# Patient Record
Sex: Male | Born: 1970 | Race: White | Hispanic: No | Marital: Married | Smoking: Former smoker
Health system: Southern US, Community
[De-identification: ages and names within clinical notes are randomized; demographics above are authoritative.]

## PROBLEM LIST (undated history)

## (undated) DIAGNOSIS — R569 Unspecified convulsions: Secondary | ICD-10-CM

## (undated) DIAGNOSIS — E669 Obesity, unspecified: Secondary | ICD-10-CM

## (undated) DIAGNOSIS — I10 Essential (primary) hypertension: Secondary | ICD-10-CM

---

## 2011-04-18 ENCOUNTER — Ambulatory Visit: Payer: Self-pay | Admitting: Family Medicine

## 2012-10-25 ENCOUNTER — Ambulatory Visit: Payer: Self-pay | Admitting: Family Medicine

## 2013-05-27 ENCOUNTER — Ambulatory Visit: Payer: Self-pay | Admitting: Family Medicine

## 2013-05-28 ENCOUNTER — Ambulatory Visit: Payer: Self-pay | Admitting: Family Medicine

## 2013-06-12 ENCOUNTER — Ambulatory Visit: Payer: Self-pay | Admitting: Family Medicine

## 2013-06-12 HISTORY — PX: ROTATOR CUFF REPAIR: SHX139

## 2013-07-14 ENCOUNTER — Ambulatory Visit: Payer: Self-pay | Admitting: Orthopedic Surgery

## 2013-07-14 LAB — APTT: Activated PTT: 29.8 secs (ref 23.6–35.9)

## 2013-07-14 LAB — BASIC METABOLIC PANEL
Anion Gap: 5 — ABNORMAL LOW (ref 7–16)
BUN: 9 mg/dL (ref 7–18)
CALCIUM: 8.7 mg/dL (ref 8.5–10.1)
Chloride: 107 mmol/L (ref 98–107)
Co2: 25 mmol/L (ref 21–32)
Creatinine: 0.98 mg/dL (ref 0.60–1.30)
EGFR (African American): >60
Glucose: 125 mg/dL — ABNORMAL HIGH (ref 65–99)
Osmolality: 274 (ref 275–301)
Potassium: 3.8 mmol/L (ref 3.5–5.1)
Sodium: 137 mmol/L (ref 136–145)

## 2013-07-14 LAB — CBC
HCT: 43.1 % (ref 40.0–52.0)
HGB: 14.4 g/dL (ref 13.0–18.0)
MCH: 31.2 pg (ref 26.0–34.0)
MCHC: 33.4 g/dL (ref 32.0–36.0)
MCV: 93 fL (ref 80–100)
Platelet: 337 10*3/uL (ref 150–440)
RBC: 4.62 10*6/uL (ref 4.40–5.90)
RDW: 14.3 % (ref 11.5–14.5)
WBC: 11.5 10*3/uL — ABNORMAL HIGH (ref 3.8–10.6)

## 2013-07-14 LAB — SEDIMENTATION RATE: ERYTHROCYTE SED RATE: 4 mm/h (ref 0–15)

## 2013-07-14 LAB — PROTIME-INR
INR: 0.8
PROTHROMBIN TIME: 11.5 s (ref 11.5–14.7)

## 2013-07-16 ENCOUNTER — Ambulatory Visit: Payer: Self-pay | Admitting: Orthopedic Surgery

## 2014-10-03 NOTE — Op Note (Signed)
PATIENT NAME:  Todd Villa, Todd Villa MR#:  [id number] DATE OF BIRTH:  [birth year]  DATE OF PROCEDURE:  07/16/2013  PREOPERATIVE DIAGNOSES: Left shoulder impingement, acromioclavicular joint arthrosis, and supraspinatus rotator cuff tear.   POSTOPERATIVE DIAGNOSES: Left shoulder impingement, acromioclavicular joint arthrosis, and supraspinatus rotator cuff tear.   PROCEDURES: Arthroscopic left shoulder subacromial decompression, distal clavicle excision, and mini open rotator cuff repair.   ANESTHESIA: General with interscalene block.   SURGEON: [place], M.D.   ESTIMATED BLOOD LOSS: Minimal.   COMPLICATIONS: None.   IMPLANTS: ArthroCare Opus Magnum 2 anchors x2, and Opus Magnum M anchors x2.   INDICATIONS FOR PROCEDURE: The patient is a 44 year old male who sustained an injury to his left shoulder. He had failed to improve with nonoperative management including physical therapy, corticosteroid injection, and prescription NSAIDs. His MRI was suggestive of a partial- versus full-thickness tear involving the supraspinatus. Given his failure to improve, the decision was made to proceed with arthroscopic evaluation and mini open rotator cuff repair.   The patient was made aware of the risks and benefits of surgery. He understands the risks include infection, bleeding, nerve or blood vessel injury, shoulder stiffness, persistent pain, re-tear of the rotator cuff, implant failure, and the need for further surgery. Medical risks include but are not limited to DVT and pulmonary embolism, myocardial infarction, stroke, pneumonia, respiratory failure and death. The patient understood these risks and wished to proceed.   PROCEDURE NOTE: The patient was met in the preoperative area. His wife was at the bedside. I marked the left shoulder with the word "yes" according to the hospital's right-site protocol after verbally confirming with the patient that this was the correct site of surgery. This was also  confirmed using my hospital notes and the radiographic studies. The patient's history and physical was updated.   He was brought to the operating room where he underwent a left interscalene block and then underwent general anesthesia. He was positioned in a beach chair position and a Spider arm holder was used for this case. Examination under anesthesia demonstrated full range of motion with no instability.   The patient was prepped and draped in a sterile fashion.   All bony prominences were adequately padded.   A timeout was performed to verify the patient's name, date of birth, medical record number, correct site of surgery, and correct procedure to be performed. It was also used to verify the patient had received antibiotics and all appropriate instruments, implants, and radiographic studies were available in the room. Once all in attendance were in agreement, the case began.   The patient's bony landmarks were drawn out with a surgical marker along with the proposed incisions. These were pre-injected with 1% lidocaine plain. An 11-blade was used to establish a posterior portal. The arthroscope was placed into the glenohumeral joint through this portal. An anterior portal was then established using the 18-gauge spinal needle for localization. Again, the 11-blade was used to make the portal incision and a 5.75 mm arthroscopic cannula was placed through the anterior portal.   A full diagnostic examination of the glenohumeral joint was undertaken. Findings on arthroscopy included fraying of the superior labrum with a stable biceps anchor. There was no tear of the biceps. There was a partial tear with fraying of some fibers of the subscapularis. There was a high-grade partial-thickness tear of the supraspinatus. The infraspinatus and teres minor were intact. The glenoid and humeral head chondral surfaces were intact and without evidence of chondral injury. The  labrum was intact. There were no HAGL  lesions or loose bodies in the inferior recess.   The superior labrum was debrided as was the subscapularis with a 4-0 resector shaver blade.   Then the attention was turned to debriding the frayed edges of the supraspinatus. An 18-gauge spinal needle was then placed through the tear and a 0 PDS suture was placed through the spinal needle to mark the tear to be identified on the bursal side. The arthroscope was then removed after the above-noted debridement.   The arthroscope was placed into the subacromial space. Significant bursitis was encountered. A lateral portal was established, again using an 18-gauge spinal needle for localization. A 4-0 resector shaver blade and a 90-degree ArthroCare wand were then used to debride the bursitis. The 0 PDS was then identified from the bursal side. The rotator cuff tear was debrided from the bursal side. A 5.5 mm resector shaver blade was then placed through the lateral portal. A subacromial decompression was performed. The 5.5 mm resector shaver blade was also used to perform a distal clavicle excision from the anterior portal. Once the distal clavicle excision and subacromial decompression were completed, the subacromial space was copiously irrigated and bony debris removed. An Community education officer stitch was then placed into the lateral aspect of the supraspinatus tear. All arthroscopic instruments were then removed.   A 15-blade was used to make a saber-type incision along the lateral border of the acromion. The subcutaneous tissues were dissected using Metzenbaum scissor and pick-up. The deltoid fascia was then identified and split in line with its fibers to reveal the underlying greater tuberosity and rotator cuff tear. A Smart stitch was brought out through the deltoid split. A second Smart stitch was then placed in the lateral border of the rotator cuff. Two ArthroCare Opus Magnum M  anchors were then placed at the articular margin with the humeral head for medial  row fixation. A single suture from each of these anchors was passed medially through the rotator cuff using a First-Pass. Once the 4 suture limbs were through the rotator cuff, they were clamped with a hemostat to be tied down later.   Two Magnum 2 anchors were loaded with the Smart stitch sutures and tightened to allow for excellent approximation of the lateral border of the rotator cuff along the greater tuberosity. Once the Magnum 2 anchors were in place and the sutures adequately tensioned, the Magnum M anchors were then tied down for double-row fixation. The wound was copiously irrigated. External pictures of the rotator cuff repair were then taken. The arthroscope was then placed back through the posterior portal and internal pictures of the rotator cuff tear were also taken.   The glenohumeral joint and saber incision were then copiously irrigated. The deltoid fascia was closed with a 0 interrupted suture. The subcutaneous tissue was closed with 2-0 Vicryl and the skin approximated with a running 4-0 Monocryl. The 3 portal incisions were closed with 4-0 nylon. Steri-Strips were applied. A subacromial injection of 0.25% Marcaine plain was then injected. The dry sterile dressing was applied along with leads for a TENS unit and Polar Care sleeve. The patient was placed in an abduction sling. He was then awakened and brought to the PACU in stable condition.   I was scrubbed and present for the entire case and all sharp and instrument counts were correct at the conclusion of the case. I spoke with the patient's wife in the postop consultation room to let her know that  the case had gone without complication and that her husband was stable in recovery room.    ____________________________ Timoteo Gaul, MD [id number] D: 07/23/2013 21:52:22 ET T: 07/23/2013 22:10:41 ET JOB#: [id number]  cc: Timoteo Gaul, MD, <Dictator> Timoteo Gaul MD ELECTRONICALLY SIGNED 07/24/2013 10:43

## 2015-11-26 ENCOUNTER — Ambulatory Visit: Payer: Self-pay | Admitting: Internal Medicine

## 2015-11-30 ENCOUNTER — Encounter: Payer: Self-pay | Admitting: Internal Medicine

## 2015-11-30 ENCOUNTER — Ambulatory Visit (INDEPENDENT_AMBULATORY_CARE_PROVIDER_SITE_OTHER): Payer: 59 | Admitting: Internal Medicine

## 2015-11-30 VITALS — BP 126/78 | HR 75 | Temp 98.4°F | Ht 74.5 in | Wt 332.0 lb

## 2015-11-30 DIAGNOSIS — F17200 Nicotine dependence, unspecified, uncomplicated: Secondary | ICD-10-CM

## 2015-11-30 DIAGNOSIS — Z720 Tobacco use: Secondary | ICD-10-CM

## 2015-11-30 DIAGNOSIS — M25512 Pain in left shoulder: Secondary | ICD-10-CM | POA: Diagnosis not present

## 2015-11-30 DIAGNOSIS — H938X3 Other specified disorders of ear, bilateral: Secondary | ICD-10-CM

## 2015-11-30 MED ORDER — VARENICLINE TARTRATE 0.5 MG X 11 & 1 MG X 42 PO MISC: ORAL | Status: DC

## 2015-11-30 MED ORDER — VARENICLINE TARTRATE 1 MG PO TABS: 1.0000 mg | ORAL_TABLET | Freq: Two times a day (BID) | ORAL | Status: DC

## 2015-11-30 NOTE — Patient Instructions (Signed)
Smoking Cessation, Tips for Success If you are ready to quit smoking, congratulations! You have chosen to help yourself be healthier. Cigarettes bring nicotine, tar, carbon monoxide, and other irritants into your body. Your lungs, heart, and blood vessels will be able to work better without these poisons. There are many different ways to quit smoking. Nicotine gum, nicotine patches, a nicotine inhaler, or nicotine nasal spray can help with physical craving. Hypnosis, support groups, and medicines help break the habit of smoking. WHAT THINGS CAN I DO TO MAKE QUITTING EASIER?  Here are some tips to help you quit for good:  Pick a date when you will quit smoking completely. Tell all of your friends and family about your plan to quit on that date.  Do not try to slowly cut down on the number of cigarettes you are smoking. Pick a quit date and quit smoking completely starting on that day.  Throw away all cigarettes.   Clean and remove all ashtrays from your home, work, and car.  On a card, write down your reasons for quitting. Carry the card with you and read it when you get the urge to smoke.  Cleanse your body of nicotine. Drink enough water and fluids to keep your urine clear or pale yellow. Do this after quitting to flush the nicotine from your body.  Learn to predict your moods. Do not let a bad situation be your excuse to have a cigarette. Some situations in your life might tempt you into wanting a cigarette.  Never have "just one" cigarette. It leads to wanting another and another. Remind yourself of your decision to quit.  Change habits associated with smoking. If you smoked while driving or when feeling stressed, try other activities to replace smoking. Stand up when drinking your coffee. Brush your teeth after eating. Sit in a different chair when you read the paper. Avoid alcohol while trying to quit, and try to drink fewer caffeinated beverages. Alcohol and caffeine may urge you to  smoke.  Avoid foods and drinks that can trigger a desire to smoke, such as sugary or spicy foods and alcohol.  Ask people who smoke not to smoke around you.  Have something planned to do right after eating or having a cup of coffee. For example, plan to take a walk or exercise.  Try a relaxation exercise to calm you down and decrease your stress. Remember, you may be tense and nervous for the first 2 weeks after you quit, but this will pass.  Find new activities to keep your hands busy. Play with a pen, coin, or rubber band. Doodle or draw things on paper.  Brush your teeth right after eating. This will help cut down on the craving for the taste of tobacco after meals. You can also try mouthwash.   Use oral substitutes in place of cigarettes. Try using lemon drops, carrots, cinnamon sticks, or chewing gum. Keep them handy so they are available when you have the urge to smoke.  When you have the urge to smoke, try deep breathing.  Designate your home as a nonsmoking area.  If you are a heavy smoker, ask your health care provider about a prescription for nicotine chewing gum. It can ease your withdrawal from nicotine.  Reward yourself. Set aside the cigarette money you save and buy yourself something nice.  Look for support from others. Join a support group or smoking cessation program. Ask someone at home or at work to help you with your plan   to quit smoking.  Always ask yourself, "Do I need this cigarette or is this just a reflex?" Tell yourself, "Today, I choose not to smoke," or "I do not want to smoke." You are reminding yourself of your decision to quit.  Do not replace cigarette smoking with electronic cigarettes (commonly called e-cigarettes). The safety of e-cigarettes is unknown, and some may contain harmful chemicals.  If you relapse, do not give up! Plan ahead and think about what you will do the next time you get the urge to smoke. HOW WILL I FEEL WHEN I QUIT SMOKING? You  may have symptoms of withdrawal because your body is used to nicotine (the addictive substance in cigarettes). You may crave cigarettes, be irritable, feel very hungry, cough often, get headaches, or have difficulty concentrating. The withdrawal symptoms are only temporary. They are strongest when you first quit but will go away within 10-14 days. When withdrawal symptoms occur, stay in control. Think about your reasons for quitting. Remind yourself that these are signs that your body is healing and getting used to being without cigarettes. Remember that withdrawal symptoms are easier to treat than the major diseases that smoking can cause.  Even after the withdrawal is over, expect periodic urges to smoke. However, these cravings are generally short lived and will go away whether you smoke or not. Do not smoke! WHAT RESOURCES ARE AVAILABLE TO HELP ME QUIT SMOKING? Your health care provider can direct you to community resources or hospitals for support, which may include:  Group support.  Education.  Hypnosis.  Therapy.   This information is not intended to replace advice given to you by your health care provider. Make sure you discuss any questions you have with your health care provider.   Document Released: 02/25/2004 Document Revised: 06/19/2014 Document Reviewed: 11/14/2012 Elsevier Interactive Patient Education 2016 Elsevier Inc.  

## 2015-11-30 NOTE — Progress Notes (Signed)
Pt presents to the clinic today to establish care. He is transferring care from Dr. Manuella Ghazi, who he has not seen in > 2 years.  He does c/o left shoulder pain s/p rotator cuff repair in 2015. He still has pain if he over exerts during the day or lays on it wrong. He denies numbness and tingling. He does not take anything for the pain.   He also wants something to help him quit smoking. He has tried and failed Wellbutrin in the past.  He also c/o bilateral ear fullness. 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 He has not tried anything for this.  Flu: sometimes Tetanus: 2015 Vision Screening: never Dentist: as needed   History reviewed. No pertinent past medical history.  Current Outpatient Prescriptions  Medication Sig Dispense Refill  . varenicline (CHANTIX CONTINUING MONTH PAK) 1 MG tablet Take 1 tablet (1 mg total) by mouth 2 (two) times daily. 60 tablet 0  . varenicline (CHANTIX STARTING MONTH PAK) 0.5 MG X 11 & 1 MG X 42 tablet Take one 0.5 mg tablet by mouth once daily for 3 days, then increase to one 0.5 mg tablet twice daily for 4 days, then increase to one 1 mg tablet twice daily. 53 tablet 0   No current facility-administered medications for this visit.    No Known Allergies  Family History  Problem Relation Age of Onset  . Hyperlipidemia Mother   . Heart disease Mother   . Hypertension Mother   . Diabetes Mother   . Alcohol abuse Father   . Heart disease Father   . Hypertension Father   . Diabetes Sister   . Cancer Neg Hx   . Stroke Neg Hx     Social History   Social History  . Marital Status: Married    Spouse Name: N/A  . Number of Children: N/A  . Years of Education: N/A   Occupational History  . Not on file.   Social History Main Topics  . Smoking status: Current Every Day Smoker -- 1.00 packs/day for 30 years    Types: Cigarettes  . Smokeless tobacco: Never Used  . Alcohol Use: No  . Drug Use: No   . Sexual Activity: Yes   Other Topics Concern  . Not on file   Social History Narrative  . No narrative on file    ROS:  Constitutional: Denies fever, malaise, fatigue, headache or abrupt weight changes.  HEENT: Pt reports ear fullness. Denies eye pain, eye redness, ear pain, ringing in the ears, wax buildup, runny nose, nasal congestion, bloody nose, or sore throat. Respiratory: Denies difficulty breathing, shortness of breath, cough or sputum production.   Cardiovascular: Denies chest pain, chest tightness, palpitations or swelling in the hands or feet.  Musculoskeletal: Pt reports left shoulder pain. Denies decrease in range of motion, difficulty with gait, muscle pain or joint swelling.  Skin: Denies redness, rashes, lesions or ulcercations.  Neurological: Denies dizziness, difficulty with memory, difficulty with speech or problems with balance and coordination.  Psych: Denies anxiety, depression, SI/HI.  No other specific complaints in a complete review of systems (except as listed in HPI above).  PE:  BP 126/78 mmHg  Pulse 75  Temp(Src) 98.4 F (36.9 C) (Oral)  Ht 6' 2.5" (1.892 m)  Wt 332 lb (150.594 kg)  BMI 42.07 kg/m2  SpO2 96% Wt Readings from Last 3 Encounters:  11/30/15 332 lb (150.594 kg)  General: Appears her stated age, obese in NAD. HEENT: Head: normal shape and size; Ears: Tm's gray and intact, normal light reflex, +serous effusion bilaterally;  Cardiovascular: Normal rate and rhythm. S1,S2 noted.  No murmur, rubs or gallops noted.  Pulmonary/Chest: Normal effort and positive vesicular breath sounds. No respiratory distress. No wheezes, rales or ronchi noted.  Musculoskeletal: Normal internal and external rotation of the left shoulder. Strength 5/5 BUE/BLE.   Neurological: Alert and oriented. Cranial nerves II-XII grossly intact. Coordination normal.  Psychiatric: Mood and affect normal. Behavior is normal. Judgment and thought content normal.      BMET    Component Value Date/Time   NA 137 07/14/2013 1031   K 3.8 07/14/2013 1031   CL 107 07/14/2013 1031   CO2 25 07/14/2013 1031   GLUCOSE 125* 07/14/2013 1031   BUN 9 07/14/2013 1031   CREATININE 0.98 07/14/2013 1031   CALCIUM 8.7 07/14/2013 1031   GFRNONAA >60 07/14/2013 1031   GFRAA >60 07/14/2013 1031    Lipid Panel  No results found for: CHOL, TRIG, HDL, CHOLHDL, VLDL, LDLCALC  CBC    Component Value Date/Time   WBC 11.5* 07/14/2013 1031   RBC 4.62 07/14/2013 1031   HGB 14.4 07/14/2013 1031   HCT 43.1 07/14/2013 1031   PLT 337 07/14/2013 1031   MCV 93 07/14/2013 1031   MCH 31.2 07/14/2013 1031   MCHC 33.4 07/14/2013 1031   RDW 14.3 07/14/2013 1031    Hgb A1C No results found for: HGBA1C   Assessment and Plan:  Smoker:  RX for Chantix starting and continuing month pack Handout given on smoking cessation tips for success  Ear fullness:  Flonase OTC  Left shoulder pain s/p rotator cuff repair:  Take Tylenol as needed  Make an appt for your annual exam Webb Silversmith, NP

## 2016-02-22 ENCOUNTER — Other Ambulatory Visit: Payer: Self-pay | Admitting: Internal Medicine

## 2016-02-22 NOTE — Telephone Encounter (Signed)
Last filled 11/30/15 along with starter pack--please advise

## 2016-04-10 ENCOUNTER — Encounter: Payer: 59 | Admitting: Internal Medicine

## 2016-04-11 ENCOUNTER — Encounter: Payer: Self-pay | Admitting: Internal Medicine

## 2016-04-11 ENCOUNTER — Ambulatory Visit (INDEPENDENT_AMBULATORY_CARE_PROVIDER_SITE_OTHER): Payer: 59 | Admitting: Internal Medicine

## 2016-04-11 VITALS — BP 126/82 | HR 99 | Temp 98.1°F | Ht 75.0 in | Wt 345.0 lb

## 2016-04-11 DIAGNOSIS — Z0000 Encounter for general adult medical examination without abnormal findings: Secondary | ICD-10-CM

## 2016-04-11 DIAGNOSIS — Z23 Encounter for immunization: Secondary | ICD-10-CM

## 2016-04-11 NOTE — Patient Instructions (Signed)

## 2016-04-11 NOTE — Addendum Note (Signed)
Addended by: Lurlean Nanny on: 04/11/2016 05:03 PM   Modules accepted: Orders

## 2016-04-11 NOTE — Progress Notes (Signed)
Subjective:    Patient ID: Todd Villa, male    DOB: 08-Sep-1970, 45 y.o.   MRN: [id number]  HPI  Pt presents to the clinic today for his annual exam.  Flu: he gets these sometimes Tetanus: 2015 Vision Screening: as needed Dentist: annually  Diet: He does eat meat. He consumes fruits and veggies daily. He does eat fried foods. He drinks mostly Mt. Dew, some water. Exercise: He walks his dog  Review of Systems      History reviewed. No pertinent past medical history.  Current Outpatient Prescriptions  Medication Sig Dispense Refill  . CHANTIX 1 MG tablet TAKE 1 TABLET BY MOUTH TWICE DAILY (Patient not taking: Reported on 04/11/2016) 56 tablet 0   No current facility-administered medications for this visit.     Allergies  Allergen Reactions  . Penicillins Other (See Comments)    Family History  Problem Relation Age of Onset  . Hyperlipidemia Mother   . Heart disease Mother   . Hypertension Mother   . Diabetes Mother   . Alcohol abuse Father   . Heart disease Father   . Hypertension Father   . Diabetes Sister   . Cancer Neg Hx   . Stroke Neg Hx     Social History   Social History  . Marital status: Married    Spouse name: N/A  . Number of children: N/A  . Years of education: N/A   Occupational History  . Not on file.   Social History Main Topics  . Smoking status: Current Every Day Smoker    Packs/day: 0.66    Years: 30.00    Types: Cigarettes  . Smokeless tobacco: Never Used  . Alcohol use No  . Drug use: No  . Sexual activity: Yes   Other Topics Concern  . Not on file   Social History Narrative  . No narrative on file     Constitutional: Denies fever, malaise, fatigue, headache or abrupt weight changes.  HEENT: Denies eye pain, eye redness, ear pain, ringing in the ears, wax buildup, runny nose, nasal congestion, bloody nose, or sore throat. Respiratory: Denies difficulty breathing, shortness of breath, cough or sputum production.    Cardiovascular: Denies chest pain, chest tightness, palpitations or swelling in the hands or feet.  Gastrointestinal: Pt reports occasional reflux. Denies abdominal pain, bloating, constipation, diarrhea or blood in the stool.  GU: Denies urgency, frequency, pain with urination, burning sensation, blood in urine, odor or discharge. Musculoskeletal: Denies decrease in range of motion, difficulty with gait, muscle pain or joint pain and swelling.  Skin: Denies redness, rashes, lesions or ulcercations.  Neurological: Denies dizziness, difficulty with memory, difficulty with speech or problems with balance and coordination.  Psych: Denies anxiety, depression, SI/HI.  No other specific complaints in a complete review of systems (except as listed in HPI above).  Objective:   Physical Exam   BP 126/82   Pulse 99   Temp 98.1 F (36.7 C) (Oral)   Ht 6\' 3"  (1.905 m)   Wt (!) 345 lb (156.5 kg)   SpO2 96%   BMI 43.12 kg/m  Wt Readings from Last 3 Encounters:  04/11/16 (!) 345 lb (156.5 kg)  11/30/15 (!) 332 lb (150.6 kg)    General: Appears his stated age, obese in NAD. Skin: Warm, dry and intact. No rashes, lesions or ulcerations noted. HEENT: Head: normal shape and size; Eyes: sclera white, no icterus, conjunctiva pink, PERRLA and EOMs intact; Ears: Tm's gray and intact,  normal light reflex; Throat/Mouth: Teeth present, mucosa pink and moist, no exudate, lesions or ulcerations noted.  Neck:  Neck supple, trachea midline. No masses, lumps or thyromegaly present.  Cardiovascular: Normal rate and rhythm. S1,S2 noted.  No murmur, rubs or gallops noted.  Pulmonary/Chest: Normal effort and positive vesicular breath sounds. No respiratory distress. No wheezes, rales or ronchi noted.  Abdomen: Soft and nontender. Normal bowel sounds. No distention or masses noted. Liver, spleen and kidneys non palpable. Musculoskeletal: Normal range of motion. Strength 5/5 BUE/BLE. No difficulty with gait.    Neurological: Alert and oriented. Cranial nerves II-XII grossly intact. Coordination normal.  Psychiatric: Mood and affect normal. Behavior is normal. Judgment and thought content normal.    BMET    Component Value Date/Time   NA 137 07/14/2013 1031   K 3.8 07/14/2013 1031   CL 107 07/14/2013 1031   CO2 25 07/14/2013 1031   GLUCOSE 125 (H) 07/14/2013 1031   BUN 9 07/14/2013 1031   CREATININE 0.98 07/14/2013 1031   CALCIUM 8.7 07/14/2013 1031   GFRNONAA >60 07/14/2013 1031   GFRAA >60 07/14/2013 1031    Lipid Panel  No results found for: CHOL, TRIG, HDL, CHOLHDL, VLDL, LDLCALC  CBC    Component Value Date/Time   WBC 11.5 (H) 07/14/2013 1031   RBC 4.62 07/14/2013 1031   HGB 14.4 07/14/2013 1031   HCT 43.1 07/14/2013 1031   PLT 337 07/14/2013 1031   MCV 93 07/14/2013 1031   MCH 31.2 07/14/2013 1031   MCHC 33.4 07/14/2013 1031   RDW 14.3 07/14/2013 1031    Hgb A1C No results found for: HGBA1C         Assessment & Plan:   Preventative Health Maintenance:  Flu shot today Tetanus UTD Encouraged him to consume a balanced diet and exercise regimen Advised him to see an eye doctor and dentist annually Will check CBC, CMET, Lipid, A1C  RTC in 1 year, sooner if needed Webb Silversmith, NP

## 2016-04-12 ENCOUNTER — Encounter: Payer: Self-pay | Admitting: Internal Medicine

## 2016-04-12 LAB — LIPID PANEL
CHOL/HDL RATIO: 5
CHOLESTEROL: 186 mg/dL (ref 0–200)
HDL: 40 mg/dL (ref >39.00)
NonHDL: 146.22
Triglycerides: 213 mg/dL — ABNORMAL HIGH (ref 0.0–149.0)
VLDL: 42.6 mg/dL — ABNORMAL HIGH (ref 0.0–40.0)

## 2016-04-12 LAB — LDL CHOLESTEROL, DIRECT: LDL DIRECT: 129 mg/dL

## 2016-04-12 LAB — COMPREHENSIVE METABOLIC PANEL
ALBUMIN: 4 g/dL (ref 3.5–5.2)
ALK PHOS: 53 U/L (ref 39–117)
ALT: 19 U/L (ref 0–53)
AST: 20 U/L (ref 0–37)
BUN: 10 mg/dL (ref 6–23)
CO2: 28 mEq/L (ref 19–32)
CREATININE: 0.91 mg/dL (ref 0.40–1.50)
Calcium: 9.5 mg/dL (ref 8.4–10.5)
Chloride: 104 mEq/L (ref 96–112)
GFR: 96.52 mL/min (ref >60.00)
GLUCOSE: 92 mg/dL (ref 70–99)
Potassium: 3.8 mEq/L (ref 3.5–5.1)
SODIUM: 141 meq/L (ref 135–145)
TOTAL PROTEIN: 7.2 g/dL (ref 6.0–8.3)
Total Bilirubin: 0.2 mg/dL (ref 0.2–1.2)

## 2016-04-12 LAB — CBC
HCT: 44 % (ref 39.0–52.0)
Hemoglobin: 14.5 g/dL (ref 13.0–17.0)
MCHC: 33.1 g/dL (ref 30.0–36.0)
MCV: 91.2 fl (ref 78.0–100.0)
Platelets: 384 10*3/uL (ref 150.0–400.0)
RBC: 4.82 Mil/uL (ref 4.22–5.81)
RDW: 15.1 % (ref 11.5–15.5)
WBC: 15.6 10*3/uL — AB (ref 4.0–10.5)

## 2016-04-12 LAB — HEMOGLOBIN A1C: HEMOGLOBIN A1C: 6 % (ref 4.6–6.5)

## 2016-05-31 ENCOUNTER — Encounter: Payer: Self-pay | Admitting: Internal Medicine

## 2016-05-31 ENCOUNTER — Telehealth: Payer: 59 | Admitting: Family

## 2016-05-31 DIAGNOSIS — J019 Acute sinusitis, unspecified: Secondary | ICD-10-CM

## 2016-05-31 MED ORDER — DOXYCYCLINE HYCLATE 100 MG PO TABS: 100.0000 mg | ORAL_TABLET | Freq: Two times a day (BID) | ORAL | 0 refills | Status: DC

## 2016-05-31 NOTE — Progress Notes (Signed)

## 2017-07-31 ENCOUNTER — Telehealth: Payer: 59 | Admitting: Family

## 2017-07-31 DIAGNOSIS — J40 Bronchitis, not specified as acute or chronic: Secondary | ICD-10-CM | POA: Diagnosis not present

## 2017-07-31 DIAGNOSIS — R05 Cough: Secondary | ICD-10-CM | POA: Diagnosis not present

## 2017-07-31 DIAGNOSIS — R059 Cough, unspecified: Secondary | ICD-10-CM

## 2017-07-31 MED ORDER — PREDNISONE 10 MG (21) PO TBPK: ORAL_TABLET | ORAL | 0 refills | Status: DC

## 2017-07-31 NOTE — Progress Notes (Signed)

## 2020-06-12 DIAGNOSIS — I639 Cerebral infarction, unspecified: Secondary | ICD-10-CM

## 2020-06-12 HISTORY — DX: Cerebral infarction, unspecified: I63.9

## 2020-07-05 ENCOUNTER — Other Ambulatory Visit: Payer: 59

## 2020-07-05 DIAGNOSIS — Z20822 Contact with and (suspected) exposure to covid-19: Secondary | ICD-10-CM

## 2020-07-06 LAB — NOVEL CORONAVIRUS, NAA: SARS-CoV-2, NAA: DETECTED — AB

## 2020-07-06 LAB — SARS-COV-2, NAA 2 DAY TAT

## 2020-08-13 ENCOUNTER — Other Ambulatory Visit (HOSPITAL_COMMUNITY): Payer: Self-pay | Admitting: Family Medicine

## 2020-08-13 ENCOUNTER — Other Ambulatory Visit: Payer: Self-pay | Admitting: Family Medicine

## 2020-08-13 DIAGNOSIS — R531 Weakness: Secondary | ICD-10-CM

## 2020-08-13 DIAGNOSIS — I10 Essential (primary) hypertension: Secondary | ICD-10-CM

## 2020-08-13 DIAGNOSIS — I6381 Other cerebral infarction due to occlusion or stenosis of small artery: Secondary | ICD-10-CM

## 2020-08-17 ENCOUNTER — Emergency Department
Admission: EM | Admit: 2020-08-17 | Discharge: 2020-08-17 | Disposition: A | Discharge status: Other Acute Inpatient Hospital | Payer: Commercial Managed Care - PPO | Attending: Emergency Medicine | Admitting: Emergency Medicine

## 2020-08-17 ENCOUNTER — Emergency Department: Payer: Commercial Managed Care - PPO

## 2020-08-17 ENCOUNTER — Other Ambulatory Visit: Payer: Self-pay

## 2020-08-17 ENCOUNTER — Encounter: Payer: Self-pay | Admitting: Emergency Medicine

## 2020-08-17 DIAGNOSIS — R296 Repeated falls: Secondary | ICD-10-CM | POA: Diagnosis not present

## 2020-08-17 DIAGNOSIS — F17210 Nicotine dependence, cigarettes, uncomplicated: Secondary | ICD-10-CM | POA: Insufficient documentation

## 2020-08-17 DIAGNOSIS — I639 Cerebral infarction, unspecified: Secondary | ICD-10-CM | POA: Diagnosis present

## 2020-08-17 DIAGNOSIS — D496 Neoplasm of unspecified behavior of brain: Secondary | ICD-10-CM | POA: Diagnosis not present

## 2020-08-17 DIAGNOSIS — Z20822 Contact with and (suspected) exposure to covid-19: Secondary | ICD-10-CM | POA: Insufficient documentation

## 2020-08-17 LAB — CBC WITH DIFFERENTIAL/PLATELET
Abs Immature Granulocytes: 0.16 10*3/uL — ABNORMAL HIGH (ref 0.00–0.07)
Basophils Absolute: 0.1 10*3/uL (ref 0.0–0.1)
Basophils Relative: 1 %
Eosinophils Absolute: 0.1 10*3/uL (ref 0.0–0.5)
Eosinophils Relative: 1 %
HCT: 45 % (ref 39.0–52.0)
Hemoglobin: 15.2 g/dL (ref 13.0–17.0)
Immature Granulocytes: 1 %
Lymphocytes Relative: 16 %
Lymphs Abs: 3.1 10*3/uL (ref 0.7–4.0)
MCH: 30.2 pg (ref 26.0–34.0)
MCHC: 33.8 g/dL (ref 30.0–36.0)
MCV: 89.5 fL (ref 80.0–100.0)
Monocytes Absolute: 1.3 10*3/uL — ABNORMAL HIGH (ref 0.1–1.0)
Monocytes Relative: 7 %
Neutro Abs: 14.3 10*3/uL — ABNORMAL HIGH (ref 1.7–7.7)
Neutrophils Relative %: 74 %
Platelets: 408 10*3/uL — ABNORMAL HIGH (ref 150–400)
RBC: 5.03 MIL/uL (ref 4.22–5.81)
RDW: 13.9 % (ref 11.5–15.5)
WBC: 19.1 10*3/uL — ABNORMAL HIGH (ref 4.0–10.5)
nRBC: 0 % (ref 0.0–0.2)

## 2020-08-17 LAB — COMPREHENSIVE METABOLIC PANEL
ALT: 17 U/L (ref 0–44)
AST: 19 U/L (ref 15–41)
Albumin: 4.2 g/dL (ref 3.5–5.0)
Alkaline Phosphatase: 77 U/L (ref 38–126)
Anion gap: 10 (ref 5–15)
BUN: 12 mg/dL (ref 6–20)
CO2: 25 mmol/L (ref 22–32)
Calcium: 9.1 mg/dL (ref 8.9–10.3)
Chloride: 106 mmol/L (ref 98–111)
Creatinine, Ser: 0.91 mg/dL (ref 0.61–1.24)
GFR, Estimated: >60 mL/min (ref >60)
Glucose, Bld: 111 mg/dL — ABNORMAL HIGH (ref 70–99)
Potassium: 3.7 mmol/L (ref 3.5–5.1)
Sodium: 141 mmol/L (ref 135–145)
Total Bilirubin: 1 mg/dL (ref 0.3–1.2)
Total Protein: 7.7 g/dL (ref 6.5–8.1)

## 2020-08-17 LAB — CBG MONITORING, ED: Glucose-Capillary: 109 mg/dL — ABNORMAL HIGH (ref 70–99)

## 2020-08-17 LAB — PROTIME-INR
INR: 0.9 (ref 0.8–1.2)
Prothrombin Time: 12.2 seconds (ref 11.4–15.2)

## 2020-08-17 LAB — RESP PANEL BY RT-PCR (FLU A&B, COVID) ARPGX2
Influenza A by PCR: NEGATIVE
Influenza B by PCR: NEGATIVE
SARS Coronavirus 2 by RT PCR: NEGATIVE

## 2020-08-17 LAB — APTT: aPTT: 26 seconds (ref 24–36)

## 2020-08-17 LAB — LACTIC ACID, PLASMA: Lactic Acid, Venous: 1.4 mmol/L (ref 0.5–1.9)

## 2020-08-17 LAB — TROPONIN I (HIGH SENSITIVITY): Troponin I (High Sensitivity): 4 ng/L (ref <18)

## 2020-08-17 MED ORDER — ACETAMINOPHEN 500 MG PO TABS
1000.0000 mg | ORAL_TABLET | ORAL | Status: AC
  Administered 2020-08-17: 1000 mg via ORAL
  Filled 2020-08-17: qty 2

## 2020-08-17 MED ORDER — SODIUM CHLORIDE 0.9 % IV BOLUS
1000.0000 mL | Freq: Once | INTRAVENOUS | Status: AC
  Administered 2020-08-17: 1000 mL via INTRAVENOUS

## 2020-08-17 MED ORDER — DEXAMETHASONE SODIUM PHOSPHATE 10 MG/ML IJ SOLN
10.0000 mg | Freq: Once | INTRAMUSCULAR | Status: AC
  Administered 2020-08-17: 10 mg via INTRAVENOUS
  Filled 2020-08-17: qty 1

## 2020-08-17 MED ORDER — SODIUM CHLORIDE 0.9 % IV SOLN: 1500.0000 mg | INTRAVENOUS | Status: DC

## 2020-08-17 MED ORDER — GADOBUTROL 1 MMOL/ML IV SOLN
10.0000 mL | Freq: Once | INTRAVENOUS | Status: AC | PRN
  Administered 2020-08-17: 10 mL via INTRAVENOUS

## 2020-08-17 MED ORDER — LEVETIRACETAM IN NACL 1000 MG/100ML IV SOLN
1000.0000 mg | Freq: Once | INTRAVENOUS | Status: AC
  Administered 2020-08-17: 1000 mg via INTRAVENOUS
  Filled 2020-08-17: qty 100

## 2020-08-17 NOTE — Consult Note (Signed)
Neurology Consultation Note  Consult Requested by: Dr. Jacqualine Code  Reason for Consult: worsening left sided weakness, fall with brief LOC  Consult Date: 08/17/20   The history was obtained from the wife.  During history and examination, all items were able to obtain unless otherwise noted.  History of Present Illness:  Todd Villa is a 50 y.o. Caucasian male with PMH of HTN, HLD, obesity, OSA, recent stroke presented to ED for worsening left sided weakness x 2 weeks, fall with brief LOC today.   Per wife, pt had "stroke" in January this year and treated at [facility]. I reviewed record, he was diagnosed with right BG infarct with small hemorrhagic conversion at that time based on CT head (no imaging to review only the report). He also had CUS and TTE which were not remarkable. He was discharged with ASA 81 and lipitor 80. NO MRI DONE. NO brain vascular imaging done per wife. He then followed with Dr. Manuella Ghazi at [facility] and worked with PT/OT. Per wife, pt seems getting better, left UE can life up but still no finger movement. LLE can walk with ankle brace.   However, for the last two weeks, his left sided weakness getting worse, gradually left UE not moving at all, left leg getting weaker and hardly able to walk. Also, more lethargic and fatigue then before. He did not come to ER instead called his PCP and ordered outpt CT but not done yet. Today he had two falls with briefly passing out at least at one occasion with the fall. Wife sent him to ER. In ER, his BP was low, initially 60s, later up to 80s and 100s with fluid and head of bed done.   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 Pt was seen by Dr. Lacinda Axon NSG, put on keppra and decadron and pt will be transferred to [facility].   History reviewed. No pertinent past medical history.  Past Surgical History:  Procedure Laterality Date  . ROTATOR CUFF REPAIR Left 2015    Family History  Problem Relation Age of  Onset  . Hyperlipidemia Mother   . Heart disease Mother   . Hypertension Mother   . Diabetes Mother   . Alcohol abuse Father   . Heart disease Father   . Hypertension Father   . Diabetes Sister   . Cancer Neg Hx   . Stroke Neg Hx     Social History:  reports that he has been smoking cigarettes. He has a 19.80 pack-year smoking history. He has never used smokeless tobacco. He reports that he does not drink alcohol and does not use drugs.  Allergies:  Allergies  Allergen Reactions  . Penicillins Other (See Comments)    No current facility-administered medications on file prior to encounter.   Current Outpatient Medications on File Prior to Encounter  Medication Sig Dispense Refill  . CHANTIX 1 MG tablet TAKE 1 TABLET BY MOUTH TWICE DAILY (Patient not taking: Reported on 04/11/2016) 56 tablet 0  . doxycycline (VIBRA-TABS) 100 MG tablet Take 1 tablet (100 mg total) by mouth 2 (two) times daily. 20 tablet 0  . predniSONE (STERAPRED UNI-PAK 21 TAB) 10 MG (21) TBPK tablet As directed 21 tablet 0    Review of Systems: A full ROS was attempted today and was able to be performed.  Systems assessed include - Constitutional, Eyes, HENT, Respiratory, Cardiovascular, Gastrointestinal, Genitourinary, Integument/breast, Hematologic/lymphatic, Musculoskeletal, Neurological, Behavioral/Psych, Endocrine, Allergic/Immunologic - with pertinent responses  as per HPI.  Physical Examination: Temp:  [98.1 F (36.7 C)] 98.1 F (36.7 C) (03/08 1321) Pulse Rate:  [87] 87 (03/08 1321) Resp:  [12-18] 16 (03/08 1345) BP: (102-158)/(79-101) 158/101 (03/08 1321) SpO2:  [96 %-100 %] 100 % (03/08 1321) Weight:  [148.3 kg] 148.3 kg (03/08 0957)  General - well nourished, well developed, in no apparent distress, mildly sleepy.    Ophthalmologic - fundi not visualized due to noncooperation.    Cardiovascular - regular rhythm and rate.  Mental Status -  Mildly sleepy but easily arousable. Orientation to  time, place, and person were intact. Language including expression, naming, repetition, comprehension was assessed and found intact.  Cranial Nerves II - XII - II - Vision intact OU. III, IV, VI - Extraocular movements intact. V - Facial sensation on the left diminished. VII - left facial droop VIII - Hearing & vestibular intact bilaterally. X - Palate elevates symmetrically. XI - Chin turning & shoulder shrug intact bilaterally. XII - Tongue protrusion intact.  Motor Strength - The patient's strength was normal in right upper and lower extremities, but left UE flaccid, left LE proximal 4+/5 and distal ankle toe DF/PF 0/5. Left ankle on brace   Motor Tone & Bulk - Muscle tone was assessed at the neck and appendages and was normal.  Bulk was normal and fasciculations were absent.   Reflexes - The patient's reflexes were decreased on the left and he had no pathological reflexes.  Sensory - Light touch, temperature/pinprick were assessed and were diminished on the left.    Coordination - The patient had normal movements in the right hand with no ataxia or dysmetria.  Tremor was absent.  Gait and Station - deferred  Data Reviewed: CT Head Wo Contrast  Result Date: 08/17/2020 CLINICAL DATA:  Recent falls. EXAM: CT HEAD WITHOUT CONTRAST CT CERVICAL SPINE WITHOUT CONTRAST TECHNIQUE: Multidetector CT imaging of the head and cervical spine was performed following the standard protocol without intravenous contrast. Multiplanar CT image reconstructions of the cervical spine were also generated. COMPARISON:  None. FINDINGS: CT HEAD FINDINGS Brain: Large mass lesion in the right posterior frontal lobe measuring approximately 4.1 x 4.6 cm. Large amount of surrounding vasogenic edema in the white matter. Local mass-effect on the right lateral ventricle. 6 mm midline shift to the left. The mass is approximately isodense to brain. No definite hemorrhage. No second lesion identified. Ventricle size not  enlarged. Vascular: Negative for hyperdense vessel Skull: Negative Sinuses/Orbits: Mucosal edema paranasal sinuses.  Negative orbit Other: None CT CERVICAL SPINE FINDINGS Alignment: Normal Skull base and vertebrae: Negative for fracture or mass lesion. Soft tissues and spinal canal: Negative for soft tissue mass Disc levels: Disc degeneration and spurring at C5-6 with foraminal and spinal stenosis due to spurring. Upper chest: Lung apices clear bilaterally. Other: None IMPRESSION: 1. Large mass lesion in the right posterior frontal lobe with extensive surrounding edema. There is mass-effect and 6 mm midline shift. No hemorrhage. 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 Recommend MRI brain without with contrast 2. Negative for cervical fracture. Cervical spondylosis most notably at C5-6. 3. These results were called by telephone at the time of interpretation on 08/17/2020 at 11:29 am to provider MARK QUALE , who verbally acknowledged these results. Electronically Signed   By: Franchot Gallo M.D.   On: 08/17/2020 11:31   CT Cervical Spine Wo Contrast  Result Date: 08/17/2020 CLINICAL DATA:  Recent falls. EXAM: CT HEAD WITHOUT CONTRAST CT  CERVICAL SPINE WITHOUT CONTRAST TECHNIQUE: Multidetector CT imaging of the head and cervical spine was performed following the standard protocol without intravenous contrast. Multiplanar CT image reconstructions of the cervical spine were also generated. COMPARISON:  None. FINDINGS: CT HEAD FINDINGS Brain: Large mass lesion in the right posterior frontal lobe measuring approximately 4.1 x 4.6 cm. Large amount of surrounding vasogenic edema in the white matter. Local mass-effect on the right lateral ventricle. 6 mm midline shift to the left. The mass is approximately isodense to brain. No definite hemorrhage. No second lesion identified. Ventricle size not enlarged. Vascular: Negative for hyperdense vessel Skull: Negative Sinuses/Orbits:  Mucosal edema paranasal sinuses.  Negative orbit Other: None CT CERVICAL SPINE FINDINGS Alignment: Normal Skull base and vertebrae: Negative for fracture or mass lesion. Soft tissues and spinal canal: Negative for soft tissue mass Disc levels: Disc degeneration and spurring at C5-6 with foraminal and spinal stenosis due to spurring. Upper chest: Lung apices clear bilaterally. Other: None IMPRESSION: 1. Large mass lesion in the right posterior frontal lobe with extensive surrounding edema. There is mass-effect and 6 mm midline shift. No hemorrhage. 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 Recommend MRI brain without with contrast 2. Negative for cervical fracture. Cervical spondylosis most notably at C5-6. 3. These results were called by telephone at the time of interpretation on 08/17/2020 at 11:29 am to provider MARK QUALE , who verbally acknowledged these results. Electronically Signed   By: Franchot Gallo M.D.   On: 08/17/2020 11:31   MR Brain W and Wo Contrast  Result Date: 08/17/2020 CLINICAL DATA:  Brain mass or lesion. EXAM: MRI HEAD WITHOUT AND WITH CONTRAST TECHNIQUE: Multiplanar, multiecho pulse sequences of the brain and surrounding structures were obtained without and with intravenous contrast. CONTRAST:  8mL GADAVIST GADOBUTROL 1 MMOL/ML IV SOLN COMPARISON:  Head CT August 17, 2020. FINDINGS: Brain: Large heterogeneous mass lesion centered in the deep right frontoparietal region extending into the right basal ganglia region. The lesion is multilobulated with central necrosis and peripheral irregular contrast enhancement and measures approximately 4 x 3.8 x 3.6 cm (AP, T, cc). Foci of restricted diffusion within the tumor suggests hypercellularity. There is prominent surrounding T2 hyperintensity consistent with vasogenic edema with possible tumor extension. T2 hyperintensity extending inferiorly along the cortical spinal tract in the posterior limb of  internal capsule and right cerebral peduncle. There is mass effect with effacement of the adjacent cerebral sulci, partial effacement of the left lateral ventricle and a 7 mm leftward midline shift. No acute infarct, hydrocephalus or extra-axial collection. Vascular: Normal flow voids. Skull and upper cervical spine: Normal marrow signal. Sinuses/Orbits: Prominence of the optic nerve sheath with flattening of the optic discs suggestive of increased intracranial pressure. Mild mucosal thickening of the bilateral maxillary sinuses. Other: Mild bilateral mastoid effusion. IMPRESSION: 1. Large heterogeneous mass lesion centered in the deep right frontoparietal region extending into the right basal ganglia region, measuring approximately 4 x 3.8 x 3.6 cm (AP, T, cc). Finding are concerning for primary high-grade neoplasm versus metastasis. 2. Prominent surrounding vasogenic edema with possible tumor extension causing effacement of the adjacent cerebral sulci, partial effacement of the left lateral ventricle and a 7 mm leftward midline shift. 3. Prominence of the optic nerve sheath with flattening of the optic discs suggestive of increased intracranial pressure. Electronically Signed   By: Pedro Earls M.D.   On: 08/17/2020 13:03    Assessment: 50 y.o. male PMH of HTN, HLD, obesity,  OSA, recent "stroke" presented to ED for worsening left sided weakness x 2 weeks, fall with brief LOC today. pt diagnosed with "stroke" in January this year at [facility]. I reviewed record, he was diagnosed with right BG infarct with small hemorrhagic conversion at that time based on CT head (no imaging to review only the report). He also had CUS and TTE which were not remarkable. He was discharged with ASA 81 and lipitor 80. NO MRI DONE. NO brain vascular imaging done per wife. He then followed with Dr. Manuella Ghazi at [facility] and worked with PT/OT. Per wife, pt seems getting better, left UE can life up but still no  finger movement. LLE can walk with ankle brace.   However, for the last two weeks, his left sided weakness getting worse, gradually left UE not moving at all, left leg getting weaker and hardly able to walk. Also, more lethargic and fatigue then before. Had two falls with briefly passing out at least at one occasion with the fall. In ER, his BP was low, initially 60s, later up to 80s and 100s with fluid and head of bed done.   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 Pt was seen by Dr. Lacinda Axon NSG, put on keppra and decadron and pt will be transferred to [facility].   Plan: - agree with keppra and decadron - agree with transfer to [facility] for further care - hypotension management per EDP  - discussed with EDP Dr. Hughie Closs and Dr. Lacinda Axon  Thank you for this consultation and allowing [place] to participate in the care of this patient.   Rosalin Hawking, MD PhD Stroke Neurology 08/17/2020 2:40 PM

## 2020-08-17 NOTE — ED Notes (Signed)
[facility] here to transport to [facility] ED. Consent signed by wife per verbal authorization of patient. Report given to Minna Merritts at [employer]

## 2020-08-17 NOTE — ED Notes (Signed)
Report given to Currie Paris at [facility] ED

## 2020-08-17 NOTE — ED Triage Notes (Signed)
Patient presents to the ED with frequent falls in the past 2-3 days.  Per patient's wife, patient has a broken left toe and ankle sprain and has had 2 strokes in the past 3 months.  During triage, patient became unresponsive, began drooling and shaking slightly and broke out in a sweat.  Patient was unresponsive for approx. 1-2 min.

## 2020-08-17 NOTE — ED Provider Notes (Addendum)
[facility] Emergency Department Provider Note   ____________________________________________   Event Date/Time   First MD Initiated Contact with Patient 08/17/20 1053     (approximate)  I have reviewed the triage vital signs and the nursing notes.   HISTORY  Chief Complaint Fall    HPI Todd Villa is a 50 y.o. male history of an ischemic stroke in roughly January.  Patient has some mild weakness on his left side since having an ischemic stroke.  However a week ago was noticed to have increased weakness over his left side nearly paralyzed in the left arm and left leg, he did not wish to go to the ER and instead consulted with his primary doctor and they had ordered a CT of the head but this has not been completed yet.  Wife reports that today he fell twice, he seems very weak difficulty with mobility for the last 1 week.  No sudden change in the last week except for he just seems to be getting more more fatigued.  He also seems a little bit sleepy today.  Patient does report a moderate headache.  And did fall twice today once possibly with seizure-like activity here at the emergency department.  He did urinate on himself   History reviewed. No pertinent past medical history.  There are no problems to display for this patient.   Past Surgical History:  Procedure Laterality Date  . ROTATOR CUFF REPAIR Left 2015    Prior to Admission medications   Medication Sig Start Date End Date Taking? Authorizing Provider  CHANTIX 1 MG tablet TAKE 1 TABLET BY MOUTH TWICE DAILY Patient not taking: Reported on 04/11/2016 02/22/16   Jearld Fenton, NP  doxycycline (VIBRA-TABS) 100 MG tablet Take 1 tablet (100 mg total) by mouth 2 (two) times daily. 05/31/16   Sharion Balloon, FNP  predniSONE (STERAPRED UNI-PAK 21 TAB) 10 MG (21) TBPK tablet As directed 07/31/17   Dutch Quint B, FNP    Allergies Penicillins  Family History  Problem Relation Age of  Onset  . Hyperlipidemia Mother   . Heart disease Mother   . Hypertension Mother   . Diabetes Mother   . Alcohol abuse Father   . Heart disease Father   . Hypertension Father   . Diabetes Sister   . Cancer Neg Hx   . Stroke Neg Hx     Social History Social History   Tobacco Use  . Smoking status: Current Every Day Smoker    Packs/day: 0.66    Years: 30.00    Pack years: 19.80    Types: Cigarettes  . Smokeless tobacco: Never Used  Substance Use Topics  . Alcohol use: No    Alcohol/week: 0.0 standard drinks  . Drug use: No    Review of Systems Constitutional: No fever/chills Eyes: No visual changes. ENT: No sore throat.  No neck pain. Cardiovascular: Denies chest pain. Respiratory: Denies shortness of breath. Gastrointestinal: No abdominal pain.   Genitourinary: Negative for dysuria. Musculoskeletal: Negative for back pain. Skin: Negative for rash.  Got very sweaty after the second episode here at the ER where he briefly passed out or had a seizure. Neurological: See HPI    ____________________________________________   PHYSICAL EXAM:  VITAL SIGNS: ED Triage Vitals  Enc Vitals Group     BP 08/17/20 0956 124/79     Pulse Rate 08/17/20 0956 87     Resp 08/17/20 0956 18     Temp 08/17/20  0956 98.1 F (36.7 C)     Temp Source 08/17/20 0956 Oral     SpO2 08/17/20 0956 96 %     Weight 08/17/20 0957 (!) 327 lb (148.3 kg)     Height 08/17/20 0957 6\' 3"  (1.905 m)     Head Circumference --      Peak Flow --      Pain Score 08/17/20 1041 0     Pain Loc --      Pain Edu? --      Excl. in [place]? --     Constitutional: Alert and oriented.  He is somewhat somnolent, but opens eyes conversant and oriented to place wife.  Does not recall having passed out or nearly passed out or possibly having a seizure while getting ready to use the bathroom at triage Eyes: Conjunctivae are normal. Head: Atraumatic.  No cervical pain or tenderness Nose: No  congestion/rhinnorhea. Mouth/Throat: Mucous membranes are slightly dry. Neck: No stridor.  Cardiovascular: Normal rate, regular rhythm. Grossly normal heart sounds.  Good peripheral circulation. Respiratory: Normal respiratory effort.  No retractions. Lungs CTAB. Gastrointestinal: Soft and nontender. No distention. Musculoskeletal: No lower extremity tenderness nor edema. Neurologic:  Normal speech and language.  Patient has weakness 1 out of 5 strength in left arm and left leg.  This is been reportedly present for about 1 week and did have existing weakness from previous stroke in this area but is notably worse. Skin:  Skin is warm, diaphoretic and intact. No rash noted. Psychiatric: Mood and affect are normal. Speech and behavior are normal.  ____________________________________________   LABS (all labs ordered are listed, but only abnormal results are displayed)  Labs Reviewed  CBC WITH DIFFERENTIAL/PLATELET - Abnormal; Notable for the following components:      Result Value   WBC 19.1 (*)    Platelets 408 (*)    Neutro Abs 14.3 (*)    Monocytes Absolute 1.3 (*)    Abs Immature Granulocytes 0.16 (*)    All other components within normal limits  COMPREHENSIVE METABOLIC PANEL - Abnormal; Notable for the following components:   Glucose, Bld 111 (*)    All other components within normal limits  CBG MONITORING, ED - Abnormal; Notable for the following components:   Glucose-Capillary 109 (*)    All other components within normal limits  SARS CORONAVIRUS 2 (TAT 6-24 HRS)  RESP PANEL BY RT-PCR (FLU A&B, COVID) ARPGX2  LACTIC ACID, PLASMA  PROTIME-INR  APTT  URINALYSIS, COMPLETE (UACMP) WITH MICROSCOPIC  TROPONIN I (HIGH SENSITIVITY)   ____________________________________________  EKG  Reviewed entered by me at 1040 Heart rate 70 QRS 110 QTc 420 Normal sinus rhythm, no evidence of acute ischemia. ____________________________________________  RADIOLOGY  CT Head Wo  Contrast  Result Date: 08/17/2020 CLINICAL DATA:  Recent falls. EXAM: CT HEAD WITHOUT CONTRAST CT CERVICAL SPINE WITHOUT CONTRAST TECHNIQUE: Multidetector CT imaging of the head and cervical spine was performed following the standard protocol without intravenous contrast. Multiplanar CT image reconstructions of the cervical spine were also generated. COMPARISON:  None. FINDINGS: CT HEAD FINDINGS Brain: Large mass lesion in the right posterior frontal lobe measuring approximately 4.1 x 4.6 cm. Large amount of surrounding vasogenic edema in the white matter. Local mass-effect on the right lateral ventricle. 6 mm midline shift to the left. The mass is approximately isodense to brain. No definite hemorrhage. No second lesion identified. Ventricle size not enlarged. Vascular: Negative for hyperdense vessel Skull: Negative Sinuses/Orbits: Mucosal edema paranasal sinuses.  Negative  orbit Other: None CT CERVICAL SPINE FINDINGS Alignment: Normal Skull base and vertebrae: Negative for fracture or mass lesion. Soft tissues and spinal canal: Negative for soft tissue mass Disc levels: Disc degeneration and spurring at C5-6 with foraminal and spinal stenosis due to spurring. Upper chest: Lung apices clear bilaterally. Other: None IMPRESSION: 1. Large mass lesion in the right posterior frontal lobe with extensive surrounding edema. There is mass-effect and 6 mm midline shift. No hemorrhage. 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 Recommend MRI brain without with contrast 2. Negative for cervical fracture. Cervical spondylosis most notably at C5-6. 3. These results were called by telephone at the time of interpretation on 08/17/2020 at 11:29 am to provider Christie Viscomi , who verbally acknowledged these results. Electronically Signed   By: Franchot Gallo M.D.   On: 08/17/2020 11:31   CT Cervical Spine Wo Contrast  Result Date: 08/17/2020 CLINICAL DATA:  Recent falls. EXAM: CT HEAD  WITHOUT CONTRAST CT CERVICAL SPINE WITHOUT CONTRAST TECHNIQUE: Multidetector CT imaging of the head and cervical spine was performed following the standard protocol without intravenous contrast. Multiplanar CT image reconstructions of the cervical spine were also generated. COMPARISON:  None. FINDINGS: CT HEAD FINDINGS Brain: Large mass lesion in the right posterior frontal lobe measuring approximately 4.1 x 4.6 cm. Large amount of surrounding vasogenic edema in the white matter. Local mass-effect on the right lateral ventricle. 6 mm midline shift to the left. The mass is approximately isodense to brain. No definite hemorrhage. No second lesion identified. Ventricle size not enlarged. Vascular: Negative for hyperdense vessel Skull: Negative Sinuses/Orbits: Mucosal edema paranasal sinuses.  Negative orbit Other: None CT CERVICAL SPINE FINDINGS Alignment: Normal Skull base and vertebrae: Negative for fracture or mass lesion. Soft tissues and spinal canal: Negative for soft tissue mass Disc levels: Disc degeneration and spurring at C5-6 with foraminal and spinal stenosis due to spurring. Upper chest: Lung apices clear bilaterally. Other: None IMPRESSION: 1. Large mass lesion in the right posterior frontal lobe with extensive surrounding edema. There is mass-effect and 6 mm midline shift. No hemorrhage. 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 Recommend MRI brain without with contrast 2. Negative for cervical fracture. Cervical spondylosis most notably at C5-6. 3. These results were called by telephone at the time of interpretation on 08/17/2020 at 11:29 am to provider Darril Patriarca , who verbally acknowledged these results. Electronically Signed   By: Franchot Gallo M.D.   On: 08/17/2020 11:31    CT imaging discussed with Dr. Lacinda Axon of neurosurgery as well as with Dr. Carlis Abbott radiologist.  Critical finding.  Summarized as concerning for a large mass lesion, no hemorrhage noted.   MRI recommended ____________________________________________   PROCEDURES  Procedure(s) performed: None  Procedures  Critical Care performed: Yes, see critical care note(s)  CRITICAL CARE Performed by: Delman Kitten   Total critical care time: 40 minutes  Critical care time was exclusive of separately billable procedures and treating other patients.  Critical care was necessary to treat or prevent imminent or life-threatening deterioration.  Critical care was time spent personally by me on the following activities: development of treatment plan with patient and/or surrogate as well as nursing, discussions with consultants, evaluation of patient's response to treatment, examination of patient, obtaining history from patient or surrogate, ordering and performing treatments and interventions, ordering and review of laboratory studies, ordering and review of radiographic studies, pulse oximetry and re-evaluation of patient's condition.  ____________________________________________  INITIAL IMPRESSION / ASSESSMENT AND PLAN / ED COURSE  Pertinent labs & imaging results that were available during my care of the patient were reviewed by me and considered in my medical decision making (see chart for details).   Patient presents for increasing weakness over the left side for about 1 week.  Headache.  2 falls one here in the ER that associated with possible presyncopal or seizure-like activity.  Initially very diaphoretic hypotensive, responded quickly to fluid bolus.  Etiology was initially unclear but high concern for acute neurologic process, CT of the head revealing of what very well may be a large mass lesion tumor cancer, or less likely per radiologist and neurosurgery hemorrhagic lesion from previous stroke.  Dr. Lacinda Axon and Dr. Erlinda Hong providing consultation.  Keppra as well as Decadron ordered as recommended by neurosurgery and neurology.  MRI stat  ordered.   ----------------------------------------- 1:48 PM on 08/17/2020 -----------------------------------------  Patient has been accepted to [facility] in ER to ER transfer to the service of neurosurgery, Dr. Cathleen Fears excepting.  The patient is presently resting comfortably.  Vital signs reassuring.  No further seizure-like activity.  He feels improved, resting alert well oriented without distress.  Wife also at the bedside both patient and his wife agreeable with plan to transfer to [facility].  Patient transferred to [facility] due to advanced neurosurgical capabilities beyond the scope of this hospital, recommendation to transfer to [facility] by our neurosurgical specialist Dr. Lacinda Axon       ____________________________________________   FINAL CLINICAL IMPRESSION(S) / ED DIAGNOSES  Final diagnoses:  Neoplasm of brain causing mass effect on adjacent structures [facility])        Note:  This document was prepared using Dragon voice recognition software and may include unintentional dictation errors       Delman Kitten, MD 08/17/20 1349   ----------------------------------------- 2:54 PM on 08/17/2020 -----------------------------------------  Patient resting, stable condition.  Reports moderate right-sided headache.  Discussed with him pain options, discussed mild to moderate doses of pain medications.  Patient indicates that he has been having headache like this for about a week and that Tylenol has generally been effective, would like to try Tylenol.  Patient remained stable, stable for transfer to [facility].  [facility] team coming to pick him up    Delman Kitten, MD 08/17/20 1455

## 2020-08-17 NOTE — ED Notes (Signed)
Patient urinated in bed. Cleaned patient up with assistance of his wife. Extrenal catheter placed.

## 2020-08-20 ENCOUNTER — Ambulatory Visit: Payer: Commercial Managed Care - PPO

## 2020-08-26 ENCOUNTER — Encounter: Payer: Self-pay | Admitting: Physical Therapy

## 2020-08-26 NOTE — PMR Pre-admission (Signed)
PMR Admission Coordinator Pre-Admission Assessment  Patient: Todd Villa is an 50 y.o., male MRN: [id number] DOB: 1970/09/12 Height: 6\' 3"  (1.905 m) Weight: (!) 148.3 kg  Insurance Information HMO:     PPO: yes     PCP:     IPA:      80/20:      OTHER:  PRIMARY: UMR      Policy#: [id number]      Subscriber: pt CM Name: Lattie Haw      Phone#: [phone]     Fax#: [phone] Pre-Cert#: [id number] Josem Kaufmann for CIR provided by Lattie Haw with UMR with updates due to fax listed above on 3/25 Employer: [employer] Benefits:  Phone #: [phone]     Name:  Eff. Date: 06/12/20     Deduct: $4000 (met $80.01)      Out of Pocket Max: [phone] ($182.01 met)      Life Max: n/a CIR: 100% once deductible met      SNF: 100% once deductible met Outpatient:      Co-Pay: $60 copay until deductible met, then 100% Home Health: 100%      Co-Pay:  DME: 100%     Co-Pay:  Providers:  SECONDARY:       Policy#:       Phone#:   Development worker, community:       Phone#:   The "Data Collection Information Summary" for patients in Inpatient Rehabilitation Facilities with attached "Privacy Act [place] Records" was provided and verbally reviewed with: N/A  Emergency Contact Information Contact Information    Name Relation Home Work [place] [phone]  [phone]      Current Medical History  Patient Admitting Diagnosis: brain mass  History of Present Illness: Pt is a 50 y/o male with recent admission to [facility] in January of 2022 where he was diagnosed with a R basal ganglia CVA and d/c'd home with family support.  He presented to [facility] again per his PCP direction on 3/8 with worsening symptoms.  Imaging revealed R frontal intracranial mass with significant edema and 7 mm midline shift.  On admit to [facility] pt afebrile, hemodynamically stable, and labs only notable for elevated WBC 19, though he had received 10 mg of decadron 6 hrs prior.  Pt endorsed dizziness, falls,  throbbing headaches that have worsened over the week prior to admission, as well as new onset incontinence B/B.  Pt was transferred to [facility] on 3/8 for planned resection.  PMH of HTN, CVA, and OSA.  Pt underwent tumor resection on 3/10 per Dr. 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  Post op course complicated by hypotension, ABLA, and increased somnolence on POD 1, imaging showing increased edema and midline shift, but improved with increased dose of decadron. Post operatively also noted to have black, tarry stool x2 with significant Hgb drop and GI workup confirmed bleeding stomach vessel, which was clipped.  Hgb now stable.  Pt tolerating a regular diet, though does present with some cognitive deficits.  PT/OT evaluations were completed and pt was recommended for a comprehensive inpatient rehabilitation program.      Patient's medical record from [place] has been reviewed by the rehabilitation admission coordinator and physician.  Past Medical History  No past medical history on file.  Family History   family history includes Alcohol abuse in his father; Diabetes in his mother and sister; Heart disease in his father and mother; Hyperlipidemia in his mother; Hypertension in his  father and mother.  Prior Rehab/Hospitalizations Has the patient had prior rehab or hospitalizations prior to admission? Yes  Has the patient had major surgery during 100 days prior to admission? Yes   Current Medications  Current Outpatient Medications:  .  CHANTIX 1 MG tablet, TAKE 1 TABLET BY MOUTH TWICE DAILY (Patient not taking: Reported on 04/11/2016), Disp: 56 tablet, Rfl: 0 .  doxycycline (VIBRA-TABS) 100 MG tablet, Take 1 tablet (100 mg total) by mouth 2 (two) times daily., Disp: 20 tablet, Rfl: 0 .  predniSONE (STERAPRED UNI-PAK 21 TAB) 10 MG (21) TBPK tablet, As directed, Disp: 21 tablet, Rfl: 0  Patients Current Diet: Regular Diet, thin liquids  Precautions / Restrictions      Has the patient had 2 or more falls or a fall with injury in the past year? Yes  Prior Activity Level Community (5-7x/wk): prior to hospitalization in January was independent and working FT for an [employer], applications.  Driving, no DME.  Since hospitalization in january has been requiring overall min assist, with recent decline 2/2 vasogenic edema and midline shift 2/2 mass.   Prior Functional Level Self Care: Did the patient need help bathing, dressing, using the toilet or eating? Independent  Indoor Mobility: Did the patient need assistance with walking from room to room (with or without device)? Independent  Stairs: Did the patient need assistance with internal or external stairs (with or without device)? Independent  Functional Cognition: Did the patient need help planning regular tasks such as shopping or remembering to take medications? Independent  Home Assistive Devices / Herbalist (specify type); Cane (specify quad or straight); Shower chair with back; Bedside commode/3-in-1 (RW, SPC, gait belt)   Prior Device Use: Indicate devices/aids used by the patient prior to current illness, exacerbation or injury? immediately PTA using cane/walker, but prior to CVA in January was completely independent   Prior Functional Level Current Functional Level  Bed Mobility  Independent  min to mod assist   Transfers  Independent   mod assist scoot transfer   Mobility - Walk/Wheelchair  Independent    Scoot transfer to chair  Upper Body Dressing  Independent   min   Lower Body Dressing  Independent    total  Grooming  Independent    Mod with verbal cues  Eating/Drinking  Independent   min   Toilet Transfer  Independent   mod   Bladder Continence   Continent   continent   Bowel Management  Continent    continent  Stair Climbing  Independent   not attempted   Communication  Independent   min   Memory  Independent  cognitive deficits noted      Special Needs/ Care Considerations CPAP, Skin surgical incision to scalp, Designated visitor Victorio Palm, Bowel management continent and Bladder management continent  Previous Home Environment  Living Arrangements: Spouse/significant other Available Help at Discharge: Family; Available 24 hours/day Type of Home: House Home Layout: Able to live on main level with bedroom/bathroom Home Access: Stairs to enter Entrance Stairs-Rails: None Entrance Stairs-Number of Steps: 2SE through garage, 3SE through front Bathroom Shower/Tub: Chiropodist: Standard Bathroom Accessibility: Yes How Accessible: Accessible via walker [place]: Yes (home health therapy) Type of Home Care Services: Home OT; Home RN; Home PT Additional Comments: Family was providing 24/7 assist since d/c from [facility] in January 2022   Discharge Living Setting Plans for Discharge Living Setting: Patient's home Type of Home at Discharge: House Discharge Home Layout:  Able to live on main level with bedroom/bathroom Discharge Home Access: Stairs to enter Entrance Stairs-Rails: None Entrance Stairs-Number of Steps: 2SE through garage, 3SE through front Discharge Bathroom Shower/Tub: Tub/shower unit Discharge Bathroom Toilet: Standard Discharge Bathroom Accessibility: Yes How Accessible: Accessible via walker Does the patient have any problems obtaining your medications?: No   Social/Family/Support Systems Patient Roles: Spouse; Parent Anticipated Caregiver: Spouse, Jemiah [place] Anticipated Caregiver's Contact Information: [phone] Ability/Limitations of Caregiver: n/a Caregiver Availability: 24/7 Discharge Plan Discussed with Primary Caregiver: Yes Is Caregiver In Agreement with Plan?: Yes Does Caregiver/Family have Issues with Lodging/Transportation while Pt is in Rehab?: No   Goals Patient/Family Goal for Rehab: PT/OT supervision to mod I, SLP supervision Expected length of stay:  9-12 days Pt/Family Agrees to Admission and willing to participate: Yes Program Orientation Provided & Reviewed with Pt/Caregiver Including Roles  & Responsibilities: Yes  Barriers to Discharge: Insurance for SNF coverage   Decrease burden of Care through IP rehab admission: n/a  Possible need for SNF placement upon discharge: Not anticipated.  Pt with good family support, and they were providing 24/7 support prior to this admission.   Patient Condition: I have reviewed medical records from [place], spoken with CM, and spouse. I discussed via phone for inpatient rehabilitation assessment.  Patient will benefit from ongoing PT, OT and SLP, can actively participate in 3 hours of therapy a day 5 days of the week, and can make measurable gains during the admission.  Patient will also benefit from the coordinated team approach during an Inpatient Acute Rehabilitation admission.  The patient will receive intensive therapy as well as Rehabilitation physician, nursing, social worker, and care management interventions.  Due to bladder management, bowel management, safety, skin/wound care, disease management, medication administration, pain management and patient education the patient requires 24 hour a day rehabilitation nursing.  The patient is currently min/mod assist with mobility and basic ADLs.  Discharge setting and therapy post discharge at home with home health is anticipated.  Patient has agreed to participate in the Acute Inpatient Rehabilitation Program and will admit Friday 3/18 .  Preadmission Screen Completed By: Shann Medal, PT, DPT with updates by Cleatrice Burke, 08/27/2020 8:22 AM ______________________________________________________________________   Discussed status with Dr. Naaman Plummer on 08/27/2020  at 0830  and received approval for admission today.  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 Does the need for close, 24 hr/day Medical supervision in concert with the patient's rehab needs make it unreasonable for this patient to be served in a less intensive setting? Yes 2. Co-Morbidities requiring supervision/potential complications: prior right bg cva, htn, osa 3. Due to bladder management, bowel management, safety, skin/wound care, disease management, medication administration, pain management and patient education, does the patient require 24 hr/day rehab nursing? Yes 4. Does the patient require coordinated care of a physician, rehab nurse, PT, OT, and SLP to address physical and functional deficits in the context of the above medical diagnosis(es)? Yes Addressing deficits in the following areas: balance, endurance, locomotion, strength, transferring, bowel/bladder control, bathing, dressing, feeding, grooming, toileting, cognition and psychosocial support 5. Can the patient actively participate in an intensive therapy program of at least 3 hrs of therapy 5 days a week? Yes 6. The potential for patient to make measurable gains while on inpatient rehab is excellent 7. Anticipated functional outcomes upon discharge from inpatient  rehab: modified independent and supervision PT, modified independent and supervision OT, supervision SLP 8. Estimated rehab length of stay to reach the above functional goals is: 9-12 days 9. Anticipated discharge destination: Home 10. Overall Rehab/Functional Prognosis: excellent  MD Signature Meredith Staggers, MD, [place] [facility] 08/27/2020

## 2020-08-27 ENCOUNTER — Other Ambulatory Visit: Payer: Self-pay

## 2020-08-27 ENCOUNTER — Inpatient Hospital Stay (HOSPITAL_COMMUNITY)
Admission: RE | Admit: 2020-08-27 | Discharge: 2020-09-15 | DRG: 056 | Disposition: A | Discharge status: Other Acute Inpatient Hospital | Payer: Commercial Managed Care - PPO | Source: Other Acute Inpatient Hospital | Attending: Physical Medicine & Rehabilitation | Admitting: Physical Medicine & Rehabilitation

## 2020-08-27 ENCOUNTER — Encounter (HOSPITAL_COMMUNITY): Payer: Self-pay | Admitting: Physical Medicine & Rehabilitation

## 2020-08-27 DIAGNOSIS — T380X5A Adverse effect of glucocorticoids and synthetic analogues, initial encounter: Secondary | ICD-10-CM | POA: Diagnosis present

## 2020-08-27 DIAGNOSIS — D62 Acute posthemorrhagic anemia: Secondary | ICD-10-CM | POA: Diagnosis present

## 2020-08-27 DIAGNOSIS — F482 Pseudobulbar affect: Secondary | ICD-10-CM | POA: Diagnosis present

## 2020-08-27 DIAGNOSIS — Z86718 Personal history of other venous thrombosis and embolism: Secondary | ICD-10-CM

## 2020-08-27 DIAGNOSIS — I69354 Hemiplegia and hemiparesis following cerebral infarction affecting left non-dominant side: Secondary | ICD-10-CM | POA: Diagnosis present

## 2020-08-27 DIAGNOSIS — I10 Essential (primary) hypertension: Secondary | ICD-10-CM | POA: Diagnosis present

## 2020-08-27 DIAGNOSIS — E871 Hypo-osmolality and hyponatremia: Secondary | ICD-10-CM | POA: Diagnosis present

## 2020-08-27 DIAGNOSIS — G8114 Spastic hemiplegia affecting left nondominant side: Secondary | ICD-10-CM | POA: Diagnosis not present

## 2020-08-27 DIAGNOSIS — I69322 Dysarthria following cerebral infarction: Secondary | ICD-10-CM

## 2020-08-27 DIAGNOSIS — Z20822 Contact with and (suspected) exposure to covid-19: Secondary | ICD-10-CM | POA: Diagnosis present

## 2020-08-27 DIAGNOSIS — T148XXA Other injury of unspecified body region, initial encounter: Secondary | ICD-10-CM

## 2020-08-27 DIAGNOSIS — G479 Sleep disorder, unspecified: Secondary | ICD-10-CM | POA: Diagnosis not present

## 2020-08-27 DIAGNOSIS — I82452 Acute embolism and thrombosis of left peroneal vein: Secondary | ICD-10-CM | POA: Diagnosis present

## 2020-08-27 DIAGNOSIS — C711 Malignant neoplasm of frontal lobe: Secondary | ICD-10-CM | POA: Diagnosis present

## 2020-08-27 DIAGNOSIS — G936 Cerebral edema: Secondary | ICD-10-CM | POA: Diagnosis present

## 2020-08-27 DIAGNOSIS — L24A9 Irritant contact dermatitis due friction or contact with other specified body fluids: Secondary | ICD-10-CM

## 2020-08-27 DIAGNOSIS — K5901 Slow transit constipation: Secondary | ICD-10-CM | POA: Diagnosis present

## 2020-08-27 DIAGNOSIS — R451 Restlessness and agitation: Secondary | ICD-10-CM | POA: Diagnosis not present

## 2020-08-27 DIAGNOSIS — F419 Anxiety disorder, unspecified: Secondary | ICD-10-CM | POA: Diagnosis present

## 2020-08-27 DIAGNOSIS — R441 Visual hallucinations: Secondary | ICD-10-CM | POA: Diagnosis not present

## 2020-08-27 DIAGNOSIS — I959 Hypotension, unspecified: Secondary | ICD-10-CM | POA: Diagnosis present

## 2020-08-27 DIAGNOSIS — F17210 Nicotine dependence, cigarettes, uncomplicated: Secondary | ICD-10-CM | POA: Diagnosis present

## 2020-08-27 DIAGNOSIS — I82432 Acute embolism and thrombosis of left popliteal vein: Secondary | ICD-10-CM | POA: Diagnosis present

## 2020-08-27 DIAGNOSIS — R569 Unspecified convulsions: Secondary | ICD-10-CM | POA: Diagnosis not present

## 2020-08-27 DIAGNOSIS — R4586 Emotional lability: Secondary | ICD-10-CM | POA: Diagnosis not present

## 2020-08-27 DIAGNOSIS — G8194 Hemiplegia, unspecified affecting left nondominant side: Secondary | ICD-10-CM

## 2020-08-27 DIAGNOSIS — Z79899 Other long term (current) drug therapy: Secondary | ICD-10-CM | POA: Diagnosis not present

## 2020-08-27 DIAGNOSIS — G4089 Other seizures: Secondary | ICD-10-CM | POA: Diagnosis present

## 2020-08-27 DIAGNOSIS — C719 Malignant neoplasm of brain, unspecified: Secondary | ICD-10-CM | POA: Diagnosis present

## 2020-08-27 DIAGNOSIS — G441 Vascular headache, not elsewhere classified: Secondary | ICD-10-CM | POA: Diagnosis not present

## 2020-08-27 DIAGNOSIS — R609 Edema, unspecified: Secondary | ICD-10-CM | POA: Diagnosis not present

## 2020-08-27 DIAGNOSIS — K5900 Constipation, unspecified: Secondary | ICD-10-CM | POA: Diagnosis present

## 2020-08-27 HISTORY — DX: Obesity, unspecified: E66.9

## 2020-08-27 HISTORY — DX: Essential (primary) hypertension: I10

## 2020-08-27 LAB — GLUCOSE, CAPILLARY
Glucose-Capillary: 93 mg/dL (ref 70–99)
Glucose-Capillary: 119 mg/dL — ABNORMAL HIGH (ref 70–99)
Glucose-Capillary: 155 mg/dL — ABNORMAL HIGH (ref 70–99)

## 2020-08-27 LAB — HEMOGLOBIN A1C
Hgb A1c MFr Bld: 5.8 % — ABNORMAL HIGH (ref 4.8–5.6)
Mean Plasma Glucose: 119.76 mg/dL

## 2020-08-27 MED ORDER — ACETAMINOPHEN 325 MG PO TABS
650.0000 mg | ORAL_TABLET | Freq: Three times a day (TID) | ORAL | Status: DC
  Administered 2020-08-27 – 2020-09-15 (×71): 650 mg via ORAL
  Filled 2020-08-27 (×76): qty 2

## 2020-08-27 MED ORDER — BACITRACIN ZINC 500 UNIT/GM EX OINT
TOPICAL_OINTMENT | Freq: Two times a day (BID) | CUTANEOUS | Status: DC
  Administered 2020-08-28 – 2020-09-09 (×6): 1 via TOPICAL
  Filled 2020-08-27 (×6): qty 28.35

## 2020-08-27 MED ORDER — INSULIN ASPART 100 UNIT/ML ~~LOC~~ SOLN: 0.0000 [IU] | Freq: Every day | SUBCUTANEOUS | Status: DC

## 2020-08-27 MED ORDER — BISACODYL 10 MG RE SUPP
10.0000 mg | Freq: Every day | RECTAL | Status: DC | PRN
  Administered 2020-08-29 – 2020-09-02 (×2): 10 mg via RECTAL
  Filled 2020-08-27 (×2): qty 1

## 2020-08-27 MED ORDER — ENOXAPARIN SODIUM 40 MG/0.4ML ~~LOC~~ SOLN
40.0000 mg | SUBCUTANEOUS | Status: DC
  Administered 2020-08-27: 40 mg via SUBCUTANEOUS
  Filled 2020-08-27: qty 0.4

## 2020-08-27 MED ORDER — PROCHLORPERAZINE EDISYLATE 10 MG/2ML IJ SOLN: 5.0000 mg | Freq: Four times a day (QID) | INTRAMUSCULAR | Status: DC | PRN

## 2020-08-27 MED ORDER — SENNOSIDES-DOCUSATE SODIUM 8.6-50 MG PO TABS
2.0000 | ORAL_TABLET | Freq: Two times a day (BID) | ORAL | Status: DC
  Administered 2020-08-28 – 2020-09-14 (×35): 2 via ORAL
  Filled 2020-08-27 (×36): qty 2

## 2020-08-27 MED ORDER — TRAZODONE HCL 50 MG PO TABS
25.0000 mg | ORAL_TABLET | Freq: Every evening | ORAL | Status: DC | PRN
  Administered 2020-08-28: 25 mg via ORAL
  Filled 2020-08-27: qty 1

## 2020-08-27 MED ORDER — DEXAMETHASONE 4 MG PO TABS: 4.0000 mg | ORAL_TABLET | Freq: Two times a day (BID) | ORAL | Status: DC

## 2020-08-27 MED ORDER — DEXAMETHASONE 2 MG PO TABS
2.0000 mg | ORAL_TABLET | Freq: Two times a day (BID) | ORAL | Status: DC
  Administered 2020-08-30: 2 mg via ORAL
  Filled 2020-08-27: qty 1

## 2020-08-27 MED ORDER — GUAIFENESIN-DM 100-10 MG/5ML PO SYRP: 5.0000 mL | ORAL_SOLUTION | Freq: Four times a day (QID) | ORAL | Status: DC | PRN

## 2020-08-27 MED ORDER — DEXAMETHASONE 4 MG PO TABS
4.0000 mg | ORAL_TABLET | Freq: Three times a day (TID) | ORAL | Status: AC
  Administered 2020-08-28 (×3): 4 mg via ORAL
  Filled 2020-08-27 (×3): qty 1

## 2020-08-27 MED ORDER — PROCHLORPERAZINE MALEATE 5 MG PO TABS: 5.0000 mg | ORAL_TABLET | Freq: Four times a day (QID) | ORAL | Status: DC | PRN

## 2020-08-27 MED ORDER — DEXAMETHASONE 4 MG PO TABS
4.0000 mg | ORAL_TABLET | Freq: Four times a day (QID) | ORAL | Status: AC
  Administered 2020-08-27 – 2020-08-28 (×3): 4 mg via ORAL
  Filled 2020-08-27 (×3): qty 1

## 2020-08-27 MED ORDER — DEXAMETHASONE 4 MG PO TABS
4.0000 mg | ORAL_TABLET | Freq: Two times a day (BID) | ORAL | Status: AC
  Administered 2020-08-29 (×2): 4 mg via ORAL
  Filled 2020-08-27 (×2): qty 1

## 2020-08-27 MED ORDER — FLUOXETINE HCL 20 MG PO CAPS
20.0000 mg | ORAL_CAPSULE | Freq: Every day | ORAL | Status: DC
  Administered 2020-08-28 – 2020-09-15 (×19): 20 mg via ORAL
  Filled 2020-08-27 (×21): qty 1

## 2020-08-27 MED ORDER — PROCHLORPERAZINE 25 MG RE SUPP: 12.5000 mg | Freq: Four times a day (QID) | RECTAL | Status: DC | PRN

## 2020-08-27 MED ORDER — INSULIN ASPART 100 UNIT/ML ~~LOC~~ SOLN
0.0000 [IU] | Freq: Three times a day (TID) | SUBCUTANEOUS | Status: DC
  Administered 2020-08-28 – 2020-09-12 (×13): 1 [IU] via SUBCUTANEOUS

## 2020-08-27 MED ORDER — LEVETIRACETAM 500 MG PO TABS
1000.0000 mg | ORAL_TABLET | Freq: Two times a day (BID) | ORAL | Status: DC
  Administered 2020-08-27 – 2020-09-15 (×40): 1000 mg via ORAL
  Filled 2020-08-27 (×41): qty 2

## 2020-08-27 MED ORDER — PANTOPRAZOLE SODIUM 40 MG PO TBEC
40.0000 mg | DELAYED_RELEASE_TABLET | Freq: Every day | ORAL | Status: DC
  Administered 2020-08-28 – 2020-09-15 (×19): 40 mg via ORAL
  Filled 2020-08-27 (×20): qty 1

## 2020-08-27 MED ORDER — OXYCODONE HCL 5 MG PO TABS
5.0000 mg | ORAL_TABLET | ORAL | Status: DC | PRN
  Administered 2020-08-27 – 2020-09-13 (×9): 5 mg via ORAL
  Filled 2020-08-27 (×9): qty 1

## 2020-08-27 MED ORDER — DEXAMETHASONE 2 MG PO TABS: 2.0000 mg | ORAL_TABLET | Freq: Every day | ORAL | Status: DC

## 2020-08-27 MED ORDER — ATORVASTATIN CALCIUM 10 MG PO TABS
10.0000 mg | ORAL_TABLET | Freq: Every day | ORAL | Status: DC
  Administered 2020-08-28 – 2020-09-13 (×16): 10 mg via ORAL
  Filled 2020-08-27 (×18): qty 1

## 2020-08-27 MED ORDER — DIPHENHYDRAMINE HCL 12.5 MG/5ML PO ELIX: 12.5000 mg | ORAL_SOLUTION | Freq: Four times a day (QID) | ORAL | Status: DC | PRN

## 2020-08-27 MED ORDER — FLEET ENEMA 7-19 GM/118ML RE ENEM
1.0000 | ENEMA | Freq: Once | RECTAL | Status: AC | PRN
  Administered 2020-08-29: 1 via RECTAL
  Filled 2020-08-27: qty 1

## 2020-08-27 MED ORDER — ALUM & MAG HYDROXIDE-SIMETH 200-200-20 MG/5ML PO SUSP: 30.0000 mL | ORAL | Status: DC | PRN

## 2020-08-27 MED ORDER — ACETAMINOPHEN 325 MG PO TABS: 325.0000 mg | ORAL_TABLET | ORAL | Status: DC | PRN

## 2020-08-27 MED ORDER — POLYETHYLENE GLYCOL 3350 17 G PO PACK
17.0000 g | PACK | Freq: Every day | ORAL | Status: DC | PRN
  Administered 2020-09-01: 17 g via ORAL
  Filled 2020-08-27: qty 1

## 2020-08-27 MED ORDER — POLYETHYLENE GLYCOL 3350 17 G PO PACK
17.0000 g | PACK | Freq: Every day | ORAL | Status: DC
  Administered 2020-08-28 – 2020-09-15 (×18): 17 g via ORAL
  Filled 2020-08-27 (×20): qty 1

## 2020-08-27 NOTE — Progress Notes (Signed)
Cristina Gong, RN  Rehab Admission Coordinator  Physical Medicine and Rehabilitation  PMR Pre-admission  Signed  Encounter Date:  08/26/2020      Related encounter: Documentation from 08/26/2020 in [place]          Show:Clear all [x]Manual[x]Template[]Copied  Added by: [x]Boyette, Vertis Kelch, RN[x]Swartz, Celesta Gentile, MD[x]Warren, Earnest Conroy, PT   []Hover for details  PMR Admission Coordinator Pre-Admission Assessment   Patient: Todd Villa is an 50 y.o., male MRN: [id number] DOB: [birth year] Height: 6' 3" (1.905 m) Weight: (!) 148.3 kg   Insurance Information HMO:     PPO: yes     PCP:     IPA:      80/20:      OTHER:  PRIMARY: UMR      Policy#: [id number]      Subscriber: pt CM Name: Lattie Haw      Phone#: [phone]     Fax#: [phone] Pre-Cert#: [id number] auth for CIR provided by Lattie Haw with UMR with updates due to fax listed above on 3/25 Employer: [employer] Benefits:  Phone #: [phone]     Name:  Eff. Date: 06/12/20     Deduct: $4000 (met $80.01)      Out of Pocket Max: [phone] ($182.01 met)      Life Max: n/a CIR: 100% once deductible met      SNF: 100% once deductible met Outpatient:      Co-Pay: $60 copay until deductible met, then 100% Home Health: 100%      Co-Pay:  DME: 100%     Co-Pay:  Providers:  SECONDARY:       Policy#:       Phone#:    Development worker, community:       Phone#:    The "Data Collection Information Summary" for patients in Inpatient Rehabilitation Facilities with attached "Privacy Act [place] Records" was provided and verbally reviewed with: N/A   Emergency Contact Information         Contact Information     Name Relation Home Work [place] [phone]   [phone]         Current Medical History  Patient Admitting Diagnosis: brain mass   History of Present Illness: Pt is a 50 y/o male with recent admission to [facility] in  January of 2022 where he was diagnosed with a R basal ganglia CVA and d/c'd home with family support.  He presented to [facility] again per his PCP direction on 3/8 with worsening symptoms.  Imaging revealed R frontal intracranial mass with significant edema and 7 mm midline shift.  On admit to [facility] pt afebrile, hemodynamically stable, and labs only notable for elevated WBC 19, though he had received 10 mg of decadron 6 hrs prior.  Pt endorsed dizziness, falls, throbbing headaches that have worsened over the week prior to admission, as well as new onset incontinence B/B.  Pt was transferred to [facility] on 3/8 for planned resection.  PMH of HTN, CVA, and OSA.  Pt underwent tumor resection on 3/10 per Dr. 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  Post op course complicated by hypotension, ABLA, and increased somnolence on POD 1, imaging showing increased edema and midline shift, but improved with increased dose of decadron. Post operatively also noted to have black, tarry stool x2 with significant Hgb drop and GI workup confirmed bleeding stomach vessel, which was clipped.  Hgb now stable.  Pt tolerating a regular diet, though does present with some cognitive deficits.  PT/OT evaluations were completed and pt was recommended for a comprehensive inpatient rehabilitation program.     Patient's medical record from [place] has been reviewed by the rehabilitation admission coordinator and physician.   Past Medical History  No past medical history on file.   Family History   family history includes Alcohol abuse in his father; Diabetes in his mother and sister; Heart disease in his father and mother; Hyperlipidemia in his mother; Hypertension in his father and mother.   Prior Rehab/Hospitalizations Has the patient had prior rehab or hospitalizations prior to admission? Yes   Has the patient had major surgery during 100 days prior to admission? Yes              Current Medications   Current  Outpatient Medications:  .  CHANTIX 1 MG tablet, TAKE 1 TABLET BY MOUTH TWICE DAILY (Patient not taking: Reported on 04/11/2016), Disp: 56 tablet, Rfl: 0 .  doxycycline (VIBRA-TABS) 100 MG tablet, Take 1 tablet (100 mg total) by mouth 2 (two) times daily., Disp: 20 tablet, Rfl: 0 .  predniSONE (STERAPRED UNI-PAK 21 TAB) 10 MG (21) TBPK tablet, As directed, Disp: 21 tablet, Rfl: 0   Patients Current Diet: Regular Diet, thin liquids   Precautions / Restrictions      Has the patient had 2 or more falls or a fall with injury in the past year? 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  Driving, no DME.  Since hospitalization in january has been requiring overall min assist, with recent decline 2/2 vasogenic edema and midline shift 2/2 mass.     Prior Functional Level Self Care: Did the patient need help bathing, dressing, using the toilet or eating? Independent   Indoor Mobility: Did the patient need assistance with walking from room to room (with or without device)? Independent   Stairs: Did the patient need assistance with internal or external stairs (with or without device)? Independent   Functional Cognition: Did the patient need help planning regular tasks such as shopping or remembering to take medications? Independent   Home Assistive Devices / Herbalist (specify type); Cane (specify quad or straight); Shower chair with back; Bedside commode/3-in-1 (RW, SPC, gait belt)     Prior Device Use: Indicate devices/aids used by the patient prior to current illness, exacerbation or injury? immediately PTA using cane/walker, but prior to CVA in January was completely independent     Prior Functional Level Current Functional Level  Bed Mobility   Independent  min to mod assist    Transfers   Independent    mod assist scoot transfer    Mobility - Walk/Wheelchair    Independent   Scoot transfer to chair  Upper Body Dressing   Independent    min    Lower Body Dressing   Independent   total  Grooming   Independent   Mod with verbal cues  Eating/Drinking   Independent    min    Toilet Transfer   Independent    mod    Bladder Continence    Continent    continent    Bowel Management   Continent     continent  Stair Climbing   Independent    not attempted    Communication   Independent    min  Memory   Independent  cognitive deficits noted      Special Needs/ Care Considerations CPAP, Skin surgical incision to scalp, Designated visitor [name] Johal, Bowel management continent and Bladder management continent   Previous Home Environment  Living Arrangements: Spouse/significant other Available Help at Discharge: Family; Available 24 hours/day Type of Home: House Home Layout: Able to live on main level with bedroom/bathroom Home Access: Stairs to enter Entrance Stairs-Rails: None Entrance Stairs-Number of Steps: 2SE through garage, 3SE through front Bathroom Shower/Tub: Tub/shower unit Bathroom Toilet: Standard Bathroom Accessibility: Yes How Accessible: Accessible via walker Home Care Services: Yes (home health therapy) Type of Home Care Services: Home OT; Home RN; Home PT Additional Comments: Family was providing 24/7 assist since d/c from [facility] in January 2022     Discharge Living Setting Plans for Discharge Living Setting: Patient's home Type of Home at Discharge: House Discharge Home Layout: Able to live on main level with bedroom/bathroom Discharge Home Access: Stairs to enter Entrance Stairs-Rails: None Entrance Stairs-Number of Steps: 2SE through garage, 3SE through front Discharge Bathroom Shower/Tub: Tub/shower unit Discharge Bathroom Toilet: Standard Discharge Bathroom Accessibility: Yes How Accessible: Accessible via walker Does the patient have any problems obtaining your medications?: No      Social/Family/Support Systems Patient Roles: Spouse; Parent Anticipated Caregiver: Spouse, [name] Henningsen Anticipated Caregiver's Contact Information: [phone] Ability/Limitations of Caregiver: n/a Caregiver Availability: 24/7 Discharge Plan Discussed with Primary Caregiver: Yes Is Caregiver In Agreement with Plan?: Yes Does Caregiver/Family have Issues with Lodging/Transportation while Pt is in Rehab?: No     Goals Patient/Family Goal for Rehab: PT/OT supervision to mod I, SLP supervision Expected length of stay: 9-12 days Pt/Family Agrees to Admission and willing to participate: Yes Program Orientation Provided & Reviewed with Pt/Caregiver Including Roles  & Responsibilities: Yes  Barriers to Discharge: Insurance for SNF coverage     Decrease burden of Care through IP rehab admission: n/a   Possible need for SNF placement upon discharge: Not anticipated.  Pt with good family support, and they were providing 24/7 support prior to this admission.    Patient Condition: I have reviewed medical records from [facility], spoken with CM, and spouse. I discussed via phone for inpatient rehabilitation assessment.  Patient will benefit from ongoing PT, OT and SLP, can actively participate in 3 hours of therapy a day 5 days of the week, and can make measurable gains during the admission.  Patient will also benefit from the coordinated team approach during an Inpatient Acute Rehabilitation admission.  The patient will receive intensive therapy as well as Rehabilitation physician, nursing, social worker, and care management interventions.  Due to bladder management, bowel management, safety, skin/wound care, disease management, medication administration, pain management and patient education the patient requires 24 hour a day rehabilitation nursing.  The patient is currently min/mod assist with mobility and basic ADLs.  Discharge setting and therapy post discharge at home with home health is anticipated.   Patient has agreed to participate in the Acute Inpatient Rehabilitation Program and will admit Friday 3/18 .   Preadmission Screen Completed By: Caitlin Warren, PT, DPT with updates by Boyette, Barbara Godwin, 08/27/2020 8:22 AM ______________________________________________________________________   Discussed status with Dr. Swartz on 08/27/2020  at 0830  and received approval for admission today.   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 Does the need for   close, 24 hr/day Medical supervision in concert with the patient's rehab needs make it unreasonable for this patient to be served in a less intensive setting? Yes 2. Co-Morbidities requiring supervision/potential complications: prior right bg cva, htn, osa 3. Due to bladder management, bowel management, safety, skin/wound care, disease management, medication administration, pain management and patient education, does the patient require 24 hr/day rehab nursing? Yes 4. Does the patient require coordinated care of a physician, rehab nurse, PT, OT, and SLP to address physical and functional deficits in the context of the above medical diagnosis(es)? Yes Addressing deficits in the following areas: balance, endurance, locomotion, strength, transferring, bowel/bladder control, bathing, dressing, feeding, grooming, toileting, cognition and psychosocial support 5. Can the patient actively participate in an intensive therapy program of at least 3 hrs of therapy 5 days a week? Yes 6. The potential for patient to make measurable gains while on inpatient rehab is excellent 7. Anticipated functional outcomes upon discharge from inpatient rehab: modified independent and supervision PT, modified independent and supervision OT, supervision SLP 8. Estimated rehab length of stay to reach the above  functional goals is: 9-12 days 9. Anticipated discharge destination: Home 10. Overall Rehab/Functional Prognosis: excellent   MD Signature Zachary T. Swartz, MD, FAAPMR [place] [facility] 08/27/2020         Cosigned by: Swartz, Zachary T, MD at 08/27/2020  9:21 AM    Revision History                                  Note Details  Author Boyette, Barbara G, RN File Time 08/27/2020  9:21 AM  Author Type Rehab Admission Coordinator Status Signed  Last Editor Swartz, Zachary T, MD Service Physical Medicine and Rehabilitation  Hospital Acct # [id number] Admit Date 08/27/2020    

## 2020-08-27 NOTE — Progress Notes (Addendum)
PROGRESS NOTE   Subjective/Complaints: Pt didn't sleep well. Was restless. Also having  headaches which seem to be more significant at night. Took trazodone without much help for sleep  ROS: Patient denies fever, rash, sore throat, blurred vision, nausea, vomiting, diarrhea, cough, shortness of breath or chest pain, joint or back pain,  or mood change.   Objective:   No results found. No results for input(s): WBC, HGB, HCT, PLT in the last 72 hours. No results for input(s): NA, K, CL, CO2, GLUCOSE, BUN, CREATININE, CALCIUM in the last 72 hours.  Intake/Output Summary (Last 24 hours) at 08/27/2020 2018 Last data filed at 08/27/2020 1805 Gross per 24 hour  Intake 400 ml  Output 1 ml  Net 399 ml        Physical Exam: Vital Signs Blood pressure 128/70, pulse 64, temperature 97.7 F (36.5 C), resp. rate 18, height 6\' 3"  (1.905 m), weight (!) 147.5 kg, SpO2 96 %.  General: Alert and oriented x 3, No apparent distress HEENT: right crani incision CDI.  PERRLA, EOMI, sclera anicteric, oral mucosa pink and moist, dentition intact, ext ear canals clear,  Neck: Supple without JVD or lymphadenopathy Heart: Reg rate and rhythm. No murmurs rubs or gallops Chest: CTA bilaterally without wheezes, rales, or rhonchi; no distress Abdomen: Soft, non-tender, non-distended, bowel sounds positive. Extremities: No clubbing, cyanosis,  Pulses are 2+ Psych: flat but cooperative Skin: Clean and intact without signs of breakdown Neuro: Pt is cognitively appropriate with normal insight, memory, and awareness. Cranial nerves 2-12 are intact. Sensory exam is normal. Reflexes are 2+ on left. Mild flexor tone LUE.. Fine motor coordination is intact. No tremors. Motor function is grossly 5/5 RUE and RLE and 1+ LUE and LLE prox to trace/absent distally.   Musculoskeletal: Full ROM, No pain with AROM or PROM in the neck, trunk, or extremities. Posture  appropriate. Edema LUE trace as well as tr to 1+ pedal edema. Left heel cord tight.        Assessment/Plan: 1. Functional deficits which require 3+ hours per day of interdisciplinary therapy in a comprehensive inpatient rehab setting.  Physiatrist is providing close team supervision and 24 hour management of active medical problems listed below.  Physiatrist and rehab team continue to assess barriers to discharge/monitor patient progress toward functional and medical goals  Care Tool:  Bathing              Bathing assist       Upper Body Dressing/Undressing Upper body dressing        Upper body assist      Lower Body Dressing/Undressing Lower body dressing            Lower body assist       Toileting Toileting    Toileting assist       Transfers Chair/bed transfer  Transfers assist           Locomotion Ambulation   Ambulation assist              Walk 10 feet activity   Assist           Walk 50 feet activity   Assist  Walk 150 feet activity   Assist           Walk 10 feet on uneven surface  activity   Assist           Wheelchair     Assist               Wheelchair 50 feet with 2 turns activity    Assist            Wheelchair 150 feet activity     Assist          Blood pressure 128/70, pulse 64, temperature 97.7 F (36.5 C), resp. rate 18, height 6\' 3"  (1.905 m), weight (!) 147.5 kg, SpO2 96 %. Medical Problem List and Plan: 1.  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 Asked to have family bring                         -consvt mgt of tone for now   2.  Antithrombotics: -DVT/anticoagulation:   Pharmaceutical: Lovenox  -Dopplers today reveal thrombus in the left popliteal and peroneal veins.   -will need to place IVCF given location, high risk of bleed and clot propagation. Spoke with Dr. Vernard Gambles IR about placement   -bed rest for now. May do as much of evals as possible in bed with therapy.              -antiplatelet therapy: N/A 3. Pain Management: Oxycodone prn. Will continue tylenol qid.  -will add HS topamax for headaches and to assist with sleep  4. Mood/sleep:               -antipsychotic agents: N/A  -will schedule trazodone 50mg  at HS 5. Neuropsych: This patient is capable of making decisions on her own behalf. 6. Skin/Wound Care: Routine pressure relief measures.  7. Fluids/Electrolytes/Nutrition:  encourage PO  -I personally reviewed the patient's labs today.    -protein supp for low albumin 8. Hypotension: Monitor BP tid--off Cozaar at this time.  9. ABLA due to UGIB s/p hemo clip: hgb 8.7 today 3/19 10 Leukocytosis: WBC 19.0 at admission. Up to 36k now 31k 3/19  -likely related to steroids and DVT. No fever, s/s infection currently  - 11. Cerebral edema: Continues to have stable right to left 9 mm shift             --Steroid taper per NS recs 12. 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  13. Constipation: Resolved with augmentation of medications.             --reports good results after suppository yesterday.     Greater than 35 total minutes was spent in examination of patient, assessment of pertinent data,  formulation of a treatment plan, and in discussion with patient and/or family.     LOS: 0 days A FACE TO FACE EVALUATION WAS PERFORMED  Meredith Staggers 08/27/2020, 8:18 PM

## 2020-08-27 NOTE — Progress Notes (Signed)
Patient arrived on unit via ambulance. Rehab schedule, medications and plan of care reviewed, patient states an understanding. Patient is Ax4 and has no complications noted at this time. Patient reports pain 0 out of 10. Patient educated on use of call light. Dayna Ramus

## 2020-08-27 NOTE — Progress Notes (Signed)
Inpatient Rehabilitation Care Coordinator Assessment and Plan Patient Details  Name: Todd Villa MRN: [id number] Date of Birth: 1971/03/01  Today's Date: 08/27/2020  Hospital Problems: Principal Problem:   Glioblastoma multiforme of brain [facility])  Past Medical History:  Past Medical History:  Diagnosis Date  . High blood pressure   . Obesity   . 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 ROTATOR CUFF REPAIR Left 2015   Social History:  reports that he has been smoking cigarettes. He has a 19.80 pack-year smoking history. He has never used smokeless tobacco. He reports that he does not drink alcohol and does not use drugs.  Family / Support Systems Marital Status: Married How Long?: 23 years Patient Roles: Spouse,Parent Spouse/Significant Other: Reynold Bowen [phone] Children: 2 adult sons- 71 and 49 y.o.; 5 y.o. dtr Mikayla. All children live in the home. Adult sons both work. Other Supports: None reported Anticipated Caregiver: Wife Ability/Limitations of Caregiver: Pt reports wife will be primary caregiver, and sons will help PRN outside of their work schedule. Caregiver Availability: 24/7 Family Dynamics: Pt lives in the home with his wife and 3 children (33. 48, 71)  Social History Preferred language: English Religion: Baptist Cultural Background: Pt has been working as an Engineer, mining for [employer] Education: 11th grade Read: Yes Write: Yes Employment Status: Employed Length of Employment: 45 (years) Return to Work Plans: pt states there has been no discussion about short term or long term disability with employer, only insurance. Legal History/Current Legal Issues: Denies Guardian/Conservator: N/A   Abuse/Neglect Abuse/Neglect Assessment Can Be Completed: Yes Physical Abuse: Denies Verbal Abuse: Denies Sexual Abuse: Denies Exploitation of patient/patient's resources:  Denies Self-Neglect: Denies  Emotional Status Pt's affect, behavior and adjustment status: Pt presented with flat affect, however, in good spirits at time of visit Recent Psychosocial Issues: Denies Psychiatric History: Denies Substance Abuse History: Denies; admits he quit smoking 1pk of cigarettes per day in Jan 2022.  Patient / Family Perceptions, Expectations & Goals Pt/Family understanding of illness & functional limitations: Pt has general understanding of care needs Premorbid pt/family roles/activities: Independent Anticipated changes in roles/activities/participation: Assistance with ADLs/IADLs Pt/family expectations/goals: Pt goal is to "get up and go to bathroom on my own...take care of my self." Pt would like to be able to complete own self-care needs.  [employer]: None (Per EMR and pt, prior HHPT/OT/SN with [facility]) Premorbid Home Care/DME Agencies: Other (Comment) (Per EMR HHPT/OT/SN. Pt states agency is [facility]) Transportation available at discharge: Wife Resource referrals recommended: Neuropsychology  Discharge Planning Living Arrangements: Spouse/significant other,Children Support Systems: Spouse/significant other,Children Type of Residence: Private residence Administrator, sports: Multimedia programmer (specify) Heritage manager) Financial Resources: Water quality scientist Screen Referred: No Living Expenses: Medical laboratory scientific officer Management: Spouse Does the patient have any problems obtaining your medications?: No Home Management: Pt reports that his wife managed housekeeping and meals Patient/Family Preliminary Plans: No changes Care Coordinator Barriers to Discharge: Decreased caregiver support,Lack of/limited family support Care Coordinator Anticipated Follow Up Needs: HH/OP  Clinical Impression SW met with pt in room to introduce self, explain role, and discuss discharge process. Pt is not a English as a second language teacher. No formal HCOPA  paperwork, but states his HCOPA is his wife Maudie Mercury. Pt states HHA is [facility]. DME: cane and RW. Pt lives in two story home with 2SE at garage and 3SE at front.   63- SW left message for pt wife introducing self,  and informed will follow-up on Tuesday after team conference with updates.   Rana Snare 08/27/2020, 3:33 PM

## 2020-08-27 NOTE — Progress Notes (Signed)
Inpatient Rehabilitation  Patient information reviewed and entered into eRehab system by Melissa M. Bowie, M.A., CCC/SLP, PPS Coordinator.  Information including medical coding, functional ability and quality indicators will be reviewed and updated through discharge.    

## 2020-08-27 NOTE — H&P (Signed)
Physical Medicine and Rehabilitation Admission H&P   CC: Functional deficits   HPI:  Todd Villa is a 50 year old LH-male with history of HTN, OSA?, obesity, recent CVA with mild left sided weakness who [facility] after fall with brief LOC and reports to 2 week history of progressive left sided weakness with lethargy. He was found to have large heterogenous mass concerning for GBM centered in deep right frontoparietal region extending to right basal ganglia with prominent vasogenic edema with partial effacement of left lateral ventricle and 7 mm midline shift as well as flattening of optic discs s/o increased ICP. He did have seizure in [facility] ED, was started on Keppra as well as decadron and transferred to [facility] for management.   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  On 03/12, He underwent right crani for tumor resection by Dr. Davene Costain. Kathline Magic with pathology revealing high grade glioma. Hospital course significant for somnolence day after surgery due to progressive cerebral edema which improved with addition of mannitol as well as increase in steroids.  He did develop tachycardia with tarry stools and drop in H/H due to actively bleeding gastric blood vessel which was treated with clipping by GI on 03/12. H/H improved with transfusion of 2 units PRBC and tarry stools continued due to residual blood as H/H stable. Hypotension resolved with d/c of meds and Sq Lovenox added for DVT prophylaxis. Pain managed with tylenol qid and oxycodone prn. Steroids being weaned off. He  has been referred to GI for work up of elevated bilirubin. Patient with resultant left hemiplegia as well as cognitive deficits affecting ADLs and mobility. CIR recommended due to functional decline.    Review of Systems  Constitutional: Negative for chills and fever.  HENT: Negative for hearing loss and tinnitus.   Eyes: Negative for blurred vision and double vision.   Respiratory: Negative for cough and shortness of breath.   Cardiovascular: Negative for chest pain and palpitations.  Gastrointestinal: Positive for constipation. Negative for heartburn and nausea.  Genitourinary: Positive for frequency (gets up 4-5 times at nights). Negative for dysuria.  Musculoskeletal: Negative for myalgias and neck pain.  Skin: Negative for rash.  Neurological: Positive for speech change and weakness. Negative for dizziness and headaches.  Psychiatric/Behavioral: Negative for memory loss.     Past Medical History:  Diagnosis Date  . High blood pressure   . 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 ROTATOR CUFF REPAIR Left 2015    Family History  Problem Relation Age of Onset  . Hyperlipidemia Mother   . Heart disease Mother   . Hypertension Mother   . Diabetes Mother   . Alcohol abuse Father   . Heart disease Father   . Hypertension Father   . Diabetes Sister   . Cancer Neg Hx   . 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 He used to work for [employer].  Smoked 1PPD --quit 06/2020. He reports that he has been smoking cigarettes. He has a 19.80 pack-year smoking history. He has never used smokeless tobacco. He reports that he does not drink alcohol and does not use drugs.   Allergies  Allergen Reactions  . Penicillins Other (See Comments)    Medications Prior to Admission  Medication Sig Dispense Refill  . CHANTIX 1  MG tablet TAKE 1 TABLET BY MOUTH TWICE DAILY (Patient not taking: Reported on 04/11/2016) 56 tablet 0  . doxycycline (VIBRA-TABS) 100 MG tablet Take 1 tablet (100 mg total) by mouth 2 (two) times daily. 20 tablet 0  . predniSONE (STERAPRED UNI-PAK 21 TAB) 10 MG (21) TBPK tablet As directed 21 tablet 0    Drug Regimen Review  Drug regimen was reviewed and remains appropriate with no significant issues identified  Home:      Functional History: Independent--was getting home therapy PTA.   Functional Status:  Mobility: Mod assist for scoot transfer   ADL: MIn assist for UB care Total assist for LB care  Cognition: Min cues for communication     Physical Exam: Blood pressure 111/69, pulse (!) 57, temperature 97.8 F (36.6 C), resp. rate 17, height 6\' 3"  (1.905 m), weight (!) 147.5 kg, SpO2 97 %. Physical Exam Vitals and nursing note reviewed.  Constitutional:      General: He is not in acute distress.    Appearance: Normal appearance. He is obese. He is not ill-appearing.  HENT:     Head:     Comments: Right crani incision intact, boggy with sutures in place. Area dry.    Right Ear: External ear normal.     Left Ear: External ear normal.     Nose: Nose normal. No congestion.     Mouth/Throat:     Mouth: Mucous membranes are moist.     Pharynx: Oropharynx is clear.  Eyes:     Conjunctiva/sclera: Conjunctivae normal.     Pupils: Pupils are equal, round, and reactive to light.  Cardiovascular:     Rate and Rhythm: Normal rate and regular rhythm.     Heart sounds: No murmur heard. No gallop.   Pulmonary:     Effort: Pulmonary effort is normal. No respiratory distress.     Breath sounds: No wheezing or rales.  Abdominal:     General: Bowel sounds are normal. There is no distension.     Palpations: Abdomen is soft. There is no mass.  Musculoskeletal:        General: No tenderness or deformity.     Cervical back: Normal range of motion and neck Azyiah Bo.     Comments: 1 to 2+ pedal edema. Trace edema LUE. LLE heel cord tight at about the 90 degree position.   Skin:    General: Skin is warm and dry.  Neurological:     Mental Status: He is alert and oriented to person, place, and time.     Comments: Alert and oriented x 3. Fair insight and awareness. Intact Memory. Normal language and speech. Mild left central 7 with dysarthria. Mild right inattention but able to scan all fields. Slow  processing. LUE 1+ biceps, pecs and 0/5 distally. Flexor tone noted 1+/4. LLE 1/5 prox to distal with extensor posture. DTR's 2++ on left 1+ on right.   Psychiatric:     Comments: Flat but generally cooperative and overall pleasant      NOTE; XRAY AND LAB REPORTS WERE COPIED FROM [facility] RECORDS:  CT BRAIN WITHOUT CONTRAST   INDICATION: Brain mass or lesion, right front/parietal mass s/p resection  with concern for worsening exam eval for worsening edema or evidence of  hemorrage, D49.6 Neoplasm of unspecified behavior of brain (CMS-HCC)   COMPARISON: August 22, 2020 CT brain, 01:14   TECHNIQUE: Standard noncontrast brain CT.   FINDINGS:  Brain Parenchyma: Redemonstrated right to left midline shift measuring 9  mm, previously 9 mm. Persistent effacement of the sulci in the right  cerebral hemisphere. Similar appearance of hypoattenuation surrounding the  resection cavity and small-volume hyperdense blood products within the  resection cavity.  Ventricles and Sulci: Unchanged effacement of the right lateral ventricle.  Extra-Axial Spaces: Stable to slightly decreased size of right extra-axial  hematoma now measuring 0.9 cm in maximal thickness, previously 1.2 cm.  Basal Cisterns: Normal.   Paranasal Sinuses: Small mucous retention cysts in the bilateral maxillary  and sphenoid sinuses  Mastoid air cells: Normal.   Orbits: Normal.  Cranium and Bones: Status post right parietal craniotomy.  Soft Tissues: Similar appearance of soft tissue swelling and hematoma along  the right scalp. Decreased subcutaneous emphysema.    IMPRESSION:  1.  Stable right to left midline shift measuring 9 mm.  2.  Stable appearance of right extra-axial hematoma and right frontal lobe  resection cavity with layering blood products and associated edema.   The preliminary report (critical or emergent communication) was reviewed  prior to this dictation and there are no substantial differences between   the preliminary results and the impressions in this final report.   Electronically Reviewed by:  Baxter Kail, MD, [facility]  Electronically Reviewed on:  08/23/2020 10:08 AM   I have reviewed the images and concur with the above findings.   Electronically Signed by:  Lilli Few, MD, [facility]  Electronically Signed on:  08/23/2020 10:15 AM  Procedure Note  Lilli Few, MD - 08/23/2020  Formatting of this note might be different from the original.  CT BRAIN WITHOUT CONTRAST   INDICATION: Brain mass or lesion, right front/parietal mass s/p resection  with concern for worsening exam eval for worsening edema or evidence of  hemorrage, D49.6 Neoplasm of unspecified behavior of brain (CMS-HCC)   COMPARISON: August 22, 2020 CT brain, 01:14   TECHNIQUE: Standard noncontrast brain CT.   FINDINGS:  Brain Parenchyma: Redemonstrated right to left midline shift measuring 9  mm, previously 9 mm. Persistent effacement of the sulci in the right  cerebral hemisphere. Similar appearance of hypoattenuation surrounding the  resection cavity and small-volume hyperdense blood products within the  resection cavity.  Ventricles and Sulci: Unchanged effacement of the right lateral ventricle.  Extra-Axial Spaces: Stable to slightly decreased size of right extra-axial  hematoma now measuring 0.9 cm in maximal thickness, previously 1.2 cm.  Basal Cisterns: Normal.   Paranasal Sinuses: Small mucous retention cysts in the bilateral maxillary  and sphenoid sinuses  Mastoid air cells: Normal.   Orbits: Normal.  Cranium and Bones: Status post right parietal craniotomy.  Soft Tissues: Similar appearance of soft tissue swelling and hematoma along  the right scalp. Decreased subcutaneous emphysema.    IMPRESSION:  1.  Stable right to left midline shift measuring 9 mm.  2.  Stable appearance of right extra-axial hematoma and right frontal lobe  resection cavity with layering blood  products and associated edema.   The preliminary report (critical or emergent communication) was reviewed  prior to this dictation and there are no substantial differences between  the preliminary results and the impressions in this final report.   Electronically Reviewed by:  Baxter Kail, MD, [facility]  Electronically Reviewed on:  08/23/2020 10:08 AM   I have reviewed the images and concur with the above findings.   Electronically Signed by:  Lilli Few, MD, [facility]  Electronically Signed on:  08/23/2020 10:15 AM    CBC 08/27/20 Specimen:  Blood  Ref Range & Units Today  WBC (White Blood Cell Count) 3.2 - 9.8 x10^9/L 36.0 High    Hemoglobin 13.7 - 17.3 g/dL 8.3 Low    Hematocrit 39.0 - 49.0 % 24.9 Low    Platelets 150 - 450 x10^9/L 348   MCV (Mean Corpuscular Volume) 80 - 98 fL 92   MCH (Mean Corpuscular Hemoglobin) 26.5 - 34.0 pg 30.6   MCHC (Mean Corpuscular Hemoglobin Concentration) 31.5 - 36.3 % 33.3   RBC (Red Blood Cell Count) 4.37 - 5.74 x10^12/L 2.71 Low    RDW-CV (Red Cell Distribution Width) 11.5 - 14.5 % 15.0 High    NRBC (Nucleated Red Blood Cell Count) 0 x10^9/L 0.42 High    NRBC % (Nucleated Red Blood Cell %) % 1.2   MPV (Mean Platelet Volume) 7.2 - 11.7 fL 10.7     Basic M etabolic panel 32/20/25  Ref Range & Units Today Comments  Sodium 135 - 145 mmol/L 135     Potassium 3.5 - 5.0 mmol/L 4.1    Chloride 98 - 108 mmol/L 98    Carbon Dioxide (CO2) 21 - 30 mmol/L 26    Urea Nitrogen (BUN) 7 - 20 mg/dL 18    Creatinine 0.6 - 1.3 mg/dL 0.8    Glucose 70 - 140 mg/dL 105       Medical Problem List and Plan: 1.  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 Will ask family to bring   -consvt mgt of tone for now 2.   Antithrombotics: -DVT/anticoagulation:  Pharmaceutical: Lovenox  -antiplatelet therapy: N/A 3. Pain Management: Oxycodone prn. Will continue tylenol qid.  4. Mood: LCSW to follow for evaluation and support.   -antipsychotic agents: N/A 5. Neuropsych: This patient is capable of making decisions on her own behalf. 6. Skin/Wound Care: Routine pressure relief measures.  7. Fluids/Electrolytes/Nutrition: Monitor I/O. Check lytes in am. 8. Hypotension: Monitor BP tid--off Cozaar at this time.  9. ABLA due to UGIB s/p hemo clip: Will monitor H/H with serial checks.  10 Leucocytosis: WBC 19.0 at admission-->trending upward to 36.0 today.   --Monitor for fevers and other signs of infection.   --Recheck CBC in am to monitor WBC/H/H.  -pt remains on steroid taper  -no clinical signs of infection 11. Cerebral edema: Continues to have stable right to left 9 mm shift  --Steroid taper per recs 12. 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  13. Constipation: Resolved with augmentation of medications.  --reports good results after suppository yesterday.       I have personally performed a face to face diagnostic evaluation of this patient and formulated the key components of the plan.  Additionally, I have personally reviewed laboratory data, imaging studies, as well as relevant notes and concur with the physician assistant's documentation above.   Meredith Staggers, MD, [place], PA-C 08/27/2020

## 2020-08-28 ENCOUNTER — Inpatient Hospital Stay (HOSPITAL_COMMUNITY): Payer: Commercial Managed Care - PPO

## 2020-08-28 DIAGNOSIS — R609 Edema, unspecified: Secondary | ICD-10-CM

## 2020-08-28 DIAGNOSIS — G8194 Hemiplegia, unspecified affecting left nondominant side: Secondary | ICD-10-CM

## 2020-08-28 DIAGNOSIS — I82432 Acute embolism and thrombosis of left popliteal vein: Secondary | ICD-10-CM

## 2020-08-28 DIAGNOSIS — C719 Malignant neoplasm of brain, unspecified: Secondary | ICD-10-CM | POA: Diagnosis not present

## 2020-08-28 DIAGNOSIS — G441 Vascular headache, not elsewhere classified: Secondary | ICD-10-CM

## 2020-08-28 DIAGNOSIS — G479 Sleep disorder, unspecified: Secondary | ICD-10-CM

## 2020-08-28 LAB — COMPREHENSIVE METABOLIC PANEL
ALT: 27 U/L (ref 0–44)
AST: 20 U/L (ref 15–41)
Albumin: 2.6 g/dL — ABNORMAL LOW (ref 3.5–5.0)
Alkaline Phosphatase: 34 U/L — ABNORMAL LOW (ref 38–126)
Anion gap: 7 (ref 5–15)
BUN: 19 mg/dL (ref 6–20)
CO2: 27 mmol/L (ref 22–32)
Calcium: 8.2 mg/dL — ABNORMAL LOW (ref 8.9–10.3)
Chloride: 100 mmol/L (ref 98–111)
Creatinine, Ser: 0.73 mg/dL (ref 0.61–1.24)
GFR, Estimated: >60 mL/min (ref >60)
Glucose, Bld: 118 mg/dL — ABNORMAL HIGH (ref 70–99)
Potassium: 4.3 mmol/L (ref 3.5–5.1)
Sodium: 134 mmol/L — ABNORMAL LOW (ref 135–145)
Total Bilirubin: 0.7 mg/dL (ref 0.3–1.2)
Total Protein: 5 g/dL — ABNORMAL LOW (ref 6.5–8.1)

## 2020-08-28 LAB — GLUCOSE, CAPILLARY
Glucose-Capillary: 127 mg/dL — ABNORMAL HIGH (ref 70–99)
Glucose-Capillary: 123 mg/dL — ABNORMAL HIGH (ref 70–99)
Glucose-Capillary: 122 mg/dL — ABNORMAL HIGH (ref 70–99)
Glucose-Capillary: 138 mg/dL — ABNORMAL HIGH (ref 70–99)

## 2020-08-28 LAB — CBC WITH DIFFERENTIAL/PLATELET
Abs Immature Granulocytes: 3.75 10*3/uL — ABNORMAL HIGH (ref 0.00–0.07)
Basophils Absolute: 0 10*3/uL (ref 0.0–0.1)
Basophils Relative: 0 %
Eosinophils Absolute: 0 10*3/uL (ref 0.0–0.5)
Eosinophils Relative: 0 %
HCT: 24.5 % — ABNORMAL LOW (ref 39.0–52.0)
Hemoglobin: 8.7 g/dL — ABNORMAL LOW (ref 13.0–17.0)
Immature Granulocytes: 12 %
Lymphocytes Relative: 8 %
Lymphs Abs: 2.3 10*3/uL (ref 0.7–4.0)
MCH: 32.3 pg (ref 26.0–34.0)
MCHC: 35.5 g/dL (ref 30.0–36.0)
MCV: 91.1 fL (ref 80.0–100.0)
Monocytes Absolute: 1.8 10*3/uL — ABNORMAL HIGH (ref 0.1–1.0)
Monocytes Relative: 6 %
Neutro Abs: 22.8 10*3/uL — ABNORMAL HIGH (ref 1.7–7.7)
Neutrophils Relative %: 74 %
Platelets: 370 10*3/uL (ref 150–400)
RBC: 2.69 MIL/uL — ABNORMAL LOW (ref 4.22–5.81)
RDW: 16.4 % — ABNORMAL HIGH (ref 11.5–15.5)
WBC: 31 10*3/uL — ABNORMAL HIGH (ref 4.0–10.5)
nRBC: 0.6 % — ABNORMAL HIGH (ref 0.0–0.2)

## 2020-08-28 MED ORDER — TOPIRAMATE 25 MG PO TABS
25.0000 mg | ORAL_TABLET | Freq: Every day | ORAL | Status: DC
  Administered 2020-08-28 – 2020-08-29 (×2): 25 mg via ORAL
  Filled 2020-08-28 (×2): qty 1

## 2020-08-28 MED ORDER — TRAZODONE HCL 50 MG PO TABS
50.0000 mg | ORAL_TABLET | Freq: Every day | ORAL | Status: DC
  Administered 2020-08-28: 50 mg via ORAL
  Filled 2020-08-28: qty 1

## 2020-08-28 MED ORDER — ENSURE MAX PROTEIN PO LIQD
11.0000 [oz_av] | Freq: Every day | ORAL | Status: DC
  Administered 2020-08-28 – 2020-09-14 (×15): 11 [oz_av] via ORAL
  Filled 2020-08-28: qty 330

## 2020-08-28 NOTE — Progress Notes (Signed)
Inpatient Rehabilitation Medication Review by a Pharmacist  A complete drug regimen review was completed for this patient to identify any potential clinically significant medication issues.  Clinically significant medication issues were identified:  no  Check AMION for pharmacist assigned to patient if future medication questions/issues arise during this admission.  Pharmacist comments: Medications reviewed, no issues noted.  Time spent performing this drug regimen review (minutes):  [place], PharmD., MS 08/28/2020 8:43 AM

## 2020-08-28 NOTE — Progress Notes (Signed)
Bilateral lower extremity venous duplex has been completed. Preliminary results can be found in CV Proc through chart review.  Results were given to the patient's nurse, Josh.  08/28/20 10:59 AM Carlos Levering RVT

## 2020-08-28 NOTE — Plan of Care (Signed)
  Problem: RH Balance Goal: LTG Patient will maintain dynamic sitting balance (PT) Description: LTG:  Patient will maintain dynamic sitting balance with assistance during mobility activities (PT) Flowsheets (Taken 08/28/2020 1516) LTG: Pt will maintain dynamic sitting balance during mobility activities with:: Supervision/Verbal cueing Goal: LTG Patient will maintain dynamic standing balance (PT) Description: LTG:  Patient will maintain dynamic standing balance with assistance during mobility activities (PT) Flowsheets (Taken 08/28/2020 1516) LTG: Pt will maintain dynamic standing balance during mobility activities with:: Minimal Assistance - Patient > 75%   Problem: RH Bed Mobility Goal: LTG Patient will perform bed mobility with assist (PT) Description: LTG: Patient will perform bed mobility with assistance, with/without cues (PT). Flowsheets (Taken 08/28/2020 1516) LTG: Pt will perform bed mobility with assistance level of: Minimal Assistance - Patient > 75%   Problem: RH Bed to Chair Transfers Goal: LTG Patient will perform bed/chair transfers w/assist (PT) Description: LTG: Patient will perform bed to chair transfers with assistance (PT). Flowsheets (Taken 08/28/2020 1516) LTG: Pt will perform Bed to Chair Transfers with assistance level: Minimal Assistance - Patient > 75%   Problem: RH Car Transfers Goal: LTG Patient will perform car transfers with assist (PT) Description: LTG: Patient will perform car transfers with assistance (PT). Flowsheets (Taken 08/28/2020 1516) LTG: Pt will perform car transfers with assist:: Moderate Assistance - Patient 50 - 74%   Problem: RH Ambulation Goal: LTG Patient will ambulate in controlled environment (PT) Description: LTG: Patient will ambulate in a controlled environment, # of feet with assistance (PT). Flowsheets (Taken 08/28/2020 1516) LTG: Pt will ambulate in controlled environ  assist needed:: Moderate Assistance - Patient 50 - 74% LTG:  Ambulation distance in controlled environment: 62ft with LRAD Goal: LTG Patient will ambulate in home environment (PT) Description: LTG: Patient will ambulate in home environment, # of feet with assistance (PT). Flowsheets (Taken 08/28/2020 1516) LTG: Pt will ambulate in home environ  assist needed:: Moderate Assistance - Patient 50 - 74% LTG: Ambulation distance in home environment: 12ft with LRAD   Problem: RH Wheelchair Mobility Goal: LTG Patient will propel w/c in controlled environment (PT) Description: LTG: Patient will propel wheelchair in controlled environment, # of feet with assist (PT) Flowsheets (Taken 08/28/2020 1516) LTG: Pt will propel w/c in controlled environ  assist needed:: Supervision/Verbal cueing LTG: Propel w/c distance in controlled environment: 169ft Goal: LTG Patient will propel w/c in home environment (PT) Description: LTG: Patient will propel wheelchair in home environment, # of feet with assistance (PT). Flowsheets (Taken 08/28/2020 1516) LTG: Pt will propel w/c in home environ  assist needed:: Supervision/Verbal cueing Distance: wheelchair distance in controlled environment: 50   Problem: RH Stairs Goal: LTG Patient will ambulate up and down stairs w/assist (PT) Description: LTG: Patient will ambulate up and down # of stairs with assistance (PT) Flowsheets (Taken 08/28/2020 1516) LTG: Pt will ambulate up/down stairs assist needed:: Moderate Assistance - Patient 50 - 74% LTG: Pt will  ambulate up and down number of stairs: 2 steps with no rails

## 2020-08-28 NOTE — Evaluation (Signed)
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 High blood pressure   . Obesity   . 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 ROTATOR CUFF REPAIR Left 2015    Assessment & Plan Clinical Impression: Deval Mroczka is a 50 year old LH-male with history of HTN, OSA?, obesity, recent CVA with mild left sided weakness who was evaluated at [facility] ED on 08/17/20 after fall with brief LOC and reports to 2 week history of progressive left sided weakness with lethargy. He was found to have large heterogenous mass concerning for GBM centered in deep right frontoparietal region extending to right basal ganglia with prominent vasogenic edema with partial effacement of left lateral ventricle and 7 mm midline shift as well as flattening of optic discs s/o increased ICP. He did have seizure in [facility] ED, was started on Keppra as well as decadron and transferred to [facility] for management.   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  On 03/12, He underwent right crani for tumor resection by Dr. Davene Costain. Kathline Magic with pathology revealing high grade glioma. Hospital course significant for somnolence day after surgery due to  progressive cerebral edema which improved with addition of mannitol as well as increase in steroids.  He did develop tachycardia with tarry stools and drop in H/H due to actively bleeding gastric blood vessel which was treated with clipping by GI on 03/12. H/H improved with transfusion of 2 units PRBC and tarry stools continued due to residual blood as H/H stable. Hypotension resolved with d/c of meds and Sq Lovenox added for DVT prophylaxis. Pain managed with tylenol qid and oxycodone prn. Steroids being weaned off. He  has been referred to GI for work up of elevated bilirubin. Patient with resultant left hemiplegia as well as cognitive deficits affecting ADLs and mobility. CIR recommended due to functional decline.   Patient currently requires max with basic self-care skills secondary to muscle weakness, decreased cardiorespiratoy endurance, impaired timing and sequencing, abnormal tone, unbalanced muscle activation, decreased coordination and decreased motor planning, decreased visual perceptual skills, decreased attention to left, decreased attention, decreased awareness, decreased problem solving, decreased safety awareness and decreased memory and decreased sitting balance, decreased standing balance, decreased postural control, hemiplegia and decreased balance strategies.  Prior to hospitalization, patient could complete BADL/IADL with independent .  Patient will benefit from skilled intervention to decrease level of assist with basic self-care skills and increase independence with basic self-care skills prior to discharge home with care partner.  Anticipate patient will require 24 hour supervision and follow up home health.  OT - End of Session Activity Tolerance: Tolerates 30+ min activity with multiple rests Endurance Deficit: Yes OT Assessment Rehab Potential (ACUTE ONLY): Good OT Barriers to Discharge: Weight;Pending chemo/radiation;Incontinence  OT Patient demonstrates impairments in the  following area(s): Balance;Behavior;Cognition;Edema;Endurance;Motor;Nutrition;Perception;Safety;Sensory;Skin Integrity;Vision OT Basic ADL's Functional Problem(s): Eating;Grooming;Bathing;Dressing;Toileting OT Transfers Functional Problem(s): Toilet;Tub/Shower OT Additional Impairment(s): Fuctional Use of Upper Extremity OT Plan OT Intensity: Minimum of 1-2 x/day, 45 to 90 minutes OT Frequency: 5 out of 7 days OT Duration/Estimated Length of Stay: 3.5-4 weeks OT Treatment/Interventions: Balance/vestibular training;Discharge planning;Functional electrical stimulation;Pain management;Self Care/advanced ADL retraining;Therapeutic Activities;UE/LE Coordination activities;Visual/perceptual remediation/compensation;Therapeutic Exercise;Skin care/wound managment;Patient/family education;Functional mobility training;Disease mangement/prevention;Cognitive remediation/compensation;Community reintegration;DME/adaptive equipment instruction;Neuromuscular re-education;Psychosocial support;Splinting/orthotics;UE/LE Strength taining/ROM;Wheelchair propulsion/positioning OT Self Feeding Anticipated Outcome(s): S OT Basic Self-Care Anticipated Outcome(s): S shirt; MIN A LB OT Toileting Anticipated Outcome(s): MIN A OT Bathroom Transfers Anticipated Outcome(s): MIN A OT Recommendation Recommendations for Other Services: Neuropsych consult Patient destination: Home Follow Up Recommendations: Home health OT Equipment Recommended: Tub/shower bench;To be determined   OT Evaluation Precautions/Restrictions  Precautions Precautions: None Restrictions Weight Bearing Restrictions: No General Chart Reviewed: Yes Family/Caregiver Present: No Vital Signs   Pain Pain Assessment Pain Scale: 0-10 Pain Score: 0-No pain Home Living/Prior Functioning Home Living Family/patient expects to be discharged to:: Private residence Living Arrangements: Spouse/significant other,Children Available Help at Discharge:  Family,Available 24 hours/day Type of Home: House Home Access: Stairs to enter [employer] of Steps: 2SE through garage, 3SE through front Entrance Stairs-Rails: None Home Layout: Able to live on main level with bedroom/bathroom Bathroom Shower/Tub: Tub/shower unit,Curtain (pt reports having TTB will confirm wiht fam bc admission sauys shower chair) Bathroom Toilet: Standard Bathroom Accessibility: Yes Additional Comments: Family was providing 24/7 assist since d/c from [facility] in January 2022  Lives With: Spouse,Son IADL History Education: 11th grade Prior Function Vocation: Full time employment Vision Baseline Vision/History: Wears glasses Wears Glasses: Reading only Vision Assessment?: Yes Eye Alignment: Within Functional Limits Ocular Range of Motion: Within Functional Limits Alignment/Gaze Preference: Gaze right Tracking/Visual Pursuits: Decreased smoothness of horizontal tracking;Decreased smoothness of vertical tracking Saccades: Additional eye shifts occurred during testing Visual Fields: Impaired-to be further tested in functional context Perception  Perception: Impaired Inattention/Neglect: Does not attend to left visual field;Does not attend to left side of body Praxis Praxis: Impaired Praxis Impairment Details: Initiation Cognition Overall Cognitive Status: Impaired/Different from baseline Arousal/Alertness: Awake/alert Orientation Level: Person;Place;Situation Person: Oriented Place: Oriented Situation: Oriented Year: 2022 Month: March Day of Week: Correct Memory: Impaired Memory Impairment: Decreased recall of new information Immediate Memory Recall: Sock;Blue;Bed Memory Recall Sock: With Cue Memory Recall Blue: Without Cue Memory Recall Bed: Without Cue Attention: Focused;Sustained Focused Attention: Impaired Focused Attention Impairment: Verbal basic Sustained Attention: Impaired Sustained Attention Impairment: Verbal basic Executive  Function: Sequencing;Organizing Sequencing: Impaired Sequencing Impairment: Verbal basic Organizing: Impaired Organizing Impairment: Verbal basic Safety/Judgment: Impaired Sensation Sensation Light Touch: Impaired by gross assessment Coordination Gross Motor Movements are Fluid and Coordinated: No Fine Motor Movements are Fluid and Coordinated: No Finger Nose Finger Test: RUE WNL; LUE unable Motor     Trunk/Postural Assessment  Cervical Assessment Cervical Assessment:  (head forward) Thoracic Assessment Thoracic Assessment:  (rounded shoudlers) Lumbar Assessment Lumbar Assessment:  (post pelvic tilt) Postural Control Postural Control: Deficits on evaluation (delayed/insufficient)  Balance Balance Balance Assessed: No (defered d/t bed rest. Upon elevating into long sitting to pull shirt down back pt wiht L lean but able to correct to midline when cued to lean to R) Extremity/Trunk Assessment RUE Assessment RUE Assessment: Within Functional Limits LUE Assessment LUE Assessment: Exceptions to [facility] General Strength Comments: tightness along flexor synergy present; trace elbow flexion on command noted LUE Body System: Neuro Brunstrum levels for arm and  hand: Arm;Hand Brunstrum level for arm: Stage II Synergy is developing Brunstrum level for hand: Stage II Synergy is developing  Care Tool Care Tool Self Care Eating        Oral Care    Oral Care Assist Level: Minimal Assistance - Patient > 75%    Bathing   Body parts bathed by patient: Left arm;Chest;Abdomen;Front perineal area;Face Body parts bathed by helper: Right arm;Buttocks;Right upper leg;Left upper leg;Right lower leg;Left lower leg   Assist Level: 2 Helpers (to roll)    Upper Body Dressing(including orthotics)   What is the patient wearing?: Pull over shirt   Assist Level: Minimal Assistance - Patient > 75%    Lower Body Dressing (excluding footwear)   What is the patient wearing?: Underwear/pull up Assist  for lower body dressing: 2 Helpers    Putting on/Taking off footwear   What is the patient wearing?: Non-skid slipper socks Assist for footwear: Dependent - Patient 0%       Care Tool Toileting Toileting activity   Assist for toileting: 2 Helpers     Care Tool Bed Mobility Roll left and right activity   Roll left and right assist level: Maximal Assistance - Patient 25 - 49%    Sit to lying activity Sit to lying activity did not occur: Safety/medical concerns      Lying to sitting edge of bed activity Lying to sitting edge of bed activity did not occur: Safety/medical concerns       Care Tool Transfers Sit to stand transfer Sit to stand activity did not occur: Safety/medical concerns      Chair/bed transfer Chair/bed transfer activity did not occur: Safety/medical concerns       Toilet transfer Toilet transfer activity did not occur: Safety/medical concerns       Care Tool Cognition Expression of Ideas and Wants Expression of Ideas and Wants: Some difficulty - exhibits some difficulty with expressing needs and ideas (e.g, some words or finishing thoughts) or speech is not clear   Understanding Verbal and Non-Verbal Content Understanding Verbal and Non-Verbal Content: Usually understands - understands most conversations, but misses some part/intent of message. Requires cues at times to understand   Memory/Recall Ability *first 3 days only Memory/Recall Ability *first 3 days only: Current season;Location of own room;Staff names and faces;That he or she is in a hospital/hospital unit    Refer to Care Plan for [place] 1 OT Short Term Goal 1 (Week 1): Pt will sit EOB wiht MIN A for dynamic sitting balnace OT Short Term Goal 2 (Week 1): Pt will transfer wiht MOD A to BSC and LRAD OT Short Term Goal 3 (Week 1): Pt will locate all grooming items on L of sink OT Short Term Goal 4 (Week 1): Pt will don shirt wiht S  Recommendations for other  services: Neuropsych   Skilled Therapeutic Intervention 1:1. Pt received in bed agreeable to OT after educaiton on OT role/purpose, CIR, ELOS, POC, BI recovery, L inattention, NMR and conference/interdisciplinary team dynamic. Pt with new DVT this morning and on bed rest. Pt engages in DC planning with OT and goal setting after full ADL at bed level as written below. Pt with dense hemi in L side and will require significant intense rehab to recover function of L extremities/trunk. Exited session with pt seated in bed, exit alarm on and call light in reach   ADL   bed level ADL performed d/t pt on bed rest. +  2 A utilized to roll with hemi side and change sheets, brief and shirt as all soiled with urine.  Mobility  Bed Mobility Bed Mobility: Rolling Left;Rolling Right Rolling Right: 2 Helpers Rolling Left: Minimal Assistance - Patient > 75%   Discharge Criteria: Patient will be discharged from OT if patient refuses treatment 3 consecutive times without medical reason, if treatment goals not met, if there is a change in medical status, if patient makes no progress towards goals or if patient is discharged from hospital.  The above assessment, treatment plan, treatment alternatives and goals were discussed and mutually agreed upon: by patient  Tonny Branch 08/28/2020, 12:30 PM

## 2020-08-28 NOTE — Evaluation (Signed)
Physical Therapy Assessment and Plan  Patient Details  Name: Todd Villa MRN: [id number] Date of Birth: 05-Aug-1970  PT Diagnosis: Abnormal posture, Abnormality of gait, Coordination disorder, Hemiplegia dominant, Hypotonia, Impaired sensation and Muscle weakness Rehab Potential: Good ELOS: 3-4 weeks   Today's Date: 08/28/2020 PT Individual Time: 5465-0354 PT Individual Time Calculation (min): 45 min    Hospital Problem: Principal Problem:   Glioblastoma multiforme of brain ([place]) Active Problems:   Acute deep vein thrombosis (DVT) of popliteal vein of left lower extremity (HCC)   Vascular headache   Sleep disorder   Left hemiparesis ([place])   Past Medical History:  Past Medical History:  Diagnosis Date  . High blood pressure   . Obesity   . 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 ROTATOR CUFF REPAIR Left 2015    Assessment & Plan Clinical Impression: Patient is a 50 year old LH-male with history of HTN, OSA?, obesity, recent CVA with mild left sided weakness who was evaluated at [facility] ED on 08/17/20 after fall with brief LOC and reports to 2 week history of progressive left sided weakness with lethargy. He was found to have large heterogenous mass concerning for GBM centered in deep right frontoparietal region extending to right basal ganglia with prominent vasogenic edema with partial effacement of left lateral ventricle and 7 mm midline shift as well as flattening of optic discs s/o increased ICP. He did have seizure in [facility] ED, was started on Keppra as well as decadron and transferred to [facility] for management.   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  On 03/12, He underwent right crani for tumor resection by Dr. Davene Costain. Kathline Magic with pathology revealing high grade glioma. Hospital course significant for somnolence day after surgery due to progressive cerebral edema which improved  with addition of mannitol as well as increase in steroids.  He did develop tachycardia with tarry stools and drop in H/H due to actively bleeding gastric blood vessel which was treated with clipping by GI on 03/12. H/H improved with transfusion of 2 units PRBC and tarry stools continued due to residual blood as H/H stable. Hypotension resolved with d/c of meds and Sq Lovenox added for DVT prophylaxis. Pain managed with tylenol qid and oxycodone prn. Steroids being weaned off. He  has been referred to GI for work up of elevated bilirubin. Patient with resultant left hemiplegia as well as cognitive deficits affecting ADLs and mobility. Patient transferred to CIR on 08/27/2020 .   Patient currently requires total with mobility secondary to muscle weakness, muscle joint tightness and muscle paralysis, decreased cardiorespiratoy endurance, abnormal tone, unbalanced muscle activation, decreased coordination and decreased motor planning, decreased midline orientation and decreased attention to left, decreased initiation, decreased attention, decreased awareness, decreased problem solving and decreased safety awareness and decreased sitting balance, decreased standing balance, decreased postural control, hemiplegia and decreased balance strategies.  Prior to hospitalization, patient was modified independent  with mobility and lived with Spouse,Son in a House home.  Home access is 2 steps through garage, 3 steps through frontStairs to enter.  Patient will benefit from skilled PT intervention to maximize safe functional mobility, minimize fall risk and decrease caregiver burden for planned discharge home with 24 hour assist.  Anticipate patient will benefit from follow up [facility] at discharge.  PT - End of Session Activity Tolerance: Tolerates < 10 min activity with changes in vital signs Endurance  Deficit: Yes PT Assessment Rehab Potential (ACUTE/IP ONLY): Good PT Barriers to Discharge: Inaccessible home  environment;Home environment access/layout;Insurance for SNF coverage;Weight;Pending surgery PT Patient demonstrates impairments in the following area(s): Balance;Behavior;Edema;Endurance;Motor;Perception;Safety;Sensory;Skin Integrity PT Transfers Functional Problem(s): Bed Mobility;Bed to Chair;Car;Furniture;Floor PT Locomotion Functional Problem(s): Ambulation;Stairs;Wheelchair Mobility PT Plan PT Intensity: Minimum of 1-2 x/day ,45 to 90 minutes PT Frequency: 5 out of 7 days PT Duration Estimated Length of Stay: 3-4 weeks PT Treatment/Interventions: Ambulation/gait training;Balance/vestibular training;Cognitive remediation/compensation;Community reintegration;Discharge planning;Disease management/prevention;DME/adaptive equipment instruction;Neuromuscular re-education;Functional mobility training;Functional electrical stimulation;Pain management;Patient/family education;Skin care/wound management;Psychosocial support;Splinting/orthotics;Stair training;Therapeutic Activities;Therapeutic Exercise;UE/LE Coordination activities;Visual/perceptual remediation/compensation;Wheelchair propulsion/positioning;UE/LE Strength taining/ROM PT Transfers Anticipated Outcome(s): Min assist with LRAD PT Locomotion Anticipated Outcome(s): Supervision assist WC mobility. min assist gait for short distances and LRAD PT Recommendation Follow Up Recommendations: Home health PT Patient destination: Home Equipment Recommended: Wheelchair cushion (measurements);Wheelchair (measurements);To be determined   PT Evaluation Precautions/Restrictions   General   Vital SignsTherapy Vitals Temp: 98 F (36.7 C) Temp Source: Oral Pulse Rate: 70 Resp: 15 BP: 125/71 Patient Position (if appropriate): Lying Oxygen Therapy SpO2: 99 % O2 Device: Room Air Pain   Home Living/Prior Functioning Home Living Available Help at Discharge: Family;Available 24 hours/day Type of Home: House Home Access: Stairs to  enter [employer] of Steps: 2 steps through garage, 3 steps through front Entrance Stairs-Rails: None Home Layout: Able to live on main level with bedroom/bathroom;Two level Alternate Level Stairs-Number of Steps: 10-12 Alternate Level Stairs-Rails: Left Bathroom Shower/Tub: Tub/shower unit;Curtain (pt reports having TTB will confirm wiht fam bc admission sauys shower chair) Bathroom Toilet: Standard Bathroom Accessibility: Yes Additional Comments: Family was providing 24/7 assist since d/c from [facility] in January 2022  Lives With: Spouse;Son Prior Function Level of Independence: Needs assistance with ADLs;Requires assistive device for independence;Independent with gait  Able to Take Stairs?: Yes Vocation: Full time employment Comments: CVA in January 2022. had progressmod I with no AD and 2 weeks prior to admission, began needing RW  or SPC at all times Vision/Perception  Vision - Assessment Eye Alignment: Within Functional Limits Ocular Range of Motion: Within Functional Limits Alignment/Gaze Preference: Gaze right Tracking/Visual Pursuits: Decreased smoothness of horizontal tracking;Decreased smoothness of vertical tracking;Decreased smoothness of eye movement to LEFT superior field Saccades: Additional eye shifts occurred during testing Perception Perception: Within Functional Limits Inattention/Neglect: Does not attend to left visual field;Does not attend to left side of body Praxis Praxis: Impaired Praxis Impairment Details: Initiation;Ideomotor  Cognition Overall Cognitive Status: Impaired/Different from baseline Arousal/Alertness: Awake/alert Attention: Focused;Sustained Focused Attention: Impaired Focused Attention Impairment: Verbal basic Sustained Attention: Impaired Sustained Attention Impairment: Verbal basic Memory: Impaired Memory Impairment: Decreased recall of new information Executive Function: Sequencing;Organizing Sequencing: Impaired Sequencing  Impairment: Verbal basic Organizing: Impaired Organizing Impairment: Verbal basic Safety/Judgment: Impaired Sensation Sensation Light Touch: Impaired Detail Light Touch Impaired Details: Impaired LLE;Impaired LUE Proprioception: Appears Intact Coordination Gross Motor Movements are Fluid and Coordinated: No Fine Motor Movements are Fluid and Coordinated: No Coordination and Movement Description: dense L hemiplegia UE>LE Finger Nose Finger Test: RUE WNL; LUE unable Heel Shin Test: unable to perform on the L. WFL on the R Motor  Motor Motor: Hemiplegia Motor - Skilled Clinical Observations: dense L sided Hemiplegia   Trunk/Postural Assessment  Cervical Assessment Cervical Assessment: Exceptions to [facility] (head forward and mild R preference) Thoracic Assessment Thoracic Assessment: Exceptions to [facility] (rounded shoudlers) Lumbar Assessment Lumbar Assessment: Exceptions to [facility] (post pelvic tilt) Postural Control Postural Control: Deficits on evaluation (delayed/insufficient)  Balance Balance Balance Assessed: No (defered d/t  bed rest. Upon elevating into long sitting to pull shirt down back pt wiht L lean but able to correct to midline when cued to lean to R) Extremity Assessment  RUE Assessment RUE Assessment: Within Functional Limits LUE Assessment LUE Assessment: Exceptions to [facility] General Strength Comments: tightness along flexor synergy present; trace elbow flexion on command noted LUE Body System: Neuro Brunstrum levels for arm and hand: Arm;Hand Brunstrum level for arm: Stage II Synergy is developing Brunstrum level for hand: Stage II Synergy is developing RLE Assessment RLE Assessment: Within Functional Limits LLE Assessment LLE Assessment: Exceptions to [facility] General Strength Comments: 2/5 hip flexion and extension. 2/5 knee extension in symmetry. all others 0/5  Care Tool Care Tool Bed Mobility Roll left and right activity   Roll left and right assist level: Maximal  Assistance - Patient 25 - 49%    Sit to lying activity Sit to lying activity did not occur: Safety/medical concerns      Lying to sitting edge of bed activity Lying to sitting edge of bed activity did not occur: Safety/medical concerns       Care Tool Transfers Sit to stand transfer Sit to stand activity did not occur: Safety/medical concerns      Chair/bed transfer Chair/bed transfer activity did not occur: Safety/medical concerns       Toilet transfer Toilet transfer activity did not occur: Safety/medical concerns      Scientist, product/process development transfer activity did not occur: Safety/medical concerns        Care Tool Locomotion Ambulation Ambulation activity did not occur: Safety/medical concerns        Walk 10 feet activity Walk 10 feet activity did not occur: Safety/medical concerns       Walk 50 feet with 2 turns activity Walk 50 feet with 2 turns activity did not occur: Safety/medical concerns      Walk 150 feet activity Walk 150 feet activity did not occur: Safety/medical concerns      Walk 10 feet on uneven surfaces activity Walk 10 feet on uneven surfaces activity did not occur: Safety/medical concerns      Stairs Stair activity did not occur: Safety/medical concerns        Walk up/down 1 step activity Walk up/down 1 step or curb (drop down) activity did not occur: Safety/medical concerns        Walk up/down 4 steps activity      Walk up/down 12 steps activity Walk up/down 12 steps activity did not occur: Safety/medical concerns      Pick up small objects from floor Pick up small object from the floor (from standing position) activity did not occur: Safety/medical concerns      Wheelchair Will patient use wheelchair at discharge?: Yes   Wheelchair activity did not occur: Safety/medical concerns      Wheel 50 feet with 2 turns activity Wheelchair 50 feet with 2 turns activity did not occur: Safety/medical concerns    Wheel 150 feet activity Wheelchair  150 feet activity did not occur: Safety/medical concerns      Refer to Care Plan for Long Term Goals  SHORT TERM GOAL WEEK 1 PT Short Term Goal 1 (Week 1): Pt will perform bed mobility with max assist of 1. PT Short Term Goal 2 (Week 1): Pt will transfer to and from [facility] with max assist PT Short Term Goal 3 (Week 1): Pt will propel WC 3f with min assist  Recommendations for other services: Neuropsych and Therapeutic Recreation  Stress management  Skilled Therapeutic Intervention  Pt received supine in bed and agreeable to PT. PT instructed patient in PT Evaluation and initiated treatment intervention; see above for results. PT educated patient in [place], rehab potential, rehab goals, and discharge recommendations along with recommendation for follow-up rehabilitation services. Rolling R and L x 3 Bil with min-mod assist to the L and max assist to the R. Pt performed shoulder/elbow extension x 10 . Hip/knee flexion extension x 10 BLE. SLR on the RLE. Pt then left in bed with call bell in reach and wife present.   Mobility Bed Mobility Bed Mobility: Rolling Left;Rolling Right Rolling Right: 2 Helpers Rolling Left: Minimal Assistance - Patient > 75% Transfers Transfers:  (pt on bed rest due to DVT) Locomotion  Gait Ambulation: No Gait Gait: No Stairs / Additional Locomotion Stairs: No Wheelchair Mobility Wheelchair Mobility: No   Discharge Criteria: Patient will be discharged from PT if patient refuses treatment 3 consecutive times without medical reason, if treatment goals not met, if there is a change in medical status, if patient makes no progress towards goals or if patient is discharged from hospital.  The above assessment, treatment plan, treatment alternatives and goals were discussed and mutually agreed upon: by patient and by family  Lorie Phenix 08/28/2020, 3:08 PM

## 2020-08-28 NOTE — Evaluation (Signed)
Speech Language Pathology Assessment and Plan  Patient Details  Name: Todd Villa MRN: [id number] Date of Birth: March 12, 1971  SLP Diagnosis: Cognitive Impairments  Rehab Potential: Excellent ELOS: 2 to 3 weeks   Today's Date: 08/28/2020 SLP Individual Time: 6644-0347 SLP Individual Time Calculation (min): 54 min  Hospital Problem: Principal Problem:   Glioblastoma multiforme of brain [facility])  Past Medical History:  Past Medical History:  Diagnosis Date  . High blood pressure   . Obesity   . 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 ROTATOR CUFF REPAIR Left 2015    Assessment / Plan / Recommendation Clinical Impression Pt is a 50 y/o male with recent admission to [facility] in January of 2022 where he was diagnosed with a R basal ganglia CVA and d/c'd home with family support. He presented to [facility] again per his PCP direction on 3/8 with worsening symptoms. Imaging revealed R frontal intracranial mass with significant edema and 7 mm midline shift. On admit to [facility] pt afebrile, hemodynamically stable, and labs only notable for elevated WBC 19, though he had received 10 mg of decadron 6 hrs prior. Pt endorsed dizziness, falls, throbbing headaches that have worsened over the week prior to admission, as well as new onset incontinence B/B. Pt was transferred to [facility] on 3/8 for planned resection. PMH of HTN, CVA, and OSA. Pt underwent tumor resection on 3/10 per Dr. 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 Post op course complicated by hypotension, ABLA, and increased somnolence on POD 1, imaging showing increased edema and midline shift, but improved with increased dose of decadron. Post operatively also noted to have black, tarry stool x2 with significant Hgb drop and GI workup confirmed bleeding stomach vessel, which was clipped. Hgb now stable. Pt tolerating a regular diet, though does  present with some cognitive deficits. PT/OT evaluations were completed and pt was recommended for a comprehensive inpatient rehabilitation program.  SLP administered the [place] Status Examination (SLUMS) and patient scored 18/30 points [facility] = 27+).  Patient demonstrated mild-moderate cognitive deficits in working memory, verbal fluency, mathematics, executive function and writing/scanning with noted decreased insight into these deficits. Pt very responsive to verbal and visual cues to increase scanning to left of page for reading and writing tasks. Pt immediate and short term recall presents with mild impairment during assessment, more severe deficits in mental flexibility. Informal assessment revealed deficits in planning and executing higher level cognitive tasks.   Pt reports minimal dysarthria ("slurred speech") that occurs primarily in the afternoon/evening when he fatigues. Pt with 100% intelligibility at conversation level during assessment. Pt will benefit from education and strategies for inconsistent dysarthric speech.   Pt seen with AM meal of regular solids and thin liquids, pt tolerating with no overt s/s aspiration or difficulty. Recommend continuing current diet. Due to high level of independence at baseline, patient would benefit from skilled SLP intervention to maximize his overall cognitive functioning. Patient verbalized understanding and agreement.    Skilled Therapeutic Interventions          Administered a cognitive-linguistic evaluation, please see above for details.   SLP Assessment  Patient will need skilled [place] [facility] during CIR admission    Recommendations  Recommendations for Other Services: Neuropsych consult Patient destination: Home Follow up Recommendations: Outpatient SLP Equipment Recommended: To be determined    SLP Frequency 3 to 5 out of 7 days  SLP Duration  SLP Intensity  SLP Treatment/Interventions 2 to 3  weeks  Minumum of 1-2 x/day, 30 to 90 minutes  Cognitive remediation/compensation;Functional tasks;Cueing hierarchy;Therapeutic Activities;Therapeutic Exercise;Medication managment;Patient/family education    Pain Pain Assessment Pain Scale: 0-10 Pain Score: 0-No pain  Prior Functioning Cognitive/Linguistic Baseline: Within functional limits Type of Home: House  Lives With: Spouse;Son Available Help at Discharge: Family;Available 24 hours/day Education: 11th grade Vocation: Full time employment  SLP Evaluation Cognition Overall Cognitive Status: Impaired/Different from baseline Arousal/Alertness: Awake/alert Orientation Level: Oriented X4 Attention: Focused;Sustained Focused Attention: Impaired Focused Attention Impairment: Verbal basic Sustained Attention: Impaired Sustained Attention Impairment: Verbal basic Memory: Impaired Memory Impairment: Decreased recall of new information Executive Function: Sequencing;Organizing Sequencing: Impaired Sequencing Impairment: Verbal basic Organizing: Impaired Organizing Impairment: Verbal basic Safety/Judgment: Impaired  Comprehension Auditory Comprehension Overall Auditory Comprehension: Appears within functional limits for tasks assessed Yes/No Questions: Within Functional Limits Commands: Within Functional Limits Conversation: Simple Visual Recognition/Discrimination Discrimination: Exceptions to [place] Reading Comprehension Reading Status: Within funtional limits Expression Expression Primary Mode of Expression: Verbal Verbal Expression Overall Verbal Expression: Appears within functional limits for tasks assessed Level of Generative/Spontaneous Verbalization: Conversation Repetition: No impairment Naming: No impairment Written Expression Dominant Hand: Left (in brace so patient is utilizing right hand at this time) Written Expression: Exceptions to [facility] Oral Motor Oral Motor/Sensory Function Overall Oral  Motor/Sensory Function: Within functional limits Motor Speech Overall Motor Speech: Appears within functional limits for tasks assessed (pt reports mild dysarthria in afternoon, none observed during tx session) Respiration: Within functional limits Phonation: Normal Articulation: Within functional limitis Motor Speech Errors: Aware Effective Techniques: Over-articulate  Care Tool Care Tool Cognition Expression of Ideas and Wants Expression of Ideas and Wants: Some difficulty - exhibits some difficulty with expressing needs and ideas (e.g, some words or finishing thoughts) or speech is not clear   Understanding Verbal and Non-Verbal Content Understanding Verbal and Non-Verbal Content: Usually understands - understands most conversations, but misses some part/intent of message. Requires cues at times to understand   Memory/Recall Ability *first 3 days only Memory/Recall Ability *first 3 days only: Current season;Location of own room;Staff names and faces;That he or she is in a hospital/hospital unit    Short Term Goals: Week 1: SLP Short Term Goal 1 (Week 1): Patient will demonstrate recall for new, daily information with supervision level verbal cues. SLP Short Term Goal 2 (Week 1): Patient will demonstrate selective attention in a mildly distracting enviornment for ~10 minutes with min A verbal cues. SLP Short Term Goal 3 (Week 1): Patient will demonstrate functional problem solving for mildly complex tasks with Supervision level cues. SLP Short Term Goal 4 (Week 1): Pt will increase left field scanning to increase safety during functional activities (mobility, reading, writing, etc) provided mod A verbal and visual cues SLP Short Term Goal 5 (Week 1): Pt will understand, recall and utilize compensatory strategies to increase speech intelligiblity with Supervision level cues.  Refer to Care Plan for Long Term Goals  Recommendations for other services: Neuropsych  Discharge Criteria:  Patient will be discharged from SLP if patient refuses treatment 3 consecutive times without medical reason, if treatment goals not met, if there is a change in medical status, if patient makes no progress towards goals or if patient is discharged from hospital.  The above assessment, treatment plan, treatment alternatives and goals were discussed and mutually agreed upon: by patient  Dewaine Conger 08/28/2020, 9:02 AM

## 2020-08-28 NOTE — H&P (Signed)
Chief Complaint: Left lower extremity DVT  Referring Physician(s): Meredith Staggers, MD  Supervising Physician: Arne Cleveland  Patient Status: [facility] - In-pt  History of Present Illness: Todd Villa is a 50 y.o. male with history of hypertension, obesity, and recent CVA with mild left-sided weakness.  He was evaluated at [facility] on August 17, 2020 after a fall with a brief loss of consciousness.  He was found to have a large heterogeneous mass concerning for GBM centered in the right frontoparietal region extending to the right basal ganglia with prominent vasogenic edema with partial effacement of the left ventricle and 7 mm midline shift as well as flattening of the optic disks.  On August 21, 2020 he underwent a right craniotomy for tumor resection by Dr. Lacinda Axon and Dr. Kathline Magic.  Pathology revealed high grade glioma.  He developed tarry stools and drop in hemoglobin due to actively bleeding gastric blood vessel which was treated with clipping by GI on August 21, 2020.   Hemoglobin improved with transfusion of 2 units of packed red blood cells.  Unfortunately he has developed a left lower extremity DVT and he is unable to be anticoagulated secondary to recent craniotomy and anemia.  We are asked to place an inferior vena cava filter.  Past Medical History:  Diagnosis Date  . High blood pressure   . Obesity   . 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 ROTATOR CUFF REPAIR Left 2015    Allergies: Penicillins  Medications: Prior to Admission medications   Medication Sig Start Date End Date Taking? Authorizing Provider  acetaminophen (TYLENOL) 325 MG tablet Take 975 mg by mouth every 8 (eight) hours.   Yes [provider]  atorvastatin (LIPITOR) 10 MG tablet Take 10 mg by mouth daily.   Yes [provider]  bacitracin 500 UNIT/GM ointment Apply 1 application topically daily. Apply to  incision site for 14 days.   Yes [provider]  bisacodyl (DULCOLAX) 10 MG suppository Place 10 mg rectally daily as needed for moderate constipation.   Yes [provider]  dexamethasone (DECADRON) 4 MG tablet Take 4 mg by mouth 2 (two) times daily.   Yes [provider]  enoxaparin (LOVENOX) 40 MG/0.4ML injection Inject 40 mg into the skin daily. For 30 days   Yes [provider]  FLUoxetine (PROZAC) 20 MG capsule Take 20 mg by mouth daily.   Yes [provider]  HYDRALAZINE HCL IJ Inject 10 mg as directed every 6 (six) hours as needed (SBP >160 (second line)).   Yes [provider]  hydrochlorothiazide (MICROZIDE) 12.5 MG capsule Take 12.5 mg by mouth daily. Hold for SBP <90   Yes [provider]  LABETALOL HCL IV Inject 10 mg into the vein every 30 (thirty) minutes as needed (SBP >160 (first line)).   Yes [provider]  levETIRAcetam (KEPPRA) 1000 MG tablet Take 1,000 mg by mouth every 12 (twelve) hours.   Yes [provider]  losartan (COZAAR) 100 MG tablet Take 100 mg by mouth daily. Hold for SBP <90   Yes [provider]  ondansetron (ZOFRAN) 4 MG/2ML SOLN injection Inject 4 mg into the vein every 8 (eight) hours as needed for nausea or vomiting.   Yes [provider]  oxyCODONE (OXY IR/ROXICODONE) 5 MG immediate release tablet Take 5 mg by mouth every 4 (four) hours as needed (for pain not relieved by tylenol).  Yes [provider]  pantoprazole (PROTONIX) 40 MG tablet Take 40 mg by mouth daily.   Yes [provider]  polyethylene glycol (MIRALAX / GLYCOLAX) 17 g packet Take 17 g by mouth daily.   Yes [provider]  senna-docusate (SENOKOT-S) 8.6-50 MG tablet Take 2 tablets by mouth 2 (two) times daily.   Yes [provider]     Family History  Problem Relation Age of Onset  . Hyperlipidemia Mother   . Heart disease Mother   . Hypertension Mother    . Diabetes Mother   . Alcohol abuse Father   . Heart disease Father   . Hypertension Father   . Diabetes Sister   . Cancer Neg Hx   . Stroke Neg Hx     Social History   Socioeconomic History  . Marital status: Married    Spouse name: Not on file  . Number of children: Not on file  . Years of education: Not on file  . Highest education level: Not on file  Occupational History  . Not on file  Tobacco Use  . Smoking status: Current Every Day Smoker    Packs/day: 0.66    Years: 30.00    Pack years: 19.80    Types: Cigarettes  . Smokeless tobacco: Never Used  Substance and Sexual Activity  . Alcohol use: No    Alcohol/week: 0.0 standard drinks  . Drug use: No  . Sexual activity: Yes  Other Topics Concern  . Not on file  Social History Narrative  . Not on file   Social Determinants of Health   Financial Resource Strain: Not on file  Food Insecurity: Not on file  Transportation Needs: Not on file  Physical Activity: Not on file  Stress: Not on file  Social Connections: Not on file     Review of Systems: A 12 point ROS discussed and pertinent positives are indicated in the HPI above.  All other systems are negative.  Review of Systems  Vital Signs: BP 125/71 (BP Location: Left Arm)   Pulse 70   Temp 98 F (36.7 C) (Oral)   Resp 15   Ht 6\' 3"  (1.905 m)   Wt (!) 147.5 kg   SpO2 99%   BMI 40.64 kg/m   Physical Exam Vitals reviewed.  HENT:     Head:     Comments: Right cranial incision, appears to be healing nicely. Eyes:     Extraocular Movements: Extraocular movements intact.  Cardiovascular:     Rate and Rhythm: Normal rate and regular rhythm.  Pulmonary:     Effort: Pulmonary effort is normal. No respiratory distress.     Breath sounds: Normal breath sounds.  Musculoskeletal:        General: Swelling present.     Cervical back: Normal range of motion.  Skin:    General: Skin is warm and dry.  Neurological:     General: No focal deficit  present.     Mental Status: He is alert and oriented to person, place, and time.  Psychiatric:        Mood and Affect: Mood normal.        Behavior: Behavior normal.        Thought Content: Thought content normal.        Judgment: Judgment normal.     Imaging: CT Head Wo Contrast  Result Date: 08/17/2020 CLINICAL DATA:  Recent falls. EXAM: CT HEAD WITHOUT CONTRAST CT CERVICAL SPINE WITHOUT CONTRAST TECHNIQUE: Multidetector  CT imaging of the head and cervical spine was performed following the standard protocol without intravenous contrast. Multiplanar CT image reconstructions of the cervical spine were also generated. COMPARISON:  None. FINDINGS: CT HEAD FINDINGS Brain: Large mass lesion in the right posterior frontal lobe measuring approximately 4.1 x 4.6 cm. Large amount of surrounding vasogenic edema in the white matter. Local mass-effect on the right lateral ventricle. 6 mm midline shift to the left. The mass is approximately isodense to brain. No definite hemorrhage. No second lesion identified. Ventricle size not enlarged. Vascular: Negative for hyperdense vessel Skull: Negative Sinuses/Orbits: Mucosal edema paranasal sinuses.  Negative orbit Other: None CT CERVICAL SPINE FINDINGS Alignment: Normal Skull base and vertebrae: Negative for fracture or mass lesion. Soft tissues and spinal canal: Negative for soft tissue mass Disc levels: Disc degeneration and spurring at C5-6 with foraminal and spinal stenosis due to spurring. Upper chest: Lung apices clear bilaterally. Other: None IMPRESSION: 1. Large mass lesion in the right posterior frontal lobe with extensive surrounding edema. There is mass-effect and 6 mm midline shift. No hemorrhage. 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 Recommend MRI brain without with contrast 2. Negative for cervical fracture. Cervical spondylosis most notably at C5-6. 3. These results were called by telephone at the time of  interpretation on 08/17/2020 at 11:29 am to provider MARK QUALE , who verbally acknowledged these results. Electronically Signed   By: Franchot Gallo M.D.   On: 08/17/2020 11:31   CT Cervical Spine Wo Contrast  Result Date: 08/17/2020 CLINICAL DATA:  Recent falls. EXAM: CT HEAD WITHOUT CONTRAST CT CERVICAL SPINE WITHOUT CONTRAST TECHNIQUE: Multidetector CT imaging of the head and cervical spine was performed following the standard protocol without intravenous contrast. Multiplanar CT image reconstructions of the cervical spine were also generated. COMPARISON:  None. FINDINGS: CT HEAD FINDINGS Brain: Large mass lesion in the right posterior frontal lobe measuring approximately 4.1 x 4.6 cm. Large amount of surrounding vasogenic edema in the white matter. Local mass-effect on the right lateral ventricle. 6 mm midline shift to the left. The mass is approximately isodense to brain. No definite hemorrhage. No second lesion identified. Ventricle size not enlarged. Vascular: Negative for hyperdense vessel Skull: Negative Sinuses/Orbits: Mucosal edema paranasal sinuses.  Negative orbit Other: None CT CERVICAL SPINE FINDINGS Alignment: Normal Skull base and vertebrae: Negative for fracture or mass lesion. Soft tissues and spinal canal: Negative for soft tissue mass Disc levels: Disc degeneration and spurring at C5-6 with foraminal and spinal stenosis due to spurring. Upper chest: Lung apices clear bilaterally. Other: None IMPRESSION: 1. Large mass lesion in the right posterior frontal lobe with extensive surrounding edema. There is mass-effect and 6 mm midline shift. No hemorrhage. 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 Recommend MRI brain without with contrast 2. Negative for cervical fracture. Cervical spondylosis most notably at C5-6. 3. These results were called by telephone at the time of interpretation on 08/17/2020 at 11:29 am to provider MARK QUALE , who verbally  acknowledged these results. Electronically Signed   By: Franchot Gallo M.D.   On: 08/17/2020 11:31   MR Brain W and Wo Contrast  Result Date: 08/17/2020 CLINICAL DATA:  Brain mass or lesion. EXAM: MRI HEAD WITHOUT AND WITH CONTRAST TECHNIQUE: Multiplanar, multiecho pulse sequences of the brain and surrounding structures were obtained without and with intravenous contrast. CONTRAST:  47mL GADAVIST GADOBUTROL 1 MMOL/ML IV SOLN COMPARISON:  Head CT August 17, 2020. FINDINGS: Brain:  Large heterogeneous mass lesion centered in the deep right frontoparietal region extending into the right basal ganglia region. The lesion is multilobulated with central necrosis and peripheral irregular contrast enhancement and measures approximately 4 x 3.8 x 3.6 cm (AP, T, cc). Foci of restricted diffusion within the tumor suggests hypercellularity. There is prominent surrounding T2 hyperintensity consistent with vasogenic edema with possible tumor extension. T2 hyperintensity extending inferiorly along the cortical spinal tract in the posterior limb of internal capsule and right cerebral peduncle. There is mass effect with effacement of the adjacent cerebral sulci, partial effacement of the left lateral ventricle and a 7 mm leftward midline shift. No acute infarct, hydrocephalus or extra-axial collection. Vascular: Normal flow voids. Skull and upper cervical spine: Normal marrow signal. Sinuses/Orbits: Prominence of the optic nerve sheath with flattening of the optic discs suggestive of increased intracranial pressure. Mild mucosal thickening of the bilateral maxillary sinuses. Other: Mild bilateral mastoid effusion. IMPRESSION: 1. Large heterogeneous mass lesion centered in the deep right frontoparietal region extending into the right basal ganglia region, measuring approximately 4 x 3.8 x 3.6 cm (AP, T, cc). Finding are concerning for primary high-grade neoplasm versus metastasis. 2. Prominent surrounding vasogenic edema with possible  tumor extension causing effacement of the adjacent cerebral sulci, partial effacement of the left lateral ventricle and a 7 mm leftward midline shift. 3. Prominence of the optic nerve sheath with flattening of the optic discs suggestive of increased intracranial pressure. Electronically Signed   By: Pedro Earls M.D.   On: 08/17/2020 13:03   VAS [place] LOWER EXTREMITY VENOUS (DVT)  Result Date: 08/28/2020  Lower Venous DVT Study Indications: Edema.  Risk Factors: None identified. Limitations: Body habitus and poor ultrasound/tissue interface. Comparison Study: No prior studies. Performing Technologist: Oliver Hum RVT  Examination Guidelines: A complete evaluation includes B-mode imaging, spectral Doppler, color Doppler, and power Doppler as needed of all accessible portions of each vessel. Bilateral testing is considered an integral part of a complete examination. Limited examinations for reoccurring indications may be performed as noted. The reflux portion of the exam is performed with the patient in reverse Trendelenburg.  +---------+---------------+---------+-----------+----------+--------------+ RIGHT    CompressibilityPhasicitySpontaneityPropertiesThrombus Aging +---------+---------------+---------+-----------+----------+--------------+ CFV      Full           Yes      Yes                                 +---------+---------------+---------+-----------+----------+--------------+ SFJ      Full                                                        +---------+---------------+---------+-----------+----------+--------------+ FV Prox  Full                                                        +---------+---------------+---------+-----------+----------+--------------+ FV Mid   Full                                                        +---------+---------------+---------+-----------+----------+--------------+  FV DistalFull                                                         +---------+---------------+---------+-----------+----------+--------------+ PFV      Full                                                        +---------+---------------+---------+-----------+----------+--------------+ POP      Full           Yes      Yes                                 +---------+---------------+---------+-----------+----------+--------------+ PTV      Full                                                        +---------+---------------+---------+-----------+----------+--------------+ PERO     Full                                                        +---------+---------------+---------+-----------+----------+--------------+   +---------+---------------+---------+-----------+----------+--------------+ LEFT     CompressibilityPhasicitySpontaneityPropertiesThrombus Aging +---------+---------------+---------+-----------+----------+--------------+ CFV      Full           Yes      Yes                                 +---------+---------------+---------+-----------+----------+--------------+ SFJ      Full                                                        +---------+---------------+---------+-----------+----------+--------------+ FV Prox  Full                                                        +---------+---------------+---------+-----------+----------+--------------+ FV Mid   Full                                                        +---------+---------------+---------+-----------+----------+--------------+ FV DistalFull                                                        +---------+---------------+---------+-----------+----------+--------------+  PFV      Full                                                        +---------+---------------+---------+-----------+----------+--------------+ POP      Partial        No       No                   Acute           +---------+---------------+---------+-----------+----------+--------------+ PTV      Full                                                        +---------+---------------+---------+-----------+----------+--------------+ PERO     Partial                                      Acute          +---------+---------------+---------+-----------+----------+--------------+ Gastroc  Full                                                        +---------+---------------+---------+-----------+----------+--------------+     Summary: RIGHT: - There is no evidence of deep vein thrombosis in the lower extremity.  - No cystic structure found in the popliteal fossa.  LEFT: - Findings consistent with acute deep vein thrombosis involving the left popliteal vein, and left peroneal veins. - No cystic structure found in the popliteal fossa.  *See table(s) above for measurements and observations.    Preliminary     Labs:  CBC: Recent Labs    08/17/20 1046 08/28/20 0454  WBC 19.1* 31.0*  HGB 15.2 8.7*  HCT 45.0 24.5*  PLT 408* 370    COAGS: Recent Labs    08/17/20 1047  INR 0.9  APTT 26    BMP: Recent Labs    08/17/20 1046 08/28/20 0454  NA 141 134*  K 3.7 4.3  CL 106 100  CO2 25 27  GLUCOSE 111* 118*  BUN 12 19  CALCIUM 9.1 8.2*  CREATININE 0.91 0.73  GFRNONAA >60 >60    LIVER FUNCTION TESTS: Recent Labs    08/17/20 1046 08/28/20 0454  BILITOT 1.0 0.7  AST 19 20  ALT 17 27  ALKPHOS 77 34*  PROT 7.7 5.0*  ALBUMIN 4.2 2.6*    TUMOR MARKERS: No results for input(s): AFPTM, CEA, CA199, CHROMGRNA in the last 8760 hours.  Assessment and Plan:  High-grade glioma status post craniotomy.  Now with left leg DVT and unable to anticoagulate secondary to recent surgery and anemia.  We will proceed with venogram and placement of IVC filter as IR schedule allows.  Risks and benefits discussed with the patient including, but not limited to bleeding, infection, contrast  induced renal failure, filter fracture or migration which can lead to emergency surgery or even death, strut penetration with damage or irritation to adjacent structures and caval  thrombosis.  All of the patient's questions were answered, patient is agreeable to proceed. Consent signed and in chart.  Thank you for this interesting consult.  I greatly enjoyed meeting NGUYEN TODOROV and look forward to participating in their care.  A copy of this report was sent to the requesting provider on this date.  Electronically Signed: Murrell Redden, PA-C   08/28/2020, 2:03 PM      I spent a total of 20 Minutes in face to face in clinical consultation, greater than 50% of which was counseling/coordinating care for insertion of inferior vena cava filter.

## 2020-08-29 ENCOUNTER — Inpatient Hospital Stay (HOSPITAL_COMMUNITY): Payer: Commercial Managed Care - PPO

## 2020-08-29 DIAGNOSIS — C719 Malignant neoplasm of brain, unspecified: Secondary | ICD-10-CM | POA: Diagnosis not present

## 2020-08-29 DIAGNOSIS — G8194 Hemiplegia, unspecified affecting left nondominant side: Secondary | ICD-10-CM | POA: Diagnosis not present

## 2020-08-29 DIAGNOSIS — G441 Vascular headache, not elsewhere classified: Secondary | ICD-10-CM | POA: Diagnosis not present

## 2020-08-29 DIAGNOSIS — I82432 Acute embolism and thrombosis of left popliteal vein: Secondary | ICD-10-CM | POA: Diagnosis not present

## 2020-08-29 HISTORY — PX: IR IVC FILTER PLMT / S&I /IMG GUID/MOD SED: IMG701

## 2020-08-29 LAB — GLUCOSE, CAPILLARY
Glucose-Capillary: 109 mg/dL — ABNORMAL HIGH (ref 70–99)
Glucose-Capillary: 96 mg/dL (ref 70–99)
Glucose-Capillary: 108 mg/dL — ABNORMAL HIGH (ref 70–99)
Glucose-Capillary: 101 mg/dL — ABNORMAL HIGH (ref 70–99)

## 2020-08-29 MED ORDER — LIDOCAINE HCL 1 % IJ SOLN
INTRAMUSCULAR | Status: AC
  Filled 2020-08-29: qty 20

## 2020-08-29 MED ORDER — IOHEXOL 300 MG/ML  SOLN
50.0000 mL | Freq: Once | INTRAMUSCULAR | Status: AC | PRN
  Administered 2020-08-29: 20 mL via INTRAVENOUS

## 2020-08-29 MED ORDER — LIDOCAINE HCL 1 % IJ SOLN
INTRAMUSCULAR | Status: AC | PRN
  Administered 2020-08-29: 5 mL

## 2020-08-29 MED ORDER — MIDAZOLAM HCL 2 MG/2ML IJ SOLN
INTRAMUSCULAR | Status: AC | PRN
  Administered 2020-08-29: 1 mg via INTRAVENOUS

## 2020-08-29 MED ORDER — FENTANYL CITRATE (PF) 100 MCG/2ML IJ SOLN
INTRAMUSCULAR | Status: AC
  Filled 2020-08-29: qty 2

## 2020-08-29 MED ORDER — MELATONIN 3 MG PO TABS
3.0000 mg | ORAL_TABLET | Freq: Every day | ORAL | Status: DC
  Administered 2020-08-29 – 2020-09-15 (×18): 3 mg via ORAL
  Filled 2020-08-29 (×18): qty 1

## 2020-08-29 MED ORDER — FENTANYL CITRATE (PF) 100 MCG/2ML IJ SOLN
INTRAMUSCULAR | Status: AC | PRN
Start: 2020-08-29 — End: 2020-08-29
  Administered 2020-08-29: 50 ug via INTRAVENOUS

## 2020-08-29 MED ORDER — MIDAZOLAM HCL 2 MG/2ML IJ SOLN
INTRAMUSCULAR | Status: AC
  Filled 2020-08-29: qty 2

## 2020-08-29 MED ORDER — IOHEXOL 300 MG/ML  SOLN
50.0000 mL | Freq: Once | INTRAMUSCULAR | Status: AC | PRN
  Administered 2020-08-29: 15 mL via INTRAVENOUS

## 2020-08-29 MED ORDER — TRAZODONE HCL 50 MG PO TABS
100.0000 mg | ORAL_TABLET | Freq: Every day | ORAL | Status: DC
  Administered 2020-08-29 – 2020-09-01 (×4): 100 mg via ORAL
  Filled 2020-08-29 (×4): qty 2

## 2020-08-29 NOTE — Progress Notes (Addendum)
PROGRESS NOTE   Subjective/Complaints: Still not sleeping well. Just feels restless. No problems breathing. Pain controlled. Headaches perhaps a little better  ROS: Patient denies fever, rash, sore throat, blurred vision, nausea, vomiting, diarrhea, cough, shortness of breath or chest pain, joint or back pain    Objective:   VAS [place] LOWER EXTREMITY VENOUS (DVT)  Result Date: 08/28/2020  Lower Venous DVT Study Indications: Edema.  Risk Factors: None identified. Limitations: Body habitus and poor ultrasound/tissue interface. Comparison Study: No prior studies. Performing Technologist: Oliver Hum RVT  Examination Guidelines: A complete evaluation includes B-mode imaging, spectral Doppler, color Doppler, and power Doppler as needed of all accessible portions of each vessel. Bilateral testing is considered an integral part of a complete examination. Limited examinations for reoccurring indications may be performed as noted. The reflux portion of the exam is performed with the patient in reverse Trendelenburg.  +---------+---------------+---------+-----------+----------+--------------+ RIGHT    CompressibilityPhasicitySpontaneityPropertiesThrombus Aging +---------+---------------+---------+-----------+----------+--------------+ CFV      Full           Yes      Yes                                 +---------+---------------+---------+-----------+----------+--------------+ SFJ      Full                                                        +---------+---------------+---------+-----------+----------+--------------+ FV Prox  Full                                                        +---------+---------------+---------+-----------+----------+--------------+ FV Mid   Full                                                        +---------+---------------+---------+-----------+----------+--------------+ FV DistalFull                                                         +---------+---------------+---------+-----------+----------+--------------+ PFV      Full                                                        +---------+---------------+---------+-----------+----------+--------------+ POP      Full           Yes  Yes                                 +---------+---------------+---------+-----------+----------+--------------+ PTV      Full                                                        +---------+---------------+---------+-----------+----------+--------------+ PERO     Full                                                        +---------+---------------+---------+-----------+----------+--------------+   +---------+---------------+---------+-----------+----------+--------------+ LEFT     CompressibilityPhasicitySpontaneityPropertiesThrombus Aging +---------+---------------+---------+-----------+----------+--------------+ CFV      Full           Yes      Yes                                 +---------+---------------+---------+-----------+----------+--------------+ SFJ      Full                                                        +---------+---------------+---------+-----------+----------+--------------+ FV Prox  Full                                                        +---------+---------------+---------+-----------+----------+--------------+ FV Mid   Full                                                        +---------+---------------+---------+-----------+----------+--------------+ FV DistalFull                                                        +---------+---------------+---------+-----------+----------+--------------+ PFV      Full                                                        +---------+---------------+---------+-----------+----------+--------------+ POP      Partial        No       No                   Acute           +---------+---------------+---------+-----------+----------+--------------+ PTV      Full                                                        +---------+---------------+---------+-----------+----------+--------------+  PERO     Partial                                      Acute          +---------+---------------+---------+-----------+----------+--------------+ Gastroc  Full                                                        +---------+---------------+---------+-----------+----------+--------------+     Summary: RIGHT: - There is no evidence of deep vein thrombosis in the lower extremity.  - No cystic structure found in the popliteal fossa.  LEFT: - Findings consistent with acute deep vein thrombosis involving the left popliteal vein, and left peroneal veins. - No cystic structure found in the popliteal fossa.  *See table(s) above for measurements and observations.    Preliminary    Recent Labs    08/28/20 0454  WBC 31.0*  HGB 8.7*  HCT 24.5*  PLT 370   Recent Labs    08/28/20 0454  NA 134*  K 4.3  CL 100  CO2 27  GLUCOSE 118*  BUN 19  CREATININE 0.73  CALCIUM 8.2*    Intake/Output Summary (Last 24 hours) at 08/29/2020 1006 Last data filed at 08/29/2020 0709 Gross per 24 hour  Intake 600 ml  Output 500 ml  Net 100 ml        Physical Exam: Vital Signs Blood pressure 122/69, pulse (!) 59, temperature 98.2 F (36.8 C), resp. rate 18, height 6\' 3"  (1.905 m), weight (!) 147.5 kg, SpO2 96 %.  Constitutional: No distress . Vital signs reviewed. HEENT: EOMI, oral membranes moist Neck: supple Cardiovascular: RRR without murmur. No JVD    Respiratory/Chest: CTA Bilaterally without wheezes or rales. Normal effort    GI/Abdomen: BS +, non-tender, non-distended Ext: no clubbing, cyanosis, or edema Psych: flat, cooperative Skin: scalp wound CDI with staples Neuro: Pt is cognitively appropriate with reasonable insight, memory, and awareness. Cranial  nerves 2-12 are intact. Sensory exam is normal. Reflexes are 2+ on left. Mild flexor tone LUE is ongoing. Fine motor coordination is intact. No tremors. Motor function is grossly 5/5 RUE and RLE and 1+ LUE and LLE prox to trace/absent distally.   Musculoskeletal: Full ROM, No pain with AROM or PROM in the neck, trunk, or extremities. Posture appropriate. Edema LUE trace as well as tr to 1+ pedal edema. Left heel cord remains tight.        Assessment/Plan: 1. Functional deficits which require 3+ hours per day of interdisciplinary therapy in a comprehensive inpatient rehab setting.  Physiatrist is providing close team supervision and 24 hour management of active medical problems listed below.  Physiatrist and rehab team continue to assess barriers to discharge/monitor patient progress toward functional and medical goals  Care Tool:  Bathing    Body parts bathed by patient: Left arm,Chest,Abdomen,Front perineal area,Face   Body parts bathed by helper: Right arm,Buttocks,Right upper leg,Left upper leg,Right lower leg,Left lower leg     Bathing assist Assist Level: 2 Helpers (to roll)     Upper Body Dressing/Undressing Upper body dressing   What is the patient wearing?: Pull over shirt    Upper body assist Assist Level: Moderate Assistance - Patient 50 - 74%    Lower  Body Dressing/Undressing Lower body dressing      What is the patient wearing?: Underwear/pull up     Lower body assist Assist for lower body dressing: 2 Helpers     Toileting Toileting    Toileting assist Assist for toileting: 2 Helpers     Transfers Chair/bed transfer  Transfers assist  Chair/bed transfer activity did not occur: Safety/medical concerns        Locomotion Ambulation   Ambulation assist   Ambulation activity did not occur: Safety/medical concerns          Walk 10 feet activity   Assist  Walk 10 feet activity did not occur: Safety/medical concerns        Walk 50 feet  activity   Assist Walk 50 feet with 2 turns activity did not occur: Safety/medical concerns         Walk 150 feet activity   Assist Walk 150 feet activity did not occur: Safety/medical concerns         Walk 10 feet on uneven surface  activity   Assist Walk 10 feet on uneven surfaces activity did not occur: Safety/medical concerns         Wheelchair     Assist Will patient use wheelchair at discharge?: Yes   Wheelchair activity did not occur: Safety/medical concerns         Wheelchair 50 feet with 2 turns activity    Assist    Wheelchair 50 feet with 2 turns activity did not occur: Safety/medical concerns       Wheelchair 150 feet activity     Assist  Wheelchair 150 feet activity did not occur: Safety/medical concerns       Blood pressure 122/69, pulse (!) 59, temperature 98.2 F (36.8 C), resp. rate 18, height 6\' 3"  (1.905 m), weight (!) 147.5 kg, SpO2 96 %. Medical Problem List and Plan: 1.  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 Asked to have family bring                         -consvt mgt of tone for now   2.  Antithrombotics: -DVT/anticoagulation:  Pharmaceutical: Lovenox  -Dopplers 3/19 revealed thrombus in the left popliteal and peroneal veins.   -IR to place IVCF today. Appreciate their help!  -bed rest until after procedure per protocol             -antiplatelet therapy: N/A 3. Pain Management: Oxycodone prn. Will continue tylenol qid.  -continue HS topamax for headaches and to assist with sleep  4. Mood/sleep:               -antipsychotic agents: N/A  -trazodone ineffective at 50mg . Will try 100mg  and add melatonin 5. Neuropsych: This patient is  capable of making decisions on her own behalf. 6. Skin/Wound Care: Routine pressure relief measures.  7. Fluids/Electrolytes/Nutrition:  encourage PO  -protein supp for low albumin 8. Hypotension: Monitor BP tid--off Cozaar at this time.  9. ABLA due to UGIB s/p hemo clip: hgb 8.7 today 3/19 10 Leukocytosis:  WBC 19.0 at admission. Up to 36k now 31k 3/19  -likely related to steroids and DVT. No fever, s/s infection currently  -recheck next week 11. Cerebral edema: Continues to have stable right to left 9 mm shift             --Steroid taper per NS recs 12. 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  13. Constipation: Resolved with augmentation of medications.             --reports good results after suppository yesterday.    LOS: 2 days A FACE TO FACE EVALUATION WAS PERFORMED  Meredith Staggers 08/29/2020, 10:06 AM

## 2020-08-29 NOTE — IPOC Note (Signed)
Overall Plan of Care [facility]) Patient Details Name: Todd Villa MRN: [id number] DOB: 06-24-1970  Admitting Diagnosis: Glioblastoma multiforme of brain [facility])  Hospital Problems: Principal Problem:   Glioblastoma multiforme of brain ([place]) Active Problems:   Acute deep vein thrombosis (DVT) of popliteal vein of left lower extremity (HCC)   Vascular headache   Sleep disorder   Left hemiparesis ([address])     Functional Problem List: Nursing Bladder,Bowel,Perception,Safety,Endurance,Medication Management  PT Balance,Behavior,Edema,Endurance,Motor,Perception,Safety,Sensory,Skin Integrity  OT Balance,Behavior,Cognition,Edema,Endurance,Motor,Nutrition,Perception,Safety,Sensory,Skin Integrity,Vision  SLP Cognition,Safety  TR         Basic ADL's: OT Eating,Grooming,Bathing,Dressing,Toileting     Advanced  ADL's: OT       Transfers: PT Bed Mobility,Bed to [place]  OT Toilet,Tub/Shower     Locomotion: PT Ambulation,Stairs,Wheelchair Mobility     Additional Impairments: OT Fuctional Use of Upper Extremity  SLP        TR      Anticipated Outcomes Item Anticipated Outcome  Self Feeding S  Swallowing      Basic self-care  S shirt; MIN A LB  Toileting  MIN A   Bathroom Transfers MIN A  Bowel/Bladder  reduce episodes of functional incontinence  Transfers  Min assist with LRAD  Locomotion  Supervision assist WC mobility. min assist gait for short distances and LRAD  Communication     Cognition  Supervision  Pain  no pain or less than 2  Safety/Judgment  no falls, skin breakdown, infection while on rehab   Therapy Plan: PT Intensity: Minimum of 1-2 x/day ,45 to 90 minutes PT Frequency: 5 out of 7 days PT Duration Estimated Length of Stay: 3-4 weeks OT Intensity: Minimum of 1-2 x/day, 45 to 90 minutes OT Frequency: 5 out of 7 days OT Duration/Estimated Length of Stay: 3.5-4 weeks SLP Intensity: Minumum of 1-2 x/day, 30 to 90 minutes SLP  Frequency: 3 to 5 out of 7 days SLP Duration/Estimated Length of Stay: 2 to 3 weeks   Due to the current state of emergency, patients may not be receiving their 3-hours of Medicare-mandated therapy.   Team Interventions: Nursing Interventions Patient/Family Education,Bowel Management,Bladder Management,Medication Management,Disease Management/Prevention,Discharge Planning,Psychosocial Support  PT interventions Ambulation/gait training,Balance/vestibular training,Cognitive remediation/compensation,Community reintegration,Discharge planning,Disease Charity fundraiser re-education,Functional mobility training,Functional electrical stimulation,Pain management,Patient/family education,Skin care/wound management,Psychosocial support,Splinting/orthotics,Stair training,Therapeutic Activities,Therapeutic Exercise,UE/LE Coordination activities,Visual/perceptual remediation/compensation,Wheelchair propulsion/positioning,UE/LE Strength taining/ROM  OT Interventions Balance/vestibular training,Discharge planning,Functional electrical stimulation,Pain management,Self Care/advanced ADL retraining,Therapeutic Activities,UE/LE Coordination activities,Visual/perceptual remediation/compensation,Therapeutic Exercise,Skin care/wound managment,Patient/family education,Functional mobility training,Disease mangement/prevention,Cognitive remediation/compensation,Community Corporate treasurer re-education,Psychosocial support,Splinting/orthotics,UE/LE Strength taining/ROM,Wheelchair propulsion/positioning  SLP Interventions Cognitive remediation/compensation,Functional tasks,Cueing hierarchy,Therapeutic Activities,Therapeutic Exercise,Medication managment,Patient/family education  TR Interventions    SW/CM Interventions Discharge Planning,Psychosocial Support,Patient/Family Education   Barriers to Discharge MD  Medical stability   Nursing      PT Inaccessible home environment,Home environment access/layout,Insurance for SNF coverage,Weight,Pending surgery    OT Weight,Pending chemo/radiation,Incontinence    SLP      SW Decreased caregiver support,Lack of/limited family support     Team Discharge Planning: Destination: PT-Home ,OT- Home , SLP-Home Projected Follow-up: PT-Home health PT, OT-  Home health OT, SLP-Outpatient SLP Projected Equipment Needs: PT-Wheelchair cushion (measurements),Wheelchair (measurements),To be determined, OT- Tub/shower bench,To be determined, SLP-To be determined Equipment Details: PT- , OT-  Patient/family involved in discharge planning: PT- Patient,Family member/caregiver,  OT-Patient, SLP-Patient  MD ELOS: 3-4 weeks Medical Rehab Prognosis:  Excellent Assessment: The patient has been admitted for CIR therapies with the diagnosis of GBM s/p resection, course complicated by LLE DVT. The team will be addressing functional mobility, strength,  stamina, balance, safety, adaptive techniques and equipment, self-care, bowel and bladder mgt, patient and caregiver education, NMR, visual-spatial awareness, pain control, sleep-wake restoration. Goals have been set at supervision to min assist with mobility and self-care and supervision with cognition.   Due to the current state of emergency, patients may not be receiving their 3 hours per day of Medicare-mandated therapy.    Meredith Staggers, MD, FAAPMR      See Team Conference Notes for weekly updates to the plan of care

## 2020-08-29 NOTE — Procedures (Signed)
  Procedure: IVCgram and filter placement   °EBL:   minimal °Complications:  none immediate ° °See full dictation in [facility] PACS. ° °D. Leyla Soliz MD °Main # [phone] °Pager  [phone] °Mobile [phone] °  ° °

## 2020-08-29 NOTE — Progress Notes (Signed)
Pt given 50mg  of ordered trazodone but states he did not sleep all night. Would like to discuss the possibility of using a condom cath at night to help reduce the number of times he is awakened to use the urinal. Call bell at bedside.

## 2020-08-29 NOTE — Progress Notes (Signed)
Physical Therapy Session Note  Patient Details  Name: Todd Villa MRN: [id number] Date of Birth: [birth year]  Today's Date: 08/29/2020 PT Individual Time: 8469-6295 PT Individual Time Calculation (min): 24 min   Short Term Goals: Week 1:  PT Short Term Goal 1 (Week 1): Pt will perform bed mobility with max assist of 1. PT Short Term Goal 2 (Week 1): Pt will transfer to and from [facility] with max assist PT Short Term Goal 3 (Week 1): Pt will propel WC 63ft with min assist  Skilled Therapeutic Interventions/Progress Updates:    prior to entering pt's room PT discussed  with pt's nurse of pt's active bedrest orders and need of IVC filter placement per chart review. Per pt's nurse, bed rest orders do continue to be in effect but only for LE and to remain in bed. PT verified that pt can mobilize BUE and nursing reported yes to his comfort. pt received in bed and agreeable to therapy in bed. Pt denied pain and aware of need of IVC filter and current DVT. Pt directed in RUE bicep curls, single arm chest press and RUE assisting with LUE for bicep curls and horizontal adduction lower than shoulder level 2x15 each. Pt educated on safety with current orders, pt agreeable. Pt verbalized being very motivated to participate with therapy and hopes to continue to work on all mobility once orders lifted. Pt then reported he feels that he has not slept well and is very fatigued with UE activity. Pt requested to attempt to sleep if he was unable to sit up. PT assisted with repositioning pt's trunk in bed and LUE on pillows for improved placement, max A for this. Pt left in bed, All needs in reach and in good condition. Call light in hand.  And alarm set. Nursing aware of pt's fatigue and difficulty sleeping.  Pt missed 36 mins of PT 2/2 medical orders for bed rest and being medically inappropriate for continued PT during session at this time. Will continue to follow.    Therapy Documentation Precautions:   Precautions Precautions: None Restrictions Weight Bearing Restrictions: No RLE Weight Bearing: Non weight bearing LLE Weight Bearing: Non weight bearing General: PT Amount of Missed Time (min): 36 Minutes PT Missed Treatment Reason: MD hold (Comment);Patient fatigue Vital Signs:  Pain: Pain Assessment Pain Scale: 0-10 Pain Score: 3  Pain Type: Surgical pain Pain Location: Head Pain Orientation: Right Pain Descriptors / Indicators: Aching;Discomfort Pain Frequency: Intermittent Pain Intervention(s): Medication (See eMAR) Mobility:   Locomotion :    Trunk/Postural Assessment :    Balance:   Exercises:   Other Treatments:      Therapy/Group: Individual Therapy  Junie Panning 08/29/2020, 9:08 AM

## 2020-08-30 ENCOUNTER — Inpatient Hospital Stay (HOSPITAL_COMMUNITY): Payer: Commercial Managed Care - PPO

## 2020-08-30 DIAGNOSIS — C719 Malignant neoplasm of brain, unspecified: Secondary | ICD-10-CM | POA: Diagnosis not present

## 2020-08-30 LAB — BASIC METABOLIC PANEL
Anion gap: 7 (ref 5–15)
BUN: 21 mg/dL — ABNORMAL HIGH (ref 6–20)
CO2: 26 mmol/L (ref 22–32)
Calcium: 8.5 mg/dL — ABNORMAL LOW (ref 8.9–10.3)
Chloride: 100 mmol/L (ref 98–111)
Creatinine, Ser: 0.77 mg/dL (ref 0.61–1.24)
GFR, Estimated: >60 mL/min (ref >60)
Glucose, Bld: 111 mg/dL — ABNORMAL HIGH (ref 70–99)
Potassium: 3.8 mmol/L (ref 3.5–5.1)
Sodium: 133 mmol/L — ABNORMAL LOW (ref 135–145)

## 2020-08-30 LAB — GLUCOSE, CAPILLARY
Glucose-Capillary: 107 mg/dL — ABNORMAL HIGH (ref 70–99)
Glucose-Capillary: 95 mg/dL (ref 70–99)
Glucose-Capillary: 99 mg/dL (ref 70–99)
Glucose-Capillary: 130 mg/dL — ABNORMAL HIGH (ref 70–99)

## 2020-08-30 MED ORDER — DEXAMETHASONE 4 MG PO TABS
4.0000 mg | ORAL_TABLET | Freq: Two times a day (BID) | ORAL | Status: DC
  Administered 2020-08-30 – 2020-09-01 (×4): 4 mg via ORAL
  Filled 2020-08-30 (×4): qty 1

## 2020-08-30 MED ORDER — TOPIRAMATE 25 MG PO TABS
50.0000 mg | ORAL_TABLET | Freq: Every day | ORAL | Status: DC
  Administered 2020-08-30 – 2020-09-15 (×17): 50 mg via ORAL
  Filled 2020-08-30 (×17): qty 2

## 2020-08-30 NOTE — Progress Notes (Signed)
Speech Language Pathology Daily Session Note  Patient Details  Name: Todd Villa MRN: [id number] Date of Birth: 23-Dec-1970  Today's Date: 08/30/2020 SLP Individual Time: 5093-2671 SLP Individual Time Calculation (min): 41 min  Short Term Goals: Week 1: SLP Short Term Goal 1 (Week 1): Patient will demonstrate recall for new, daily information with supervision level verbal cues. SLP Short Term Goal 2 (Week 1): Patient will demonstrate selective attention in a mildly distracting enviornment for ~10 minutes with min A verbal cues. SLP Short Term Goal 3 (Week 1): Patient will demonstrate functional problem solving for mildly complex tasks with Supervision level cues. SLP Short Term Goal 4 (Week 1): Pt will increase left field scanning to increase safety during functional activities (mobility, reading, writing, etc) provided mod A verbal and visual cues SLP Short Term Goal 5 (Week 1): Pt will understand, recall and utilize compensatory strategies to increase speech intelligiblity with Supervision level cues.  Skilled Therapeutic Interventions: Pt was seen for skilled ST targeting cognitive goals. Upon arrival, pt was appropriate but slightly confused and perseverative on asking about method of payment for his hospital stay and "how this worksEngineer, site. SLP provided explanation of therapies, Minimal assistance discriminating between therapy disciplines and examples of activities targeted in each one. Min A verbal and visual cues also provided for problem solving how to unlock his phone and use aids for orientation to date. He verbalized good awareness that he would be unable to stand on his own given current physical limitations, but Min-Mod A required for recall and carryover of use of call bell to request assistance. Pt became labile at end of session when his wife arrived. Emotional support and encouragement provided. Pt left laying in bed with alarm set and needs within  reach. Continue per current plan of care.         Pain Pain Assessment Pain Scale: Faces Faces Pain Scale: Hurts little more Pain Type: Surgical pain Pain Location: Head Pain Descriptors / Indicators: Headache Pain Onset: On-going Pain Intervention(s): Emotional support;Other (Comment) (pt had already had pain medicine interventions available) Multiple Pain Sites: No  Therapy/Group: Individual Therapy  Arbutus Leas 08/30/2020, 7:15 AM

## 2020-08-30 NOTE — Progress Notes (Addendum)
Late note regarding events last pm:  Floaters right side of the room. Anxiety--calling wife concerns about bills/RTW. Flat affect with visual hallucinations due to right visual field deficits. Kept looking to the right side of the room. HA "bad" to wife but rates pain as "4". Wife also concerned about his levels of anxiety as he continues to ask her about returning to work and expresses concerns about bills despite her attempts to allay his fears.  She would like an anxiolytic for use prn. Did relay to her that steroids had been tapered and could cause some increase in symptoms especially with the amount of post op cerebral edema.  Discussed with Dr. Adam Phenix -->stopped steriod taper and increased decadron back to 4 mg bid. CT head ordered for follow up. Wife is a Marine scientist at [employer] clinic and has interaction with Dr. Lacinda Axon. She will contact him with update -->current plans are for chemo v/s XRT after discharge.

## 2020-08-30 NOTE — Progress Notes (Signed)
PROGRESS NOTE   Subjective/Complaints: Has headaches this morning and is seeing floaters. Request pain medication for him. No other complaints Incision healing  ROS: Patient denies fever, rash, sore throat, blurred vision, nausea, vomiting, diarrhea, cough, shortness of breath or chest pain, joint or back pain    Objective:   IR IVC FILTER PLMT / S&I Burke Keels GUID/MOD SED  Result Date: 08/29/2020 CLINICAL DATA:  DVT. Recent intracranial surgery, a relative contraindication to anticoagulation. Caval filtration requested. EXAM: INFERIOR VENACAVOGRAM IVC FILTER PLACEMENT UNDER FLUOROSCOPY FLUOROSCOPY TIME:  1 minutes 24 seconds; 121 mGy TECHNIQUE: Patency of the right IJ vein was confirmed with ultrasound with image documentation. An appropriate skin site was determined. Skin site was marked, prepped with chlorhexidine, and draped using maximum barrier technique. The region was infiltrated locally with 1% lidocaine. Intravenous Fentanyl 17mcg and Versed 1mg  were administered as conscious sedation during continuous monitoring of the patient's level of consciousness and physiological / cardiorespiratory status by the radiology RN, with a total moderate sedation time of 42 minutes. Under real-time ultrasound guidance, the right IJ vein was accessed with a 21 gauge micropuncture needle; the needle tip within the vein was confirmed with ultrasound image documentation. The needle was exchanged over a 018 guidewire for a transitional dilator, which allow advancement of the [facility] wire into the IVC. A long 6 French vascular sheath was placed for inferior venacavography. This demonstrated no caval thrombus. Renal vein inflows were evident. The [facility] IVC filter was advanced through the sheath and successfully deployed under fluoroscopy at the L2 level. Followup cavagram demonstrates stable filter position and no evident complication. The sheath was removed and  hemostasis achieved at the site. No immediate complication. IMPRESSION: 1. Normal IVC. No thrombus or significant anatomic variation. 2. Technically successful infrarenal IVC filter placement. This is a retrievable model. PLAN: This IVC filter is potentially retrievable. The patient will be assessed for filter retrieval by Interventional Radiology in approximately 8-12 weeks. Further recommendations regarding filter retrieval, continued surveillance or declaration of device permanence, will be made at that time. Electronically Signed   By: Lucrezia Europe M.D.   On: 08/29/2020 13:59   Recent Labs    08/28/20 0454  WBC 31.0*  HGB 8.7*  HCT 24.5*  PLT 370   Recent Labs    08/28/20 0454 08/30/20 0624  NA 134* 133*  K 4.3 3.8  CL 100 100  CO2 27 26  GLUCOSE 118* 111*  BUN 19 21*  CREATININE 0.73 0.77  CALCIUM 8.2* 8.5*    Intake/Output Summary (Last 24 hours) at 08/30/2020 1217 Last data filed at 08/30/2020 0601 Gross per 24 hour  Intake 480 ml  Output 1500 ml  Net -1020 ml        Physical Exam: Vital Signs Blood pressure 121/71, pulse 62, temperature 98 F (36.7 C), resp. rate 18, height 6\' 3"  (1.905 m), weight (!) 147.5 kg, SpO2 97 %. Gen: no distress, normal appearing HEENT: oral mucosa pink and moist, NCAT Cardio: Reg rate Chest: normal effort, normal rate of breathing Abd: soft, non-distended Ext: no edema Psych: pleasant, normal affect  Skin: scalp wound CDI with staples Neuro: Pt is cognitively appropriate with reasonable  insight, memory, and awareness. Cranial nerves 2-12 are intact. Sensory exam is normal. Reflexes are 2+ on left. Mild flexor tone LUE is ongoing. Fine motor coordination is intact. No tremors. Motor function is grossly 5/5 RUE and RLE and 1+ LUE and LLE prox to trace/absent distally.   Musculoskeletal: Full ROM, No pain with AROM or PROM in the neck, trunk, or extremities. Posture appropriate. Edema LUE trace as well as tr to 1+ pedal edema. Left heel  cord remains tight.        Assessment/Plan: 1. Functional deficits which require 3+ hours per day of interdisciplinary therapy in a comprehensive inpatient rehab setting.  Physiatrist is providing close team supervision and 24 hour management of active medical problems listed below.  Physiatrist and rehab team continue to assess barriers to discharge/monitor patient progress toward functional and medical goals  Care Tool:  Bathing    Body parts bathed by patient: Left arm,Chest,Abdomen,Front perineal area,Face   Body parts bathed by helper: Right arm,Buttocks,Right upper leg,Left upper leg,Right lower leg,Left lower leg     Bathing assist Assist Level: Dependent - Patient 0%     Upper Body Dressing/Undressing Upper body dressing   What is the patient wearing?: Pull over shirt    Upper body assist Assist Level: Moderate Assistance - Patient 50 - 74%    Lower Body Dressing/Undressing Lower body dressing      What is the patient wearing?: Incontinence brief     Lower body assist Assist for lower body dressing: Dependent - Patient 0%     Toileting Toileting    Toileting assist Assist for toileting: Total Assistance - Patient < 25%     Transfers Chair/bed transfer  Transfers assist  Chair/bed transfer activity did not occur: Safety/medical concerns        Locomotion Ambulation   Ambulation assist   Ambulation activity did not occur: Safety/medical concerns          Walk 10 feet activity   Assist  Walk 10 feet activity did not occur: Safety/medical concerns        Walk 50 feet activity   Assist Walk 50 feet with 2 turns activity did not occur: Safety/medical concerns         Walk 150 feet activity   Assist Walk 150 feet activity did not occur: Safety/medical concerns         Walk 10 feet on uneven surface  activity   Assist Walk 10 feet on uneven surfaces activity did not occur: Safety/medical concerns          Wheelchair     Assist Will patient use wheelchair at discharge?: Yes   Wheelchair activity did not occur: Safety/medical concerns         Wheelchair 50 feet with 2 turns activity    Assist    Wheelchair 50 feet with 2 turns activity did not occur: Safety/medical concerns       Wheelchair 150 feet activity     Assist  Wheelchair 150 feet activity did not occur: Safety/medical concerns       Blood pressure 121/71, pulse 62, temperature 98 F (36.7 C), resp. rate 18, height 6\' 3"  (1.905 m), weight (!) 147.5 kg, SpO2 97 %. Medical Problem List and Plan: 1.  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 Previous right CVA              -patient may shower when he gets oob             -  ELOS/Goals: supervision to mod I with mobility and self-care, communication/cognition              -continue WHO at night, pt has PRAFO at home. Asked to have family bring                         -consvt mgt of tone for now 2.  Antithrombotics: -DVT/anticoagulation:  Pharmaceutical: Lovenox  -Dopplers 3/19 revealed thrombus in the left popliteal and peroneal veins. -IR has placed IVCF  -may resume therapies             -antiplatelet therapy: N/A 3. Pain Management: Oxycodone prn. Will continue tylenol qid.  -continue HS topamax for headaches and to assist with sleep   Increase topamax to 50mg .  4. Mood/sleep:               -antipsychotic agents: N/A  -trazodone ineffective at 50mg . Will try 100mg  and add melatonin 5. Neuropsych: This patient is capable of making decisions on her own behalf. 6. Skin/Wound Care: Routine pressure relief measures.  7. Fluids/Electrolytes/Nutrition:  encourage PO  -protein supp for low albumin 8. Hypotension: Monitor BP tid--off Cozaar at this time.  9. ABLA due to UGIB s/p hemo clip: hgb 8.7 today 3/19 10 Leukocytosis: WBC 19.0 at admission. Up to 36k now 31k 3/19  -likely related to steroids and  DVT. No fever, s/s infection currently  -recheck next week 11. Cerebral edema: Continues to have stable right to left 9 mm shift             --Steroid taper per NS recs 12. 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  13. Constipation: Resolved with augmentation of medications.             --reports good results after suppository yesterday.  14. Hyponatremia: Na 133, monitor weekly   LOS: 3 days A FACE TO FACE EVALUATION WAS PERFORMED  Clide Deutscher Raulkar 08/30/2020, 12:17 PM

## 2020-08-30 NOTE — Progress Notes (Signed)
   08/30/20 1020  Clinical Encounter Type  Visited With Patient  Visit Type Spiritual support  Referral From Nurse  Consult/Referral To Chaplain  Chaplain at bedside, and the patient yelling for help. The nurse stated he was ok and often yells. I was able to quiet the patient and told him I was there to pray for him. The patient was quiet during prayer. Two family members arrived at the end of my visit. This note was prepared by Jeanine Luz, M.Div..  For questions please contact by phone [phone].

## 2020-08-30 NOTE — Progress Notes (Signed)
Physical Therapy Session Note  Patient Details  Name: Todd Villa MRN: [id number] Date of Birth: 1971/03/30  Today's Date: 08/30/2020 PT Individual Time: 0806-0930 PT Individual Time Calculation (min): 84 min   Short Term Goals: Week 1:  PT Short Term Goal 1 (Week 1): Pt will perform bed mobility with max assist of 1. PT Short Term Goal 2 (Week 1): Pt will transfer to and from [facility] with max assist PT Short Term Goal 3 (Week 1): Pt will propel WC 30ft with min assist  Skilled Therapeutic Interventions/Progress Updates:     Pt received supine in bed and agrees to therapy. RN in room providing AM meds. Pt performs supine to sit toward L side of bed with minA. At EOB, pt practices lateral scooting with multimodal cues for body mechanics and sequencing. Squat pivot transfer to [facility] with modA going toward R. WC transport to gym for time management. Pt performs squat pivot to mat table with modA and cues for head hips relationship and sequencing. Pt able to maintain static sitting balance with supervision and cues for posture and hand placement for stability. Pt transitions from sitting to R side leaning on elbow back to sitting without physical assistance. Pt performs x3 reps of sit to stand from mat with maxA HHA and blocking of L knee, which notably buckles after several seconds. Pt noted to have L inattention and slight impulsivity. Pt transfers from mat>WC>recliner with maxA. Left seated in recliner with alarm intact and all needs within reach.  Therapy Documentation Precautions:  Precautions Precautions: None Restrictions Weight Bearing Restrictions: Yes LUE Weight Bearing: Non weight bearing RLE Weight Bearing: Weight bearing as tolerated LLE Weight Bearing: Non weight bearing   Therapy/Group: Individual Therapy  Breck Coons, PT, DPT 08/30/2020, 3:51 PM

## 2020-08-30 NOTE — Progress Notes (Signed)
Occupational Therapy Session Note  Patient Details  Name: Todd Villa MRN: [id number] Date of Birth: [birth year]  Today's Date: 08/30/2020 OT Individual Time: 0935-1030 OT Individual Time Calculation (min): 55 min    Short Term Goals: Week 1:  OT Short Term Goal 1 (Week 1): Pt will sit EOB wiht MIN A for dynamic sitting balnace OT Short Term Goal 2 (Week 1): Pt will transfer wiht MOD A to BSC and LRAD OT Short Term Goal 3 (Week 1): Pt will locate all grooming items on L of sink OT Short Term Goal 4 (Week 1): Pt will don shirt wiht S  Skilled Therapeutic Interventions/Progress Updates:   Pt received sitting in the recliner with no c/o pain but reporting he is seeing floaters , bilaterally with each eye occluded. Pt also reported some hallucinations occurring at night. Pt completed oral care at the sink with forced use of the LUE as gross stabilizer, max HOH required. Pt required min cueing for L attention but was able to attend to LUE this cueing only. Pt completed UB bathing with mod A overall. Cueing required to thread LUE into his shirt, mod A overall to dress. Attempted to stand from recliner with max A, but was unable with only +1 support. Stedy and +2 assist acquired and pt was able to stand for total A peri hygiene and clothing management. Total A required to support the LUE. Pt does have tone in the shoulder, especially in scapular adduction, as well as some reflexive movements. Pt was left sitting up in the recliner with chair alarm set, all needs met.   Therapy Documentation Precautions:  Precautions Precautions: None Restrictions Weight Bearing Restrictions: No RLE Weight Bearing: Non weight bearing LLE Weight Bearing: Non weight bearing   Therapy/Group: Individual Therapy  Curtis Sites 08/30/2020, 6:39 AM

## 2020-08-31 DIAGNOSIS — G8194 Hemiplegia, unspecified affecting left nondominant side: Secondary | ICD-10-CM | POA: Diagnosis not present

## 2020-08-31 DIAGNOSIS — C719 Malignant neoplasm of brain, unspecified: Secondary | ICD-10-CM | POA: Diagnosis not present

## 2020-08-31 DIAGNOSIS — G441 Vascular headache, not elsewhere classified: Secondary | ICD-10-CM | POA: Diagnosis not present

## 2020-08-31 DIAGNOSIS — I82432 Acute embolism and thrombosis of left popliteal vein: Secondary | ICD-10-CM | POA: Diagnosis not present

## 2020-08-31 LAB — GLUCOSE, CAPILLARY
Glucose-Capillary: 99 mg/dL (ref 70–99)
Glucose-Capillary: 136 mg/dL — ABNORMAL HIGH (ref 70–99)
Glucose-Capillary: 114 mg/dL — ABNORMAL HIGH (ref 70–99)
Glucose-Capillary: 113 mg/dL — ABNORMAL HIGH (ref 70–99)

## 2020-08-31 MED ORDER — ALPRAZOLAM 0.5 MG PO TABS
0.5000 mg | ORAL_TABLET | Freq: Two times a day (BID) | ORAL | Status: DC | PRN
  Administered 2020-08-31 – 2020-09-07 (×5): 0.5 mg via ORAL
  Filled 2020-08-31 (×3): qty 2
  Filled 2020-08-31: qty 1
  Filled 2020-08-31: qty 2

## 2020-08-31 NOTE — Progress Notes (Signed)
Occupational Therapy Session Note  Patient Details  Name: Todd Villa MRN: [id number] Date of Birth: 06-Jul-1970  Today's Date: 08/31/2020 OT Individual Time: 1348-1500 OT Individual Time Calculation (min): 72 min   Short Term Goals: Week 1:  OT Short Term Goal 1 (Week 1): Pt will sit EOB wiht MIN A for dynamic sitting balnace OT Short Term Goal 2 (Week 1): Pt will transfer wiht MOD A to BSC and LRAD OT Short Term Goal 3 (Week 1): Pt will locate all grooming items on L of sink OT Short Term Goal 4 (Week 1): Pt will don shirt wiht S  Skilled Therapeutic Interventions/Progress Updates:    Pt greeted seated in wc and agreeable to OT treatment session. Pt declined LB ADLs and thought he had to pee. OT reminded pt he has catheter in. Pt declined need to have a BM. Pt brought to the sink in wc and worked on L attention and use of L UE as a stabilizer. Pt perseverating on asking "when do you get off work?" and had to be re oriented to place and time multiple times. Worked on sit<>stand in [place] with max A to achieve stand. OT then had pt practice controlled sit<>stand from perched Stedy seat 10 reps x 3 sets with rest breaks in between *-focus on hip and trunk extension into standing. OT used Stedy to transfer pt back to bed. Max A to then return to supine. OT brought pt into sidelying with max A for L UE NMR. OT provided joint input through wrist and elbow to bring pt through full ROM. Some trace activation in pecs and scapular elevation/depression. Pt left semi-reclined in bed at end of session with bed alarm on, call bell in reach, and needs met.   Therapy Documentation Precautions:  Precautions Precautions: None Restrictions Weight Bearing Restrictions: Yes LUE Weight Bearing: Non weight bearing RLE Weight Bearing: Weight bearing as tolerated LLE Weight Bearing: Non weight bearing Pain:  denies pain Therapy/Group: Individual Therapy  Valma Cava 08/31/2020, 2:29 PM

## 2020-08-31 NOTE — Progress Notes (Signed)
Occupational Therapy Session Note  Patient Details  Name: Todd Villa MRN: [id number] Date of Birth: 02-07-1971  Today's Date: 08/31/2020 OT Individual Time: 0272-5366 OT Individual Time Calculation (min): 29 min    Short Term Goals: Week 1:  OT Short Term Goal 1 (Week 1): Pt will sit EOB wiht MIN A for dynamic sitting balnace OT Short Term Goal 2 (Week 1): Pt will transfer wiht MOD A to BSC and LRAD OT Short Term Goal 3 (Week 1): Pt will locate all grooming items on L of sink OT Short Term Goal 4 (Week 1): Pt will don shirt wiht S  Skilled Therapeutic Interventions/Progress Updates:    Pt received semi-reclined in bed, agreeable to therapy. With elevated HOB, came to sitting EOB with overall mod A to progress BLE and to lift trunk into sitting. 1 posterior LOB req mod A to correct. Doffed shirt with min VCs to pull over head. Donned new shirt with overall min A to begin threading LUE and mod VCs for sequencing hemi-technique. Donned shorts with max A to thread BLE, pt able to assist to pull up towards thighs and attempted to thread RLE. STS in stedy with mod A + 2 from elevated surface. Initially attempted from lowered bed height and with + 1, pt with significant difficulty following VCs for RUE placement and technique to power up. Seated in w/c at sink, brushed teeth with one handed tech, however req min VCs for error awareness as tooth paste had completely fallen off brush and pt unaware. Pt additionally labile at times when speaking about family throughout session.  Reviewed scapular mobility exercises and visual motor therapy for LUE NMR. Pt agreeable to sit up in w/c until next therapy, LUE supported on hospital table with safety belt alarm engaged, call bell in reach, and all immediate needs met.    Therapy Documentation Precautions:  Precautions Precautions: None Restrictions Weight Bearing Restrictions: Yes LUE Weight Bearing: Non weight bearing RLE Weight Bearing: Weight  bearing as tolerated LLE Weight Bearing: Non weight bearing Pain: Denies pain ADL: See Care Tool for more details.   Therapy/Group: Individual Therapy  Volanda Napoleon MS, OTR/L  08/31/2020, 12:39 PM

## 2020-08-31 NOTE — Progress Notes (Signed)
Orthopedic Tech Progress Note Patient Details:  ACEA YAGI Jan 14, 1971 [id number] Called in order to [employer] for a PRAFO BOOT Patient ID: Todd Villa, male   DOB: 02-27-1971, 50 y.o.   MRN: [id number]   Janit Pagan 08/31/2020, 1:17 PM

## 2020-08-31 NOTE — Progress Notes (Signed)
Physical Therapy Session Note  Patient Details  Name: FINNEGAN GATTA MRN: [id number] Date of Birth: [birth year]  Today's Date: 08/31/2020 PT Individual Time: 0805-0859 PT Individual Time Calculation (min): 54 min   Short Term Goals: Week 1:  PT Short Term Goal 1 (Week 1): Pt will perform bed mobility with max assist of 1. PT Short Term Goal 2 (Week 1): Pt will transfer to and from [facility] with max assist PT Short Term Goal 3 (Week 1): Pt will propel WC 57ft with min assist  Skilled Therapeutic Interventions/Progress Updates:     Pt received supine in bed and agrees to therapy. Reports pain in buttocks. Number not provided. PT provides rest breaks and repositioning to manage pain. Pt performs bed mobility with modA and cues for hand placement and body mechanics. At EOB pt demonstrating consistent R lateral lean. PT provides minA to bring to midline Pt verbalizes need to for bowel movement. Sit to stand to [facility] form EOB with modA. Pt transfers to [facility] with stedy and dependent A. PT provides cues for hand placement and positioning for transfer, as well as cues at trunk and hips for improved safety and stability on BSC. Pt requires maxA to stand to [place] from [facility]. And totalA for pericare while standing in [place], with minA/modA at L trunk due to postural instability. Stedy transfer back to bed pt perform sit to supine with maxA. Left supine in bed with alarm intact and all needs within reach.  Therapy Documentation Precautions:  Precautions Precautions: None Restrictions Weight Bearing Restrictions: Yes LUE Weight Bearing: Non weight bearing RLE Weight Bearing: Weight bearing as tolerated LLE Weight Bearing: Non weight bearing  Therapy/Group: Individual Therapy  Breck Coons, PT, DPT 08/31/2020, 8:34 AM

## 2020-08-31 NOTE — Progress Notes (Signed)
Patient ID: Todd Villa, male   DOB: Jun 23, 1970, 50 y.o.   MRN: [id number]  SW spoke with [name] [phone]) to discuss discharge plan. Reports pt will d/c to home, and he will have assistance from other family members that live in the home. She also mentioned pt presenting with increased anxiety and confusion over the weekend. SW informed will relay concerns to medical team.  SW met with pt and called [name] while in room to provide updates from team conference, and ELOS 4 weeks. SW informed there will continue to be updates.   SW returned phone call to [facility] [phone]) to discuss concerns for treatment needed for patient. 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 States she would like to schedule patient for an initial appointment in their clinic, and will make a referral to a local cancer center so he can begin treatment. She confirms that she has spoken to patient's wife already. SW informed will speak with physician and will follow-up.  1605- SW left message for Dewitt Rota inquiring if a virtual appointment is possible. If not SW requested the latest afternoon appointment so pt can complete therapies, if not, the earliest morning appointment as coordination of transportation is a concern. SW waiting on follow-up.   Loralee Pacas, MSW, [place] Office: [phone] Cell: [phone] Fax: [phone]

## 2020-08-31 NOTE — Progress Notes (Signed)
PROGRESS NOTE   Subjective/Complaints: Floaters/"objects" in Right upper quadrant of vision per team. Pt did not mention these to me this morning. Perseverating on them at times. Wife concerned about anxiety too.   ROS: Limited due to cognitive/behavioral   Objective:   CT HEAD WO CONTRAST  Addendum Date: 08/30/2020   ADDENDUM REPORT: 08/30/2020 21:46 ADDENDUM: These results were called by telephone at the time of interpretation on 08/30/2020 at 9:46 pm to provider PA [place], who verbally acknowledged these results. Electronically Signed   By: Kellie Simmering DO   On: 08/30/2020 21:46   Result Date: 08/30/2020 CLINICAL DATA:  Mental status change, unknown cause; headaches, visual changes-new, GBM with surgery at [facility]. EXAM: CT HEAD WITHOUT CONTRAST TECHNIQUE: Contiguous axial images were obtained from the base of the skull through the vertex without intravenous contrast. COMPARISON:  Prior brain MRI 08/17/2020. FINDINGS: Brain: Cerebral volume is normal for age. Since the prior MRI of 08/17/2020, there has been interval right-sided craniotomy/cranioplasty for resection of a right cerebral mass. Also new from this prior exam, there is a cystic focus within the right frontoparietal lobes extending inferiorly toward the right basal ganglia and thalamus measuring 4.2 x 4.8 x 4.6 cm (AP x TV x CC) compatible with a resection cavity. Persistent fairly extensive predominantly white matter hypoattenuation within the surrounding right frontoparietal lobes extending into the white matter tracks inferiorly and also into the right temporal lobe. Findings may reflect edema and/or infiltrative tumor. Also new from the prior exam, there is a mixed attenuation extra-axial fluid collection overlying the right frontoparietal lobes measuring up to 8 mm in greatest thickness. There are hyperdense components within the collection posteriorly, compatible with a small  amount of acute/subacute hemorrhage (for instance as seen on series 5, image 53). Persistent mass effect with partial effacement of the right lateral ventricle. 6 mm leftward midline shift measured at the level of the septum pellucidum (previously 7 mm). No definite demarcated cortical infarct. Vascular: No hyperdense vessel.  Atherosclerotic calcifications. Skull: Interval right craniotomy/cranioplasty. No calvarial fracture. Sinuses/Orbits: Visualized orbits show no acute finding. Mild mucosal thickening within the inferior left maxillary sinus. Trace bilateral ethmoid sinus mucosal thickening. Other: Scalp fluid collection overlying the cranioplasty measuring up to 15 mm in thickness (for instance as seen on series 3, image 20). IMPRESSION: There has been interval, presumed partial, resection of a tumor centered within the deep right frontoparietal region. 4.2 x 4.8 x 4.6 cm resection cavity at this site. Persistent fairly extensive predominantly white matter hypoattenuation within the surrounding frontoparietal lobes extending inferiorly and also into the right temporal lobe. This likely reflects a combination of edema and residual infiltrative tumor. Persistent mass effect with partial effacement of the right lateral ventricle. 6 mm leftward midline shift (previously 7 mm). New mixed attenuation extra-axial collection overlying the right frontoparietal lobes measuring up to 8 mm in greatest thickness. Small amount of acute/early subacute hemorrhage within the collection posteriorly. Scalp fluid collection overlying the cranioplasty measuring up to 15 mm in thickness. Mild paranasal sinus disease as described. Electronically Signed: By: Kellie Simmering DO On: 08/30/2020 20:07   IR IVC FILTER PLMT / S&I Burke Keels GUID/MOD SED  Result Date: 08/29/2020 CLINICAL DATA:  DVT. Recent intracranial surgery, a relative contraindication to anticoagulation. Caval filtration requested. EXAM: INFERIOR VENACAVOGRAM IVC FILTER  PLACEMENT UNDER FLUOROSCOPY FLUOROSCOPY TIME:  1 minutes 24 seconds; 121 mGy TECHNIQUE: Patency of the right IJ vein was confirmed with ultrasound with image documentation. An appropriate skin site was determined. Skin site was marked, prepped with chlorhexidine, and draped using maximum barrier technique. The region was infiltrated locally with 1% lidocaine. Intravenous Fentanyl 5mcg and Versed 1mg  were administered as conscious sedation during continuous monitoring of the patient's level of consciousness and physiological / cardiorespiratory status by the radiology RN, with a total moderate sedation time of 42 minutes. Under real-time ultrasound guidance, the right IJ vein was accessed with a 21 gauge micropuncture needle; the needle tip within the vein was confirmed with ultrasound image documentation. The needle was exchanged over a 018 guidewire for a transitional dilator, which allow advancement of the [facility] wire into the IVC. A long 6 French vascular sheath was placed for inferior venacavography. This demonstrated no caval thrombus. Renal vein inflows were evident. The [facility] IVC filter was advanced through the sheath and successfully deployed under fluoroscopy at the L2 level. Followup cavagram demonstrates stable filter position and no evident complication. The sheath was removed and hemostasis achieved at the site. No immediate complication. IMPRESSION: 1. Normal IVC. No thrombus or significant anatomic variation. 2. Technically successful infrarenal IVC filter placement. This is a retrievable model. PLAN: This IVC filter is potentially retrievable. The patient will be assessed for filter retrieval by Interventional Radiology in approximately 8-12 weeks. Further recommendations regarding filter retrieval, continued surveillance or declaration of device permanence, will be made at that time. Electronically Signed   By: Lucrezia Europe M.D.   On: 08/29/2020 13:59   No results for input(s): WBC, HGB, HCT, PLT  in the last 72 hours. Recent Labs    08/30/20 0624  NA 133*  K 3.8  CL 100  CO2 26  GLUCOSE 111*  BUN 21*  CREATININE 0.77  CALCIUM 8.5*    Intake/Output Summary (Last 24 hours) at 08/31/2020 0957 Last data filed at 08/31/2020 0831 Gross per 24 hour  Intake 320 ml  Output 1200 ml  Net -880 ml        Physical Exam: Vital Signs Blood pressure 125/73, pulse 61, temperature 98.6 F (37 C), temperature source Oral, resp. rate 18, height 6\' 3"  (1.905 m), weight (!) 147.5 kg, SpO2 99 %. Constitutional: No distress . Vital signs reviewed. HEENT: EOMI, oral membranes moist Neck: supple Cardiovascular: RRR without murmur. No JVD    Respiratory/Chest: CTA Bilaterally without wheezes or rales. Normal effort    GI/Abdomen: BS +, non-tender, non-distended Ext: no clubbing, cyanosis, or edema Psych: flat, cooperative Skin: scalp wound CDI with staples in place Neuro: Pt is cognitively appropriate with reasonable insight, memory, and awareness. Cranial nerves 2-12 are intact. Sensory exam is normal. Reflexes are 2+ on left. Mild flexor tone LUE is ongoing. Fine motor coordination is intact. No tremors. Motor function is grossly 5/5 RUE and RLE and 1+ LUE and LLE prox to trace/absent distally.   Musculoskeletal: Full ROM, No pain with AROM or PROM in the neck, trunk, or extremities. Posture appropriate. Edema LUE trace as well as tr to 1+ pedal edema. Left heel cord remains tight.        Assessment/Plan: 1. Functional deficits which require 3+ hours per day of interdisciplinary therapy in a comprehensive inpatient rehab setting.  Physiatrist is providing close  team supervision and 24 hour management of active medical problems listed below.  Physiatrist and rehab team continue to assess barriers to discharge/monitor patient progress toward functional and medical goals  Care Tool:  Bathing    Body parts bathed by patient: Left arm,Chest,Abdomen,Front perineal area,Face   Body parts  bathed by helper: Right arm,Buttocks,Right upper leg,Left upper leg,Right lower leg,Left lower leg     Bathing assist Assist Level: Dependent - Patient 0%     Upper Body Dressing/Undressing Upper body dressing   What is the patient wearing?: Pull over shirt    Upper body assist Assist Level: Moderate Assistance - Patient 50 - 74%    Lower Body Dressing/Undressing Lower body dressing      What is the patient wearing?: Incontinence brief     Lower body assist Assist for lower body dressing: Dependent - Patient 0%     Toileting Toileting    Toileting assist Assist for toileting: Total Assistance - Patient < 25%     Transfers Chair/bed transfer  Transfers assist  Chair/bed transfer activity did not occur: Safety/medical concerns  Chair/bed transfer assist level: Moderate Assistance - Patient 50 - 74%     Locomotion Ambulation   Ambulation assist   Ambulation activity did not occur: Safety/medical concerns          Walk 10 feet activity   Assist  Walk 10 feet activity did not occur: Safety/medical concerns        Walk 50 feet activity   Assist Walk 50 feet with 2 turns activity did not occur: Safety/medical concerns         Walk 150 feet activity   Assist Walk 150 feet activity did not occur: Safety/medical concerns         Walk 10 feet on uneven surface  activity   Assist Walk 10 feet on uneven surfaces activity did not occur: Safety/medical concerns         Wheelchair     Assist Will patient use wheelchair at discharge?: Yes Type of Wheelchair: Manual Wheelchair activity did not occur: Safety/medical concerns         Wheelchair 50 feet with 2 turns activity    Assist    Wheelchair 50 feet with 2 turns activity did not occur: Safety/medical concerns       Wheelchair 150 feet activity     Assist  Wheelchair 150 feet activity did not occur: Safety/medical concerns       Blood pressure 125/73, pulse 61,  temperature 98.6 F (37 C), temperature source Oral, resp. rate 18, height 6\' 3"  (1.905 m), weight (!) 147.5 kg, SpO2 99 %. Medical Problem List and Plan: 1.  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 Boot brought in is CAM walker---will order PRAFO  -visual hallucinations on right? CT with  2.  Antithrombotics: -DVT/anticoagulation:  Pharmaceutical: Lovenox  -Dopplers 3/19 revealed thrombus in the left popliteal and peroneal veins. -IR has placed IVCF  -activity as tolerated             -antiplatelet therapy: N/A 3. Pain Management: Oxycodone prn. Will continue tylenol  qid.  -continue HS topamax for headaches and to assist with sleep   Increased topamax to 50mg .  4. Mood/sleep:               -antipsychotic agents: N/A  -trazodone 100mg  qhs with melatonin  -will add xanax for anxiety 0.5mg  bid prn 5. Neuropsych: This patient is partially capable of making decisions on her own behalf. 6. Skin/Wound Care: Routine pressure relief measures.  7. Fluids/Electrolytes/Nutrition:  encourage PO  -protein supp for low albumin 8. Hypotension: Monitor BP tid--off Cozaar at this time.  9. ABLA due to UGIB s/p hemo clip: hgb 8.7 today 3/19 10 Leukocytosis: WBC 19.0 at admission. Up to 36k now 31k 3/19  -likely related to steroids and DVT. No fever, s/s infection currently  -recheck next week 11. Cerebral edema: down to 6 mm shift             --given visual phenomena described, have increased decadron back to 4mg  q12  -CT findings appear to be what would be expected post-op 12. 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  13. Constipation: Resolved with augmentation of medications.             --reports good results after suppository.  14.  Hyponatremia: Na 133, monitor weekly   LOS: 4 days A FACE TO FACE EVALUATION WAS PERFORMED  Meredith Staggers 08/31/2020, 9:57 AM

## 2020-08-31 NOTE — Patient Care Conference (Signed)
Inpatient RehabilitationTeam Conference and Plan of Care Update Date: 08/31/2020   Time: 10:38 AM    Patient Name: Todd Villa      Medical Record Number: [id number]  Date of Birth: 07-24-1970 Sex: Male         Room/Bed: 4W12C/4W12C-02 Payor Info: Payor: Theme park manager / Plan: UMR/[employer] PPO / Product Type: *No Product type* /    Admit Date/Time:  08/27/2020 11:42 AM  Primary Diagnosis:  Glioblastoma multiforme of brain [facility])  Hospital Problems: Principal Problem:   Glioblastoma multiforme of brain ([place]) Active Problems:   Acute deep vein thrombosis (DVT) of popliteal vein of left lower extremity (HCC)   Vascular headache   Sleep disorder   Left hemiparesis [facility])    Expected Discharge Date: Expected Discharge Date:  (4 weeks)  Team Members Present: Physician leading conference: Dr. Alger Simons Care Coodinator Present: Dorthula Nettles, RN, BSN, CRRN;Loralee Pacas, LCSWA Nurse Present: Dorthula Nettles, RN PT Present: Tereasa Coop, PT OT Present: Cherylynn Ridges, OT SLP Present: Weston Anna, SLP PPS Coordinator present : Gunnar Fusi, SLP     Current Status/Progress Goal Weekly Team Focus  Bowel/Bladder   occasionally incontinent of bladder: continent of Bowel. Last BM-3/20  to gain full continens  assess q shift and PRN   Swallow/Nutrition/ Hydration             ADL's   Brummstrom II in the LUE, Mod A UB ADLs, max-total A LB ADLs, max +2 stedy transfers. L inattention  min A, some supervision level  L NMR, sitting balance, ADL retraining, cognitive retraining, L attention   Mobility   modA bed mobility, maxA sit to stand, mod to maxA lateral scoot transfer  Supervision  increase time OOB, L hemibody NMR and strengthening, bed mobility, transfers, initiate ambulation   Communication             Safety/Cognition/ Behavioral Observations  Min-Mod A  Supervision-Mod I  attention, awareness, recall and problem solving   Pain   c/o pain 8 of 10 to head   pain<3  assess q shift and PRN   Skin   incision to head, sutures  healing incision without complications, no new breakdowns  assess q shift and PRN     Discharge Planning:  Pt reports that he will d/c to home and his wife will provide 24/7 care. SW waiting to verify support from wife.   Team Discussion: CT ordered due to confusion, no real changes, headaches, anxiety. Incontinent/continent B/B, complains of headache 10/10, appropriate medications given. Incisions to head, patient tries to pick at sutures. Educating on safety, incision care, cognition. Plan is to discharge home and has lots of family support. Patient on target to meet rehab goals: Yes, Max assist +2 with stedy for transfer, mod assist for bed mobility, mod/max assist for scoot transfer. Has min assist goals. Mod/max assist with PT, has generalized confusion. SLP working on attention, safety awareness, and problem solving. Has supervision to mod I goals.  *See Care Plan and progress notes for long and short-term goals.   Revisions to Treatment Plan:  MD increasing steroids with no taper.  Teaching Needs: Family education, medication management, pain management, skin/wound care, bowel/bladder management, transfer training, gait training, balance training, endurance training, stair training, safety awareness, anxiety management, emotional support.  Current Barriers to Discharge: Decreased caregiver support, Medical stability, Home enviroment access/layout, Incontinence, Wound care, Lack of/limited family support, Weight, Medication compliance, Pending chemo/radiation, Behavior and Nutritional means  Possible Resolutions to Barriers: Continue  current medications, offer nutritional support, provide emotional support.     Medical Summary Current Status: right GBM, prior CVA with left hemiparesis. cognitively imparied. LLE DVT--IVCF. right sided visual ?hallucinations, floaters. anxiety  Barriers to Discharge: Medical  stability;Behavior   Possible Resolutions to [employer]: adjustment of meds, increase steroids--no taper. ego support, anxiety rx. rx headache   Continued Need for Acute Rehabilitation Level of Care: The patient requires daily medical management by a physician with specialized training in physical medicine and rehabilitation for the following reasons: Direction of a multidisciplinary physical rehabilitation program to maximize functional independence : Yes Medical management of patient stability for increased activity during participation in an intensive rehabilitation regime.: Yes Analysis of laboratory values and/or radiology reports with any subsequent need for medication adjustment and/or medical intervention. : Yes   I attest that I was present, lead the team conference, and concur with the assessment and plan of the team.   Cristi Loron 08/31/2020, 4:45 PM

## 2020-08-31 NOTE — Progress Notes (Signed)
Speech Language Pathology Daily Session Note  Patient Details  Name: Todd Villa MRN: [id number] Date of Birth: 1970-12-31  Today's Date: 08/31/2020 SLP Individual Time: 1100-1155 SLP Individual Time Calculation (min): 55 min  Short Term Goals: Week 1: SLP Short Term Goal 1 (Week 1): Patient will demonstrate recall for new, daily information with supervision level verbal cues. SLP Short Term Goal 2 (Week 1): Patient will demonstrate selective attention in a mildly distracting enviornment for ~10 minutes with min A verbal cues. SLP Short Term Goal 3 (Week 1): Patient will demonstrate functional problem solving for mildly complex tasks with Supervision level cues. SLP Short Term Goal 4 (Week 1): Pt will increase left field scanning to increase safety during functional activities (mobility, reading, writing, etc) provided mod A verbal and visual cues SLP Short Term Goal 5 (Week 1): Pt will understand, recall and utilize compensatory strategies to increase speech intelligiblity with Supervision level cues.  Skilled Therapeutic Interventions: Skilled treatment session focused on cognitive goals. SLP facilitated session by providing extra time and overall Mod-Max A verbal and visual cues for organization and problem solving during a basic money management task. Patient demonstrated appropriate sustained attention to task but required Mod verbal cues to attend to right anterior spillage. SLP also generated a calendar to maximize recall of date in which he was able to utilize with Mod verbal and visual cues. Throughout session, patient required intermittent verbal cues to donn glassess appropriately due to left inattention. [name] Continue with current plan of care.      Pain No/Denies Pain   Therapy/Group: Individual Therapy  [name] 08/31/2020, 12:31 PM

## 2020-09-01 DIAGNOSIS — C719 Malignant neoplasm of brain, unspecified: Secondary | ICD-10-CM | POA: Diagnosis not present

## 2020-09-01 DIAGNOSIS — G441 Vascular headache, not elsewhere classified: Secondary | ICD-10-CM | POA: Diagnosis not present

## 2020-09-01 DIAGNOSIS — I82432 Acute embolism and thrombosis of left popliteal vein: Secondary | ICD-10-CM | POA: Diagnosis not present

## 2020-09-01 DIAGNOSIS — G8194 Hemiplegia, unspecified affecting left nondominant side: Secondary | ICD-10-CM | POA: Diagnosis not present

## 2020-09-01 LAB — URINALYSIS, COMPLETE (UACMP) WITH MICROSCOPIC
Bacteria, UA: NONE SEEN
Bilirubin Urine: NEGATIVE
Glucose, UA: NEGATIVE mg/dL
Hgb urine dipstick: NEGATIVE
Ketones, ur: NEGATIVE mg/dL
Leukocytes,Ua: NEGATIVE
Nitrite: NEGATIVE
Protein, ur: NEGATIVE mg/dL
Specific Gravity, Urine: 1.018 (ref 1.005–1.030)
pH: 6 (ref 5.0–8.0)

## 2020-09-01 LAB — CBC
HCT: 31.9 % — ABNORMAL LOW (ref 39.0–52.0)
Hemoglobin: 10.4 g/dL — ABNORMAL LOW (ref 13.0–17.0)
MCH: 31.6 pg (ref 26.0–34.0)
MCHC: 32.6 g/dL (ref 30.0–36.0)
MCV: 97 fL (ref 80.0–100.0)
Platelets: 328 10*3/uL (ref 150–400)
RBC: 3.29 MIL/uL — ABNORMAL LOW (ref 4.22–5.81)
RDW: 18.9 % — ABNORMAL HIGH (ref 11.5–15.5)
WBC: 24.6 10*3/uL — ABNORMAL HIGH (ref 4.0–10.5)
nRBC: 0 % (ref 0.0–0.2)

## 2020-09-01 LAB — GLUCOSE, CAPILLARY
Glucose-Capillary: 85 mg/dL (ref 70–99)
Glucose-Capillary: 111 mg/dL — ABNORMAL HIGH (ref 70–99)
Glucose-Capillary: 101 mg/dL — ABNORMAL HIGH (ref 70–99)
Glucose-Capillary: 119 mg/dL — ABNORMAL HIGH (ref 70–99)

## 2020-09-01 MED ORDER — DEXAMETHASONE 2 MG PO TABS
2.0000 mg | ORAL_TABLET | Freq: Two times a day (BID) | ORAL | Status: DC
Start: 2020-09-01 — End: 2020-09-10
  Administered 2020-09-01 – 2020-09-10 (×17): 2 mg via ORAL
  Filled 2020-09-01 (×18): qty 1

## 2020-09-01 NOTE — Progress Notes (Signed)
Occupational Therapy Session Note  Patient Details  Name: Todd Villa MRN: [id number] Date of Birth: 11-04-1970  Today's Date: 09/01/2020 OT Individual Time: 1000-1115 OT Individual Time Calculation (min): 75 min    Short Term Goals: Week 1:  OT Short Term Goal 1 (Week 1): Pt will sit EOB wiht MIN A for dynamic sitting balnace OT Short Term Goal 2 (Week 1): Pt will transfer wiht MOD A to BSC and LRAD OT Short Term Goal 3 (Week 1): Pt will locate all grooming items on L of sink OT Short Term Goal 4 (Week 1): Pt will don shirt wiht S  Skilled Therapeutic Interventions/Progress Updates:    1;1. Pt received in w/c with hips at [place]. Pt requires +2 A to scoot back to prevent fall. Pt completes bathing and dressing at sink seated with VC/MIN A for sitting balance using mirror for midline orientation. Pt requries HOH A to incorporate LUE into tasks. Pt tearful and confused this session stating, "how do you get people to sign up for these exeriments. I didn't sign up for this." Reorientaed pt to situation and provided support/empathetic listensin. Pt completes UB dressing MOD A and LB dressing with MAX A sit to stand at sink with MAX A for power up but able ot hold stance for OT to advance pants past hips. Despite frequent cuing, pt requires many reminders to scoot hips back into chair with MAX A from OT as pt instinctively scoots into post pelvic tilt near EOC. Pt engages in arm skate activity for NMR, however dmeo trace pec and shoulder activation when focused. Pt with poor sustained attention impacting activity. Pt often "watching his arm" and moving  His head in direction he wants his arm to go instead of focusing on moving arm from shoulder. Pt requires +2 squat pivot back to bed with bed rail used for closed chain transfer. Exited session with pt seated in bed, exit alarm on and call light in reach  Therapy Documentation Precautions:  Precautions Precautions: None Restrictions Weight  Bearing Restrictions: Yes LUE Weight Bearing: Non weight bearing RLE Weight Bearing: Weight bearing as tolerated LLE Weight Bearing: Non weight bearing General:   Vital Signs: Therapy Vitals Temp: 97.7 F (36.5 C) Pulse Rate: (!) 56 Resp: 18 BP: 103/61 Patient Position (if appropriate): Lying Oxygen Therapy SpO2: 98 % O2 Device: Room Air Pain:   ADL:   Vision   Perception    Praxis   Exercises:   Other Treatments:     Therapy/Group: Individual Therapy  Tonny Branch 09/01/2020, 6:57 AM

## 2020-09-01 NOTE — Progress Notes (Signed)
Speech Language Pathology Daily Session Note  Patient Details  Name: Todd Villa MRN: [id number] Date of Birth: 1970-09-10  Today's Date: 09/01/2020 SLP Individual Time: 1345-1425 SLP Individual Time Calculation (min): 40 min  Short Term Goals: Week 1: SLP Short Term Goal 1 (Week 1): Patient will demonstrate recall for new, daily information with supervision level verbal cues. SLP Short Term Goal 2 (Week 1): Patient will demonstrate selective attention in a mildly distracting enviornment for ~10 minutes with min A verbal cues. SLP Short Term Goal 3 (Week 1): Patient will demonstrate functional problem solving for mildly complex tasks with Supervision level cues. SLP Short Term Goal 4 (Week 1): Pt will increase left field scanning to increase safety during functional activities (mobility, reading, writing, etc) provided mod A verbal and visual cues SLP Short Term Goal 5 (Week 1): Pt will understand, recall and utilize compensatory strategies to increase speech intelligiblity with Supervision level cues.  Skilled Therapeutic Interventions: Skilled treatment session focused on cognitive goals. Upon arrival, patient was awake in bed and agreeable to participate in treatment session. SLP facilitated session by utilzing an anchor (yellow tape) to maximize attention to left field of environment during a 4-step picture sequencing task. Patient required overall Max A verbal and visual cues for problem solving and visual scanning. Throughout session, patient with decreased attention to tasks with low frustration tolerance compared to yesterday's session. Patient would instantly knock anything within his reach on his right environment on the floor including throwing a urinal out into the hallway. Patient also asking questions such as, "what would happen if I threw myself on the floor?" Patient educated on importance of maintaining safety precautions. RN made aware of behavior and comments. SLP placed all  4 bed rails up and suggested a telesitter. Patient left upright in bed with alarm on and all needs within reach.      Pain No/Denies Pain   Therapy/Group: Individual Therapy  Blu Lori 09/01/2020, 3:24 PM

## 2020-09-01 NOTE — Progress Notes (Signed)
Pt currently resting comfortably

## 2020-09-01 NOTE — Progress Notes (Signed)
Physical Therapy Session Note  Patient Details  Name: Todd Villa MRN: [id number] Date of Birth: 30-Nov-1970  Today's Date: 09/01/2020 PT Individual Time: 0805-0900 PT Individual Time Calculation (min): 55 min   Short Term Goals: Week 1:  PT Short Term Goal 1 (Week 1): Pt will perform bed mobility with max assist of 1. PT Short Term Goal 2 (Week 1): Pt will transfer to and from [facility] with max assist PT Short Term Goal 3 (Week 1): Pt will propel WC 88ft with min assist  Skilled Therapeutic Interventions/Progress Updates:     Pt received supine in bed and agrees to therapy, though appears more fatigued and less alert than previous sessions. Reports pain in head. PT provides rest breaks as needed to manage pain. Supine to sit with modA and cues on body mechanics and sequencing. Pt performs lateral scoot transfer/squat pivot to WC with maxA. WC transport go gym for time management. Pt utilizes standing frame to facilitate increased time standing and weight bearing through L hemibody. PT cues pt for hip extension and upright gaze and trunk to improve balance, with mirror provided for visual feedback. Pt able to stand for >5 minutes at a time prior to requesting seated rest break. Pt also works on L upper extremity activation while standing, with towel placed under arm to minimize friction. Pt noted to primarily utilize trunk to maneuver arm. PT provides pt with L lap tray following session. PT left seated in WC with alarm intact and all needs within reach.  Therapy Documentation Precautions:  Precautions Precautions: None Restrictions Weight Bearing Restrictions: Yes LUE Weight Bearing: Non weight bearing RLE Weight Bearing: Weight bearing as tolerated LLE Weight Bearing: Non weight bearing    Therapy/Group: Individual Therapy  Breck Coons, PT, DPT 09/01/2020, 4:34 PM

## 2020-09-01 NOTE — Progress Notes (Signed)
In room with pt administering medications. Pt is laughing and nothing in the room is being done or said. Pt is picking at sutures, threw empty med cup, attempted to throw water cup, and refused getting temperature. Xanax administered to pt for anxiety. Similar behaviors reported by day shift nurse. Will continue to monitor.

## 2020-09-01 NOTE — Progress Notes (Signed)
PROGRESS NOTE   Subjective/Complaints: Pt tells me he had a better night. Still seeing some floaters in left visual field. Spoke with Mrs Purdom this morning who expressed some concern over more aggressive behavior, irritability over last 2 days.   ROS: Patient denies fever, rash, sore throat, blurred vision, nausea, vomiting, diarrhea, cough, shortness of breath or chest pain, joint or back pain ,     Objective:   CT HEAD WO CONTRAST  Addendum Date: 08/30/2020   ADDENDUM REPORT: 08/30/2020 21:46 ADDENDUM: These results were called by telephone at the time of interpretation on 08/30/2020 at 9:46 pm to provider PA [place], who verbally acknowledged these results. Electronically Signed   By: Kellie Simmering DO   On: 08/30/2020 21:46   Result Date: 08/30/2020 CLINICAL DATA:  Mental status change, unknown cause; headaches, visual changes-new, GBM with surgery at [facility]. EXAM: CT HEAD WITHOUT CONTRAST TECHNIQUE: Contiguous axial images were obtained from the base of the skull through the vertex without intravenous contrast. COMPARISON:  Prior brain MRI 08/17/2020. FINDINGS: Brain: Cerebral volume is normal for age. Since the prior MRI of 08/17/2020, there has been interval right-sided craniotomy/cranioplasty for resection of a right cerebral mass. Also new from this prior exam, there is a cystic focus within the right frontoparietal lobes extending inferiorly toward the right basal ganglia and thalamus measuring 4.2 x 4.8 x 4.6 cm (AP x TV x CC) compatible with a resection cavity. Persistent fairly extensive predominantly white matter hypoattenuation within the surrounding right frontoparietal lobes extending into the white matter tracks inferiorly and also into the right temporal lobe. Findings may reflect edema and/or infiltrative tumor. Also new from the prior exam, there is a mixed attenuation extra-axial fluid collection overlying the right  frontoparietal lobes measuring up to 8 mm in greatest thickness. There are hyperdense components within the collection posteriorly, compatible with a small amount of acute/subacute hemorrhage (for instance as seen on series 5, image 53). Persistent mass effect with partial effacement of the right lateral ventricle. 6 mm leftward midline shift measured at the level of the septum pellucidum (previously 7 mm). No definite demarcated cortical infarct. Vascular: No hyperdense vessel.  Atherosclerotic calcifications. Skull: Interval right craniotomy/cranioplasty. No calvarial fracture. Sinuses/Orbits: Visualized orbits show no acute finding. Mild mucosal thickening within the inferior left maxillary sinus. Trace bilateral ethmoid sinus mucosal thickening. Other: Scalp fluid collection overlying the cranioplasty measuring up to 15 mm in thickness (for instance as seen on series 3, image 20). IMPRESSION: There has been interval, presumed partial, resection of a tumor centered within the deep right frontoparietal region. 4.2 x 4.8 x 4.6 cm resection cavity at this site. Persistent fairly extensive predominantly white matter hypoattenuation within the surrounding frontoparietal lobes extending inferiorly and also into the right temporal lobe. This likely reflects a combination of edema and residual infiltrative tumor. Persistent mass effect with partial effacement of the right lateral ventricle. 6 mm leftward midline shift (previously 7 mm). New mixed attenuation extra-axial collection overlying the right frontoparietal lobes measuring up to 8 mm in greatest thickness. Small amount of acute/early subacute hemorrhage within the collection posteriorly. Scalp fluid collection overlying the cranioplasty measuring up to 15 mm in thickness.  Mild paranasal sinus disease as described. Electronically Signed: By: Kellie Simmering DO On: 08/30/2020 20:07   No results for input(s): WBC, HGB, HCT, PLT in the last 72 hours. Recent Labs     08/30/20 0624  NA 133*  K 3.8  CL 100  CO2 26  GLUCOSE 111*  BUN 21*  CREATININE 0.77  CALCIUM 8.5*    Intake/Output Summary (Last 24 hours) at 09/01/2020 0829 Last data filed at 09/01/2020 0817 Gross per 24 hour  Intake 840 ml  Output 1200 ml  Net -360 ml        Physical Exam: Vital Signs Blood pressure 103/61, pulse (!) 56, temperature 97.7 F (36.5 C), resp. rate 18, height 6\' 3"  (1.905 m), weight (!) 147.5 kg, SpO2 98 %. Constitutional: No distress . Vital signs reviewed. HEENT: EOMI, oral membranes moist Neck: supple Cardiovascular: RRR without murmur. No JVD    Respiratory/Chest: CTA Bilaterally without wheezes or rales. Normal effort    GI/Abdomen: BS +, non-tender, non-distended Ext: no clubbing, cyanosis, or edema Psych: flat but cooperative Skin: scalp incision cdi  Neuro: pt alert. Limited insight and awareness. Follows basic commands.  Reflexes are 2+ on left. Mild flexor tone LUE is ongoing. Fine motor coordination is intact. No tremors. Motor function is grossly 5/5 RUE and RLE and 1+ LUE and LLE prox to trace/absent distally---no motoric changes   Musculoskeletal: Full ROM, No pain with AROM or PROM in the neck, trunk, or extremities. Posture appropriate. Edema LUE trace as well as tr to 1+ pedal edema. Left heel cord remains tight.        Assessment/Plan: 1. Functional deficits which require 3+ hours per day of interdisciplinary therapy in a comprehensive inpatient rehab setting.  Physiatrist is providing close team supervision and 24 hour management of active medical problems listed below.  Physiatrist and rehab team continue to assess barriers to discharge/monitor patient progress toward functional and medical goals  Care Tool:  Bathing    Body parts bathed by patient: Left arm,Chest,Abdomen,Front perineal area,Face   Body parts bathed by helper: Right arm,Buttocks,Right upper leg,Left upper leg,Right lower leg,Left lower leg     Bathing assist  Assist Level: Dependent - Patient 0%     Upper Body Dressing/Undressing Upper body dressing   What is the patient wearing?: Pull over shirt    Upper body assist Assist Level: Minimal Assistance - Patient > 75%    Lower Body Dressing/Undressing Lower body dressing      What is the patient wearing?: Pants     Lower body assist Assist for lower body dressing: 2 Helpers (dep in stedy for STS with + 2 to power up)     Toileting Toileting    Toileting assist Assist for toileting: Total Assistance - Patient < 25%     Transfers Chair/bed transfer  Transfers assist  Chair/bed transfer activity did not occur: Safety/medical concerns  Chair/bed transfer assist level: Dependent - Patient 0% Charlaine Dalton)     Locomotion Ambulation   Ambulation assist   Ambulation activity did not occur: Safety/medical concerns          Walk 10 feet activity   Assist  Walk 10 feet activity did not occur: Safety/medical concerns        Walk 50 feet activity   Assist Walk 50 feet with 2 turns activity did not occur: Safety/medical concerns         Walk 150 feet activity   Assist Walk 150 feet activity did not occur:  Safety/medical concerns         Walk 10 feet on uneven surface  activity   Assist Walk 10 feet on uneven surfaces activity did not occur: Safety/medical concerns         Wheelchair     Assist Will patient use wheelchair at discharge?: Yes Type of Wheelchair: Manual Wheelchair activity did not occur: Safety/medical concerns         Wheelchair 50 feet with 2 turns activity    Assist    Wheelchair 50 feet with 2 turns activity did not occur: Safety/medical concerns       Wheelchair 150 feet activity     Assist  Wheelchair 150 feet activity did not occur: Safety/medical concerns       Blood pressure 103/61, pulse (!) 56, temperature 97.7 F (36.5 C), resp. rate 18, height 6\' 3"  (1.905 m), weight (!) 147.5 kg, SpO2 98 %. Medical  Problem List and Plan: 1.  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 spoke to Mrs. Schedler about potentially moving his neuro-onc care here to [facility] and she is in favor of that.  2.  Antithrombotics: -DVT/anticoagulation:  Pharmaceutical: Lovenox  -Dopplers 3/19 revealed thrombus in the left popliteal and peroneal veins. -IR has placed IVCF  -activity as tolerated             -antiplatelet therapy: N/A 3. Pain Management: Oxycodone prn. Will continue tylenol qid.  -continue HS topamax for headaches and to assist with sleep   Increased topamax to 50mg .  4. Mood/sleep:               -antipsychotic agents: N/A  -trazodone 100mg  qhs with melatonin  -added xanax for anxiety 0.5mg  bid prn 5. Neuropsych: This patient is partially capable of making decisions on her own behalf. 6. Skin/Wound Care: Routine pressure relief measures.  7. Fluids/Electrolytes/Nutrition:  encourage PO  -protein supp for low albumin 8. Hypotension: Monitor BP tid--off Cozaar at this time.  9. ABLA due to UGIB s/p hemo clip: hgb 8.7 today 3/19 10 Leukocytosis: WBC 19.0 at admission. Up to 36k now 31k 3/19  -likely related to steroids and DVT. No fever, s/s infection currently  -3/23 recheck today given behavioral changes   -also check UA/UCX 11. Cerebral edema: down to 6 mm shift             --given visual phenomena described, have increased decadron back to 4mg  q12  -CT findings appear to be what would be expected post-op 12. 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  13. Constipation: Resolved with augmentation of medications.             --reports good results after  suppository.  14. Hyponatremia: Na 133, monitor weekly   LOS: 5 days A FACE TO FACE EVALUATION WAS PERFORMED  Meredith Staggers 09/01/2020, 8:29 AM

## 2020-09-01 NOTE — Progress Notes (Addendum)
Patient ID: Todd Villa, male   DOB: Jan 22, 1971, 51 y.o.   MRN: [id number]  SW received updates from attending pt wife would like to keep patient treatment here in [place], and currently working on coordinating care.   SW returned phone call to [facility] [phone]) to discuss plan of care for pt. SW waiting on follow-up.  *SW spoke with Dewitt Rota to inform on above with coordinating care here in [place] per wife's report to attending. States that pt is currently scheduled for Friday, April 1 at [place] informed will follow-up with updates once received.   1203pm- SW received phone call from [name] stating that the physician's at [facility] were adamant about care staying within their system, and she wanted to go along with their recommendations. SW informed will provide updates after speaking with attending.  1502- Updates from attending is that pt will receive his radiation treatment here at [facility]. SW waiting on follow-up on when appointment will be scheduled.   Loralee Pacas, MSW, [place] Office: [phone] Cell: [phone] Fax: [phone]

## 2020-09-02 DIAGNOSIS — R4586 Emotional lability: Secondary | ICD-10-CM

## 2020-09-02 DIAGNOSIS — C719 Malignant neoplasm of brain, unspecified: Secondary | ICD-10-CM | POA: Diagnosis not present

## 2020-09-02 DIAGNOSIS — G8194 Hemiplegia, unspecified affecting left nondominant side: Secondary | ICD-10-CM | POA: Diagnosis not present

## 2020-09-02 DIAGNOSIS — I82432 Acute embolism and thrombosis of left popliteal vein: Secondary | ICD-10-CM | POA: Diagnosis not present

## 2020-09-02 LAB — BASIC METABOLIC PANEL
Anion gap: 7 (ref 5–15)
BUN: 18 mg/dL (ref 6–20)
CO2: 25 mmol/L (ref 22–32)
Calcium: 8.6 mg/dL — ABNORMAL LOW (ref 8.9–10.3)
Chloride: 103 mmol/L (ref 98–111)
Creatinine, Ser: 0.83 mg/dL (ref 0.61–1.24)
GFR, Estimated: >60 mL/min (ref >60)
Glucose, Bld: 129 mg/dL — ABNORMAL HIGH (ref 70–99)
Potassium: 3.6 mmol/L (ref 3.5–5.1)
Sodium: 135 mmol/L (ref 135–145)

## 2020-09-02 LAB — CBC
HCT: 30 % — ABNORMAL LOW (ref 39.0–52.0)
Hemoglobin: 9.8 g/dL — ABNORMAL LOW (ref 13.0–17.0)
MCH: 31.6 pg (ref 26.0–34.0)
MCHC: 32.7 g/dL (ref 30.0–36.0)
MCV: 96.8 fL (ref 80.0–100.0)
Platelets: 291 10*3/uL (ref 150–400)
RBC: 3.1 MIL/uL — ABNORMAL LOW (ref 4.22–5.81)
RDW: 18.7 % — ABNORMAL HIGH (ref 11.5–15.5)
WBC: 15.8 10*3/uL — ABNORMAL HIGH (ref 4.0–10.5)
nRBC: 0 % (ref 0.0–0.2)

## 2020-09-02 LAB — URINE CULTURE: Culture: <10000 — AB

## 2020-09-02 LAB — GLUCOSE, CAPILLARY
Glucose-Capillary: 103 mg/dL — ABNORMAL HIGH (ref 70–99)
Glucose-Capillary: 86 mg/dL (ref 70–99)
Glucose-Capillary: 148 mg/dL — ABNORMAL HIGH (ref 70–99)
Glucose-Capillary: 120 mg/dL — ABNORMAL HIGH (ref 70–99)

## 2020-09-02 MED ORDER — TRAZODONE HCL 50 MG PO TABS
50.0000 mg | ORAL_TABLET | Freq: Every day | ORAL | Status: DC
  Administered 2020-09-02: 50 mg via ORAL
  Filled 2020-09-02: qty 1

## 2020-09-02 MED ORDER — QUETIAPINE FUMARATE 25 MG PO TABS
25.0000 mg | ORAL_TABLET | Freq: Two times a day (BID) | ORAL | Status: DC
  Administered 2020-09-02 – 2020-09-08 (×13): 25 mg via ORAL
  Filled 2020-09-02 (×13): qty 1

## 2020-09-02 NOTE — Progress Notes (Signed)
Speech Language Pathology Daily Session Note  Patient Details  Name: Todd Villa MRN: [id number] Date of Birth: 1971/05/21  Today's Date: 09/02/2020 SLP Individual Time: 4431-5400 SLP Individual Time Calculation (min): 26 min  Short Term Goals: Week 1: SLP Short Term Goal 1 (Week 1): Patient will demonstrate recall for new, daily information with supervision level verbal cues. SLP Short Term Goal 2 (Week 1): Patient will demonstrate selective attention in a mildly distracting enviornment for ~10 minutes with min A verbal cues. SLP Short Term Goal 3 (Week 1): Patient will demonstrate functional problem solving for mildly complex tasks with Supervision level cues. SLP Short Term Goal 4 (Week 1): Pt will increase left field scanning to increase safety during functional activities (mobility, reading, writing, etc) provided mod A verbal and visual cues SLP Short Term Goal 5 (Week 1): Pt will understand, recall and utilize compensatory strategies to increase speech intelligiblity with Supervision level cues.  Skilled Therapeutic Interventions: Pt was seen for skilled ST targeting cognitive goals. SLP facilitated session with a basic calendar making task. Pt demonstrated ability to sequence number when creating a March calendar with 90% accuracy, however he required Min A verbal and visual cues for visual scanning throughout task. He also required Max A verbal and visual cueing for awareness and demonstration of which numbers he had placed on the calendar upside down, and Moderate verbal and visual cueing to problem solve how to fix that. He was somewhat internally distractible, asking many questions throughout session indicative of mild confusion but also some awareness of his deficits (ex: "When did you do the immaculate conception? Have a I ever been violent toward you?" Etc.). Pt also disoriented to place today, requiring verbal cue to recall he was in the hospital, but he was independently  oriented to situation. Pt left laying in bed with alarm set and 4 rails up, telesitter present, call bell in hand. Continue per current plan of care.         Pain Pain Assessment Pain Scale: Faces Pain Score: 0-No pain Faces Pain Scale: No hurt  Therapy/Group: Individual Therapy  Arbutus Leas 09/02/2020, 7:21 AM

## 2020-09-02 NOTE — Progress Notes (Signed)
Speech Language Pathology Daily Session Note  Patient Details  Name: Todd Villa MRN: [id number] Date of Birth: 07-25-1970  Today's Date: 09/02/2020 SLP Individual Time: 0710-0755 SLP Individual Time Calculation (min): 45 min  Short Term Goals: Week 1: SLP Short Term Goal 1 (Week 1): Patient will demonstrate recall for new, daily information with supervision level verbal cues. SLP Short Term Goal 2 (Week 1): Patient will demonstrate selective attention in a mildly distracting enviornment for ~10 minutes with min A verbal cues. SLP Short Term Goal 3 (Week 1): Patient will demonstrate functional problem solving for mildly complex tasks with Supervision level cues. SLP Short Term Goal 4 (Week 1): Pt will increase left field scanning to increase safety during functional activities (mobility, reading, writing, etc) provided mod A verbal and visual cues SLP Short Term Goal 5 (Week 1): Pt will understand, recall and utilize compensatory strategies to increase speech intelligiblity with Supervision level cues.  Skilled Therapeutic Interventions: Skilled treatment session focused on cognitive goals. SLP facilitated session by providing set-up assist with breakfast meal of regular textures with thin liquids. SLP provided set-up assist and overall Min A verbal cues for functional problem solving and overall selective attention to task for ~30 minutes. Patient was more cooperative today and asking appropriate questions regarding his overall cognitive functioning. Patient observed talking to his wife on the phone and did appear confused regarding visitors, etc. Patient made full supervision with meals to maximize safety and efficiency with PO intake. Patient left upright in bed with alarm on and all needs within reach. Continue with current plan of care.      Pain No/Denies Pain   Therapy/Group: Individual Therapy  Tarin Johndrow 09/02/2020, 7:59 AM

## 2020-09-02 NOTE — Progress Notes (Addendum)
PROGRESS NOTE   Subjective/Complaints: Pt tells me he had a better night. Still seeing some floaters in left visual field. Spoke with Mrs Arnone this morning who expressed some concern over more aggressive behavior, irritability over last 2 days.   ROS: Patient denies fever, rash, sore throat, blurred vision, nausea, vomiting, diarrhea, cough, shortness of breath or chest pain, joint or back pain ,     Objective:   No results found. Recent Labs    09/01/20 1000 09/02/20 0814  WBC 24.6* 15.8*  HGB 10.4* 9.8*  HCT 31.9* 30.0*  PLT 328 291   Recent Labs    09/02/20 0814  NA 135  K 3.6  CL 103  CO2 25  GLUCOSE 129*  BUN 18  CREATININE 0.83  CALCIUM 8.6*    Intake/Output Summary (Last 24 hours) at 09/02/2020 1013 Last data filed at 09/02/2020 0900 Gross per 24 hour  Intake 660 ml  Output 775 ml  Net -115 ml        Physical Exam: Vital Signs Blood pressure 113/69, pulse (!) 57, temperature 97.8 F (36.6 C), temperature source Oral, resp. rate 16, height 6\' 3"  (1.905 m), weight (!) 147.5 kg, SpO2 99 %. Constitutional: No distress . Vital signs reviewed. HEENT: EOMI, oral membranes moist Neck: supple Cardiovascular: RRR without murmur. No JVD    Respiratory/Chest: CTA Bilaterally without wheezes or rales. Normal effort    GI/Abdomen: BS +, non-tender, non-distended Ext: no clubbing, cyanosis, or edema Psych: flat but cooperative Skin: scalp incision cdi  Neuro: pt alert. Limited insight and awareness. Follows basic commands.  Reflexes are 2+ on left. Mild flexor tone LUE is ongoing. Fine motor coordination is intact. No tremors. Motor function is grossly 5/5 RUE and RLE and 1+ LUE and LLE prox to trace/absent distally---no motoric changes   Musculoskeletal: Full ROM, No pain with AROM or PROM in the neck, trunk, or extremities. Posture appropriate. Edema LUE trace as well as tr to 1+ pedal edema. Left heel cord  remains tight.        Assessment/Plan: 1. Functional deficits which require 3+ hours per day of interdisciplinary therapy in a comprehensive inpatient rehab setting.  Physiatrist is providing close team supervision and 24 hour management of active medical problems listed below.  Physiatrist and rehab team continue to assess barriers to discharge/monitor patient progress toward functional and medical goals  Care Tool:  Bathing    Body parts bathed by patient: Left arm,Chest,Abdomen,Front perineal area,Face   Body parts bathed by helper: Right arm,Buttocks,Right upper leg,Left upper leg,Right lower leg,Left lower leg     Bathing assist Assist Level: Dependent - Patient 0%     Upper Body Dressing/Undressing Upper body dressing   What is the patient wearing?: Pull over shirt    Upper body assist Assist Level: Minimal Assistance - Patient > 75%    Lower Body Dressing/Undressing Lower body dressing      What is the patient wearing?: Pants     Lower body assist Assist for lower body dressing: 2 Helpers (dep in stedy for STS with + 2 to power up)     Toileting Toileting    Toileting assist Assist for  toileting: Total Assistance - Patient < 25%     Transfers Chair/bed transfer  Transfers assist  Chair/bed transfer activity did not occur: Safety/medical concerns  Chair/bed transfer assist level: Maximal Assistance - Patient 25 - 49%     Locomotion Ambulation   Ambulation assist   Ambulation activity did not occur: Safety/medical concerns          Walk 10 feet activity   Assist  Walk 10 feet activity did not occur: Safety/medical concerns        Walk 50 feet activity   Assist Walk 50 feet with 2 turns activity did not occur: Safety/medical concerns         Walk 150 feet activity   Assist Walk 150 feet activity did not occur: Safety/medical concerns         Walk 10 feet on uneven surface  activity   Assist Walk 10 feet on uneven  surfaces activity did not occur: Safety/medical concerns         Wheelchair     Assist Will patient use wheelchair at discharge?: Yes Type of Wheelchair: Manual Wheelchair activity did not occur: Safety/medical concerns         Wheelchair 50 feet with 2 turns activity    Assist    Wheelchair 50 feet with 2 turns activity did not occur: Safety/medical concerns       Wheelchair 150 feet activity     Assist  Wheelchair 150 feet activity did not occur: Safety/medical concerns       Blood pressure 113/69, pulse (!) 57, temperature 97.8 F (36.6 C), temperature source Oral, resp. rate 16, height 6\' 3"  (1.905 m), weight (!) 147.5 kg, SpO2 99 %. Medical Problem List and Plan: 1.  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 I spoke with providers at [facility] as well as with Dr. Mickeal Skinner here, and Mrs. Davonna Belling. We will pursue oncological treatment through [facility]. Dr. Mickeal Skinner hopes to be by later today to see the patient. 2.  Antithrombotics: -DVT/anticoagulation:  Pharmaceutical: Lovenox  -Dopplers 3/19 revealed thrombus in the left popliteal and peroneal veins. -IR has placed IVCF  -activity as tolerated             -antiplatelet therapy: N/A 3. Pain Management: Oxycodone prn. Will continue tylenol qid.  -continue HS topamax for headaches and to assist with sleep   -Increased topamax to 50mg .  4. 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 Neuropsych: This patient is partially capable of making decisions on her own behalf. 6. Skin/Wound Care: Routine pressure relief measures.  7. Fluids/Electrolytes/Nutrition:  encourage PO  -protein supp  for low albumin 8. Hypotension: Monitor BP tid--off Cozaar at this time.  9. ABLA due to UGIB s/p hemo clip: hgb 8.7 today 3/19 10 Leukocytosis: WBC 19.0 at admission. Up to 36k now 31k 3/19  -likely related to steroids and DVT. No fever, s/s infection currently  -3/24 UA negative, ucx pending, wbcs' down to 15k today   -tapering steroids 11. Cerebral edema: down to 6 mm shift             --given visual phenomena described, have increased decadron back to 4mg  q12  -CT findings appear to be what would be expected post-op 12. 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  13. Constipation: Resolved with augmentation of medications.             --reports good results after suppository.  14. Hyponatremia: Na 133, monitor weekly  Greater than 35 total minutes was spent in examination of patient, assessment of pertinent data,  formulation of a treatment plan, and in discussion with patient and/or family.     LOS: 6 days A FACE TO FACE EVALUATION WAS PERFORMED  Meredith Staggers 09/02/2020, 10:13 AM

## 2020-09-02 NOTE — Progress Notes (Signed)
Patient ID: HELMUTH RECUPERO, male   DOB: 06-Apr-1971, 50 y.o.   MRN: [id number]  Entered room, introduced myself to patient and patient's sister, and explained my role in patient's care while on rehab. Explained to both that I was bringing additional handouts to add to his [place] concerning his diagnosis, and current medications he is taking. Currently the notebook is not in the room but the sister stated that his wife may have it with her and she would definitely let her know about the additional handouts. I let them know that I am here Monday through Friday and also available by phone if they had any questions. As I was putting my name and number on the white board the patient threw a bread roll that just happened to go over my head and I was able to catch it by reflex in my other hand, I guess you could call it a "[employer]!" However, he threw it again and it landed on the floor so it ended up in the trash anyway, oh well. Considering he's been throwing heavier objects, I consider myself lucky! I don't believe he even knows he does these things. I will continue to monitor his progress.  Dorthula Nettles, RN3, BSN, CBIS, [place], [facility], Inpatient Rehabilitation Office [phone] Cell [phone]

## 2020-09-02 NOTE — Progress Notes (Signed)
Occupational Therapy Session Note  Patient Details  Name: Todd Villa MRN: [id number] Date of Birth: 1970/12/01  Today's Date: 09/02/2020 OT Individual Time: 1000-1045 OT Individual Time Calculation (min): 45 min    Short Term Goals: Week 1:  OT Short Term Goal 1 (Week 1): Pt will sit EOB wiht MIN A for dynamic sitting balnace OT Short Term Goal 2 (Week 1): Pt will transfer wiht MOD A to BSC and LRAD OT Short Term Goal 3 (Week 1): Pt will locate all grooming items on L of sink OT Short Term Goal 4 (Week 1): Pt will don shirt wiht S  Skilled Therapeutic Interventions/Progress Updates:   'Patient greeted semi-reclined in bed and agreeable to OT treatment session. Pt perseverating on if he has ever been violent towards me bc he heard brain injuries can be violent. Pt also very labile and needed emotional support throughout session for frequent crying spells. Pt noted to have been incontinent of urine in the bed. Worked on rolling with pt able to roll R with min A, then max A to roll L. Total A peri-care and brief change. Pt needed max A to come to sitting EOB. Worked on LB dressing at EOB with max A. Sit<.stand in [place] with max A, then pt able to assist with pulling pants up in standing. Pt transferred to wc in stedy. Pt brought to the sink for UB bathing/dressing. Hand over hand assistance to integrate L UE into bathring tasks. Pt able to recall that he needed to dress L side first, but needed OT assist to complete. Pt left seated in wc at end of session with safety belt on call bell in reach, and L UE supported on lap tray.   Therapy Documentation Precautions:  Precautions Precautions: None Restrictions Weight Bearing Restrictions: Yes LUE Weight Bearing: Non weight bearing RLE Weight Bearing: Weight bearing as tolerated LLE Weight Bearing: Non weight bearing Pain: Pt reports headache, nrusing notified that pt would like pain meds Therapy/Group: Individual Therapy  Valma Cava 09/02/2020, 10:40 AM

## 2020-09-02 NOTE — Progress Notes (Addendum)
Patient ID: Todd Villa, male   DOB: 03-31-1971, 50 y.o.   MRN: [id number]  08/31/2020 - SW received updates from [place] at [facility], pt services ended on 3/7, and pt unable to return due to billing issues and payment.   Loralee Pacas, MSW, [place] Office: [phone] Cell: [phone] Fax: [phone]

## 2020-09-02 NOTE — Progress Notes (Signed)
Physical Therapy Session Note  Patient Details  Name: VINEET KINNEY MRN: [id number] Date of Birth: 1971-04-11  Today's Date: 09/02/2020 PT Missed Time: 78 Minutes Missed Time Reason:  (pt eating)  Short Term Goals: Week 1:  PT Short Term Goal 1 (Week 1): Pt will perform bed mobility with max assist of 1. PT Short Term Goal 2 (Week 1): Pt will transfer to and from [facility] with max assist PT Short Term Goal 3 (Week 1): Pt will propel WC 54ft with min assist  Skilled Therapeutic Interventions/Progress Updates:     Pt had just gotten back into bed and received lunch. Sister present and assisting pt to eat. PT will follow up as appropriate.  Therapy Documentation Precautions:  Precautions Precautions: None Restrictions Weight Bearing Restrictions: Yes LUE Weight Bearing: Non weight bearing RLE Weight Bearing: Weight bearing as tolerated LLE Weight Bearing: Non weight bearing    Therapy/Group: Individual Therapy  Breck Coons, PT, DPT 09/02/2020, 4:05 PM

## 2020-09-02 NOTE — Consult Note (Signed)
[facility] Consult Note  Patient Care Team: [facility] as PCP - General  CHIEF COMPLAINTS/PURPOSE OF CONSULTATION:  High Grade Glioma Hemiparesis  HISTORY OF PRESENTING ILLNESS:  Todd Villa 50 y.o. male presented to medical attention on 08/17/20 with several weeks progressive left sided weakness, as well as episode of loss of consciousness.  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 Lacinda Axon on 08/19/20.  Following surgery, course was complicated by anemia, GI bleed, cognitive impairment.  He has been at [facility] since 08/27/20.  He continues to describe limited meaningful function of his left side despite rehab efforts.  He has wife and sister at bedside today.    MEDICAL HISTORY:  Past Medical History:  Diagnosis Date  . High blood pressure   . Obesity   . 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 IR IVC FILTER PLMT / S&I /IMG GUID/MOD SED  08/29/2020  . ROTATOR CUFF REPAIR Left 2015    SOCIAL HISTORY: Social History   Socioeconomic History  . Marital status: Married    Spouse name: Not on file  . Number of children: Not on file  . Years of education: Not on file  . Highest education level: Not on file  Occupational History  . Not on file  Tobacco Use  . Smoking status: Current Every Day Smoker    Packs/day: 0.66    Years: 30.00    Pack years: 19.80    Types: Cigarettes  . Smokeless tobacco: Never Used  Substance and Sexual Activity  . Alcohol use: No    Alcohol/week: 0.0 standard drinks  . Drug use: No  . Sexual activity: Yes  Other Topics Concern  . Not on file  Social History Narrative  . Not on file   Social Determinants of Health   Financial Resource Strain: Not on file  Food Insecurity: Not on file  Transportation Needs: Not on file   Physical Activity: Not on file  Stress: Not on file  Social Connections: Not on file  Intimate Partner Violence: Not on file    FAMILY HISTORY: Family History  Problem Relation Age of Onset  . Hyperlipidemia Mother   . Heart disease Mother   . Hypertension Mother   . Diabetes Mother   . Alcohol abuse Father   . Heart disease Father   . Hypertension Father   . Diabetes Sister   . Cancer Neg Hx   . Stroke Neg Hx     ALLERGIES:  is allergic to penicillins.  MEDICATIONS:  Current Facility-Administered Medications  Medication Dose Route Frequency Provider Last Rate Last Admin  . acetaminophen (TYLENOL) tablet 650 mg  650 mg Oral TID WC & HS Bary Leriche, PA-C   650 mg at 09/02/20 1222  . ALPRAZolam Duanne Moron) tablet 0.5 mg  0.5 mg Oral BID PRN Meredith Staggers, MD   0.5 mg at 09/01/20 2104  . alum & mag hydroxide-simeth (MAALOX/MYLANTA) 200-200-20 MG/5ML suspension 30 mL  30 mL Oral Q4H PRN Love, Pamela S, PA-C      . atorvastatin (LIPITOR) tablet 10 mg  10 mg Oral QPC supper Reesa Chew S, PA-C   10 mg at 09/01/20 1728  . bacitracin ointment   Topical BID Bary Leriche, [place]   Given at 09/02/20 1025  . bisacodyl (DULCOLAX) suppository 10  mg  10 mg Rectal Daily PRN Bary Leriche, PA-C   10 mg at 08/29/20 1300  . dexamethasone (DECADRON) tablet 2 mg  2 mg Oral Q12H Meredith Staggers, MD   2 mg at 09/02/20 0859  . diphenhydrAMINE (BENADRYL) 12.5 MG/5ML elixir 12.5-25 mg  12.5-25 mg Oral Q6H PRN Love, Ivan Anchors, PA-C      . FLUoxetine (PROZAC) capsule 20 mg  20 mg Oral Daily Bary Leriche, PA-C   20 mg at 09/02/20 0859  . guaiFENesin-dextromethorphan (ROBITUSSIN DM) 100-10 MG/5ML syrup 5-10 mL  5-10 mL Oral Q6H PRN Love, Pamela S, PA-C      . insulin aspart (novoLOG) injection 0-5 Units  0-5 Units Subcutaneous QHS Love, Pamela S, PA-C      . insulin aspart (novoLOG) injection 0-9 Units  0-9 Units Subcutaneous TID WC LoveIvan Anchors, PA-C   1 Units at 08/31/20 1214  . levETIRAcetam  (KEPPRA) tablet 1,000 mg  1,000 mg Oral BID Bary Leriche, PA-C   1,000 mg at 09/02/20 0859  . melatonin tablet 3 mg  3 mg Oral QHS Meredith Staggers, MD   3 mg at 09/01/20 2058  . oxyCODONE (Oxy IR/ROXICODONE) immediate release tablet 5 mg  5 mg Oral Q4H PRN Bary Leriche, PA-C   5 mg at 08/31/20 0913  . pantoprazole (PROTONIX) EC tablet 40 mg  40 mg Oral Daily Bary Leriche, PA-C   40 mg at 09/02/20 0859  . polyethylene glycol (MIRALAX / GLYCOLAX) packet 17 g  17 g Oral Daily Bary Leriche, PA-C   17 g at 09/01/20 0730  . polyethylene glycol (MIRALAX / GLYCOLAX) packet 17 g  17 g Oral Daily PRN Bary Leriche, PA-C   17 g at 09/01/20 2104  . prochlorperazine (COMPAZINE) tablet 5-10 mg  5-10 mg Oral Q6H PRN Love, Pamela S, PA-C       Or  . prochlorperazine (COMPAZINE) injection 5-10 mg  5-10 mg Intramuscular Q6H PRN Love, Pamela S, PA-C       Or  . prochlorperazine (COMPAZINE) suppository 12.5 mg  12.5 mg Rectal Q6H PRN Love, Pamela S, PA-C      . protein supplement (ENSURE MAX) liquid  11 oz Oral Daily Meredith Staggers, MD   11 oz at 09/02/20 0915  . QUEtiapine (SEROQUEL) tablet 25 mg  25 mg Oral BID Meredith Staggers, MD   25 mg at 09/02/20 1222  . senna-docusate (Senokot-S) tablet 2 tablet  2 tablet Oral BID Bary Leriche, PA-C   2 tablet at 09/02/20 1222  . topiramate (TOPAMAX) tablet 50 mg  50 mg Oral QHS Raulkar, Clide Deutscher, MD   50 mg at 09/01/20 2058  . traZODone (DESYREL) tablet 50 mg  50 mg Oral QHS Meredith Staggers, MD        REVIEW OF SYSTEMS:   Constitutional: Denies fevers, chills or abnormal weight loss Eyes: Denies blurriness of vision Ears, nose, mouth, throat, and face: Denies mucositis or sore throat Respiratory: Denies cough, dyspnea or wheezes Cardiovascular: Denies palpitation, chest discomfort or lower extremity swelling Gastrointestinal:  Denies nausea, constipation, diarrhea GU: Denies dysuria or incontinence Skin: Denies abnormal skin rashes Neurological:  Per HPI Musculoskeletal: Denies joint pain, back or neck discomfort. No decrease in ROM Behavioral/Psych: Denies anxiety, disturbance in thought content, and mood instability   PHYSICAL EXAMINATION: Vitals:   09/02/20 0624 09/02/20 1530  BP: 113/69 131/81  Pulse: (!) 57 70  Resp: 16  14  Temp: 97.8 F (36.6 C) 97.8 F (36.6 C)  SpO2: 99% 97%   KPS: 60. General: Alert, cooperative, pleasant, in no acute distress Head: Craniotomy scar noted, dry and intact. EENT: No conjunctival injection or scleral icterus. Oral mucosa moist Lungs: Resp effort normal Cardiac: Regular rate and rhythm Abdomen: Soft, non-distended abdomen Skin: No rashes cyanosis or petechiae. Extremities: No clubbing or edema  NEUROLOGIC EXAM: Mental Status: Awake, alert, attentive to examiner. Oriented to self and environment. Language is fluent with intact comprehension.  Age advanced psychomotor slowing.  Impaired short term memory. Cranial Nerves: Visual acuity is grossly normal. Visual fields are full. Extra-ocular movements intact. No ptosis. Face is symmetric, tongue midline. Motor: Tone and bulk are normal. Dense plegia left arm and leg. Reflexes are symmetric, no pathologic reflexes present. Intact finger to nose bilaterally Sensory: Intact to light touch and temperature Gait: Deferred   LABORATORY DATA:  I have reviewed the data as listed Lab Results  Component Value Date   WBC 15.8 (H) 09/02/2020   HGB 9.8 (L) 09/02/2020   HCT 30.0 (L) 09/02/2020   MCV 96.8 09/02/2020   PLT 291 09/02/2020   Recent Labs    08/17/20 1046 08/28/20 0454 08/30/20 0624 09/02/20 0814  NA 141 134* 133* 135  K 3.7 4.3 3.8 3.6  CL 106 100 100 103  CO2 25 27 26 25   GLUCOSE 111* 118* 111* 129*  BUN 12 19 21* 18  CREATININE 0.91 0.73 0.77 0.83  CALCIUM 9.1 8.2* 8.5* 8.6*  GFRNONAA >60 >60 >60 >60  PROT 7.7 5.0*  --   --   ALBUMIN 4.2 2.6*  --   --   AST 19 20  --   --   ALT 17 27  --   --   ALKPHOS 77 34*  --    --   BILITOT 1.0 0.7  --   --     RADIOGRAPHIC STUDIES: I have personally reviewed the radiological images as listed and agreed with the findings in the report. CT HEAD WO CONTRAST  Addendum Date: 08/30/2020   ADDENDUM REPORT: 08/30/2020 21:46 ADDENDUM: These results were called by telephone at the time of interpretation on 08/30/2020 at 9:46 pm to provider PA [place], who verbally acknowledged these results. Electronically Signed   By: Kellie Simmering DO   On: 08/30/2020 21:46   Result Date: 08/30/2020 CLINICAL DATA:  Mental status change, unknown cause; headaches, visual changes-new, GBM with surgery at [facility]. EXAM: CT HEAD WITHOUT CONTRAST TECHNIQUE: Contiguous axial images were obtained from the base of the skull through the vertex without intravenous contrast. COMPARISON:  Prior brain MRI 08/17/2020. FINDINGS: Brain: Cerebral volume is normal for age. Since the prior MRI of 08/17/2020, there has been interval right-sided craniotomy/cranioplasty for resection of a right cerebral mass. Also new from this prior exam, there is a cystic focus within the right frontoparietal lobes extending inferiorly toward the right basal ganglia and thalamus measuring 4.2 x 4.8 x 4.6 cm (AP x TV x CC) compatible with a resection cavity. Persistent fairly extensive predominantly white matter hypoattenuation within the surrounding right frontoparietal lobes extending into the white matter tracks inferiorly and also into the right temporal lobe. Findings may reflect edema and/or infiltrative tumor. Also new from the prior exam, there is a mixed attenuation extra-axial fluid collection overlying the right frontoparietal lobes measuring up to 8 mm in greatest thickness. There are hyperdense components within the collection posteriorly, compatible with a small amount of acute/subacute  hemorrhage (for instance as seen on series 5, image 53). Persistent mass effect with partial effacement of the right lateral ventricle. 6 mm  leftward midline shift measured at the level of the septum pellucidum (previously 7 mm). No definite demarcated cortical infarct. Vascular: No hyperdense vessel.  Atherosclerotic calcifications. Skull: Interval right craniotomy/cranioplasty. No calvarial fracture. Sinuses/Orbits: Visualized orbits show no acute finding. Mild mucosal thickening within the inferior left maxillary sinus. Trace bilateral ethmoid sinus mucosal thickening. Other: Scalp fluid collection overlying the cranioplasty measuring up to 15 mm in thickness (for instance as seen on series 3, image 20). IMPRESSION: There has been interval, presumed partial, resection of a tumor centered within the deep right frontoparietal region. 4.2 x 4.8 x 4.6 cm resection cavity at this site. Persistent fairly extensive predominantly white matter hypoattenuation within the surrounding frontoparietal lobes extending inferiorly and also into the right temporal lobe. This likely reflects a combination of edema and residual infiltrative tumor. Persistent mass effect with partial effacement of the right lateral ventricle. 6 mm leftward midline shift (previously 7 mm). New mixed attenuation extra-axial collection overlying the right frontoparietal lobes measuring up to 8 mm in greatest thickness. Small amount of acute/early subacute hemorrhage within the collection posteriorly. Scalp fluid collection overlying the cranioplasty measuring up to 15 mm in thickness. Mild paranasal sinus disease as described. Electronically Signed: By: Kellie Simmering DO On: 08/30/2020 20:07   CT Head Wo Contrast  Result Date: 08/17/2020 CLINICAL DATA:  Recent falls. EXAM: CT HEAD WITHOUT CONTRAST CT CERVICAL SPINE WITHOUT CONTRAST TECHNIQUE: Multidetector CT imaging of the head and cervical spine was performed following the standard protocol without intravenous contrast. Multiplanar CT image reconstructions of the cervical spine were also generated. COMPARISON:  None. FINDINGS: CT HEAD  FINDINGS Brain: Large mass lesion in the right posterior frontal lobe measuring approximately 4.1 x 4.6 cm. Large amount of surrounding vasogenic edema in the white matter. Local mass-effect on the right lateral ventricle. 6 mm midline shift to the left. The mass is approximately isodense to brain. No definite hemorrhage. No second lesion identified. Ventricle size not enlarged. Vascular: Negative for hyperdense vessel Skull: Negative Sinuses/Orbits: Mucosal edema paranasal sinuses.  Negative orbit Other: None CT CERVICAL SPINE FINDINGS Alignment: Normal Skull base and vertebrae: Negative for fracture or mass lesion. Soft tissues and spinal canal: Negative for soft tissue mass Disc levels: Disc degeneration and spurring at C5-6 with foraminal and spinal stenosis due to spurring. Upper chest: Lung apices clear bilaterally. Other: None IMPRESSION: 1. Large mass lesion in the right posterior frontal lobe with extensive surrounding edema. There is mass-effect and 6 mm midline shift. No hemorrhage. 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 Recommend MRI brain without with contrast 2. Negative for cervical fracture. Cervical spondylosis most notably at C5-6. 3. These results were called by telephone at the time of interpretation on 08/17/2020 at 11:29 am to provider MARK QUALE , who verbally acknowledged these results. Electronically Signed   By: Franchot Gallo M.D.   On: 08/17/2020 11:31   CT Cervical Spine Wo Contrast  Result Date: 08/17/2020 CLINICAL DATA:  Recent falls. EXAM: CT HEAD WITHOUT CONTRAST CT CERVICAL SPINE WITHOUT CONTRAST TECHNIQUE: Multidetector CT imaging of the head and cervical spine was performed following the standard protocol without intravenous contrast. Multiplanar CT image reconstructions of the cervical spine were also generated. COMPARISON:  None. FINDINGS: CT HEAD FINDINGS Brain: Large mass lesion in the right posterior frontal lobe measuring  approximately 4.1 x 4.6  cm. Large amount of surrounding vasogenic edema in the white matter. Local mass-effect on the right lateral ventricle. 6 mm midline shift to the left. The mass is approximately isodense to brain. No definite hemorrhage. No second lesion identified. Ventricle size not enlarged. Vascular: Negative for hyperdense vessel Skull: Negative Sinuses/Orbits: Mucosal edema paranasal sinuses.  Negative orbit Other: None CT CERVICAL SPINE FINDINGS Alignment: Normal Skull base and vertebrae: Negative for fracture or mass lesion. Soft tissues and spinal canal: Negative for soft tissue mass Disc levels: Disc degeneration and spurring at C5-6 with foraminal and spinal stenosis due to spurring. Upper chest: Lung apices clear bilaterally. Other: None IMPRESSION: 1. Large mass lesion in the right posterior frontal lobe with extensive surrounding edema. There is mass-effect and 6 mm midline shift. No hemorrhage. 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 Recommend MRI brain without with contrast 2. Negative for cervical fracture. Cervical spondylosis most notably at C5-6. 3. These results were called by telephone at the time of interpretation on 08/17/2020 at 11:29 am to provider MARK QUALE , who verbally acknowledged these results. Electronically Signed   By: Franchot Gallo M.D.   On: 08/17/2020 11:31   MR Brain W and Wo Contrast  Result Date: 08/17/2020 CLINICAL DATA:  Brain mass or lesion. EXAM: MRI HEAD WITHOUT AND WITH CONTRAST TECHNIQUE: Multiplanar, multiecho pulse sequences of the brain and surrounding structures were obtained without and with intravenous contrast. CONTRAST:  55mL GADAVIST GADOBUTROL 1 MMOL/ML IV SOLN COMPARISON:  Head CT August 17, 2020. FINDINGS: Brain: Large heterogeneous mass lesion centered in the deep right frontoparietal region extending into the right basal ganglia region. The lesion is multilobulated with central necrosis and peripheral  irregular contrast enhancement and measures approximately 4 x 3.8 x 3.6 cm (AP, T, cc). Foci of restricted diffusion within the tumor suggests hypercellularity. There is prominent surrounding T2 hyperintensity consistent with vasogenic edema with possible tumor extension. T2 hyperintensity extending inferiorly along the cortical spinal tract in the posterior limb of internal capsule and right cerebral peduncle. There is mass effect with effacement of the adjacent cerebral sulci, partial effacement of the left lateral ventricle and a 7 mm leftward midline shift. No acute infarct, hydrocephalus or extra-axial collection. Vascular: Normal flow voids. Skull and upper cervical spine: Normal marrow signal. Sinuses/Orbits: Prominence of the optic nerve sheath with flattening of the optic discs suggestive of increased intracranial pressure. Mild mucosal thickening of the bilateral maxillary sinuses. Other: Mild bilateral mastoid effusion. IMPRESSION: 1. Large heterogeneous mass lesion centered in the deep right frontoparietal region extending into the right basal ganglia region, measuring approximately 4 x 3.8 x 3.6 cm (AP, T, cc). Finding are concerning for primary high-grade neoplasm versus metastasis. 2. Prominent surrounding vasogenic edema with possible tumor extension causing effacement of the adjacent cerebral sulci, partial effacement of the left lateral ventricle and a 7 mm leftward midline shift. 3. Prominence of the optic nerve sheath with flattening of the optic discs suggestive of increased intracranial pressure. Electronically Signed   By: Pedro Earls M.D.   On: 08/17/2020 13:03   IR IVC FILTER PLMT / S&I [place] Keels GUID/MOD SED  Result Date: 08/29/2020 CLINICAL DATA:  DVT. Recent intracranial surgery, a relative contraindication to anticoagulation. Caval filtration requested. EXAM: INFERIOR VENACAVOGRAM IVC FILTER PLACEMENT UNDER FLUOROSCOPY FLUOROSCOPY TIME:  1 minutes 24 seconds; 121 mGy  TECHNIQUE: Patency of the right IJ vein was confirmed with ultrasound with image documentation. An appropriate skin site was determined. Skin  site was marked, prepped with chlorhexidine, and draped using maximum barrier technique. The region was infiltrated locally with 1% lidocaine. Intravenous Fentanyl 59mcg and Versed 1mg  were administered as conscious sedation during continuous monitoring of the patient's level of consciousness and physiological / cardiorespiratory status by the radiology RN, with a total moderate sedation time of 42 minutes. Under real-time ultrasound guidance, the right IJ vein was accessed with a 21 gauge micropuncture needle; the needle tip within the vein was confirmed with ultrasound image documentation. The needle was exchanged over a 018 guidewire for a transitional dilator, which allow advancement of the [facility] wire into the IVC. A long 6 French vascular sheath was placed for inferior venacavography. This demonstrated no caval thrombus. Renal vein inflows were evident. The [facility] IVC filter was advanced through the sheath and successfully deployed under fluoroscopy at the L2 level. Followup cavagram demonstrates stable filter position and no evident complication. The sheath was removed and hemostasis achieved at the site. No immediate complication. IMPRESSION: 1. Normal IVC. No thrombus or significant anatomic variation. 2. Technically successful infrarenal IVC filter placement. This is a retrievable model. PLAN: This IVC filter is potentially retrievable. The patient will be assessed for filter retrieval by Interventional Radiology in approximately 8-12 weeks. Further recommendations regarding filter retrieval, continued surveillance or declaration of device permanence, will be made at that time. Electronically Signed   By: Lucrezia Europe M.D.   On: 08/29/2020 13:59   VAS [place] LOWER EXTREMITY VENOUS (DVT)  Result Date: 08/29/2020  Lower Venous DVT Study Indications: Edema.  Risk Factors:  None identified. Limitations: Body habitus and poor ultrasound/tissue interface. Comparison Study: No prior studies. Performing Technologist: Oliver Hum RVT  Examination Guidelines: A complete evaluation includes B-mode imaging, spectral Doppler, color Doppler, and power Doppler as needed of all accessible portions of each vessel. Bilateral testing is considered an integral part of a complete examination. Limited examinations for reoccurring indications may be performed as noted. The reflux portion of the exam is performed with the patient in reverse Trendelenburg.  +---------+---------------+---------+-----------+----------+--------------+ RIGHT    CompressibilityPhasicitySpontaneityPropertiesThrombus Aging +---------+---------------+---------+-----------+----------+--------------+ CFV      Full           Yes      Yes                                 +---------+---------------+---------+-----------+----------+--------------+ SFJ      Full                                                        +---------+---------------+---------+-----------+----------+--------------+ FV Prox  Full                                                        +---------+---------------+---------+-----------+----------+--------------+ FV Mid   Full                                                        +---------+---------------+---------+-----------+----------+--------------+ FV DistalFull                                                        +---------+---------------+---------+-----------+----------+--------------+  PFV      Full                                                        +---------+---------------+---------+-----------+----------+--------------+ POP      Full           Yes      Yes                                 +---------+---------------+---------+-----------+----------+--------------+ PTV      Full                                                         +---------+---------------+---------+-----------+----------+--------------+ PERO     Full                                                        +---------+---------------+---------+-----------+----------+--------------+   +---------+---------------+---------+-----------+----------+--------------+ LEFT     CompressibilityPhasicitySpontaneityPropertiesThrombus Aging +---------+---------------+---------+-----------+----------+--------------+ CFV      Full           Yes      Yes                                 +---------+---------------+---------+-----------+----------+--------------+ SFJ      Full                                                        +---------+---------------+---------+-----------+----------+--------------+ FV Prox  Full                                                        +---------+---------------+---------+-----------+----------+--------------+ FV Mid   Full                                                        +---------+---------------+---------+-----------+----------+--------------+ FV DistalFull                                                        +---------+---------------+---------+-----------+----------+--------------+ PFV      Full                                                        +---------+---------------+---------+-----------+----------+--------------+  POP      Partial        No       No                   Acute          +---------+---------------+---------+-----------+----------+--------------+ PTV      Full                                                        +---------+---------------+---------+-----------+----------+--------------+ PERO     Partial                                      Acute          +---------+---------------+---------+-----------+----------+--------------+ Gastroc  Full                                                         +---------+---------------+---------+-----------+----------+--------------+     Summary: RIGHT: - There is no evidence of deep vein thrombosis in the lower extremity.  - No cystic structure found in the popliteal fossa.  LEFT: - Findings consistent with acute deep vein thrombosis involving the left popliteal vein, and left peroneal veins. - No cystic structure found in the popliteal fossa.  *See table(s) above for measurements and observations. Electronically signed by Ruta Hinds MD on 08/29/2020 at 11:13:43 AM.    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  From what we can tell, finalized pathology and molecular markers from [place] are still pending.  Quality of resection appears good from CT imaging, we do not have immediate post-op MRI for review at this time.  He continues to exhibit dense motor impairment despite aggressive rehab efforts.  Cognitive impairments are also apparent.   We would like to meet and discuss further with the patient and his family once discharged.  Follow up can be arranged for brain tumor clinic at [facility].  In the meantime, we will obtain complete records from [facility], including hard copies of relevant imaging and pathology.  Can decrease decadron to 2mg  daily once discharged.    Would con't Keppra 1000mg  BID for now.    All questions were answered. The patient knows to call the clinic with any problems, questions or concerns.  The total time spent in the encounter was 55 minutes and more than 50% was on counseling and review of test results     Ventura Sellers, MD 09/02/2020 4:34 PM

## 2020-09-02 NOTE — Plan of Care (Signed)
  Problem: Consults Goal: RH BRAIN INJURY PATIENT EDUCATION Description: Description: See Patient Education module for eduction specifics Outcome: Progressing   Problem: RH BOWEL ELIMINATION Goal: RH STG MANAGE BOWEL WITH ASSISTANCE Description: STG Manage Bowel with Assistance. Mod I Outcome: Progressing   Problem: RH BLADDER ELIMINATION Goal: RH STG MANAGE BLADDER WITH ASSISTANCE Description: STG Manage Bladder With Assistance. Mod I Outcome: Progressing   Problem: RH KNOWLEDGE DEFICIT BRAIN INJURY Goal: RH STG INCREASE KNOWLEDGE OF SELF CARE AFTER BRAIN INJURY Description: Patient will be able to demonstrate knowledge of medication management, pain management, skin/wound care management with educational materials and handouts provided by staff. Outcome: Progressing

## 2020-09-03 DIAGNOSIS — I82432 Acute embolism and thrombosis of left popliteal vein: Secondary | ICD-10-CM | POA: Diagnosis not present

## 2020-09-03 DIAGNOSIS — C719 Malignant neoplasm of brain, unspecified: Secondary | ICD-10-CM | POA: Diagnosis not present

## 2020-09-03 DIAGNOSIS — G441 Vascular headache, not elsewhere classified: Secondary | ICD-10-CM | POA: Diagnosis not present

## 2020-09-03 DIAGNOSIS — G8194 Hemiplegia, unspecified affecting left nondominant side: Secondary | ICD-10-CM | POA: Diagnosis not present

## 2020-09-03 LAB — GLUCOSE, CAPILLARY
Glucose-Capillary: 128 mg/dL — ABNORMAL HIGH (ref 70–99)
Glucose-Capillary: 99 mg/dL (ref 70–99)
Glucose-Capillary: 114 mg/dL — ABNORMAL HIGH (ref 70–99)
Glucose-Capillary: 109 mg/dL — ABNORMAL HIGH (ref 70–99)
Glucose-Capillary: 100 mg/dL — ABNORMAL HIGH (ref 70–99)

## 2020-09-03 MED ORDER — TRAZODONE HCL 50 MG PO TABS
100.0000 mg | ORAL_TABLET | Freq: Every day | ORAL | Status: DC
  Administered 2020-09-03 – 2020-09-15 (×13): 100 mg via ORAL
  Filled 2020-09-03 (×13): qty 2

## 2020-09-03 NOTE — Progress Notes (Signed)
Speech Language Pathology Weekly Progress and Session Note  Patient Details  Name: Todd Villa MRN: [id number] Date of Birth: [birth year]  Beginning of progress report period: August 27, 2020 End of progress report period: September 03, 2020  Today's Date: 09/03/2020 SLP Individual Time: 7681-1572 SLP Individual Time Calculation (min): 40 min  Short Term Goals: Week 1: SLP Short Term Goal 1 (Week 1): Patient will demonstrate recall for new, daily information with supervision level verbal cues. SLP Short Term Goal 1 - Progress (Week 1): Not met SLP Short Term Goal 2 (Week 1): Patient will demonstrate selective attention in a mildly distracting enviornment for ~10 minutes with min A verbal cues. SLP Short Term Goal 2 - Progress (Week 1): Not met SLP Short Term Goal 3 (Week 1): Patient will demonstrate functional problem solving for mildly complex tasks with Supervision level cues. SLP Short Term Goal 3 - Progress (Week 1): Not met SLP Short Term Goal 4 (Week 1): Pt will increase left field scanning to increase safety during functional activities (mobility, reading, writing, etc) provided mod A verbal and visual cues SLP Short Term Goal 4 - Progress (Week 1): Met SLP Short Term Goal 5 (Week 1): Pt will understand, recall and utilize compensatory strategies to increase speech intelligiblity with Supervision level cues. SLP Short Term Goal 5 - Progress (Week 1): Met    New Short Term Goals: Week 2: SLP Short Term Goal 1 (Week 2): Pt will increase left field scanning to increase safety during functional activities (mobility, reading, writing, etc) provided min A verbal and visual cues SLP Short Term Goal 2 (Week 2): Patient will demonstrate functional problem solving for basic and familiar tasks with Mod A verbal and visual cues. SLP Short Term Goal 3 (Week 2): Patient will demonstrate sustained attention to functional tasks for ~10 minutes with Mod verbal cues for redirection. SLP Short Term  Goal 4 (Week 2): Patient will orient to place, time and situation with Min verbal and visual cues. SLP Short Term Goal 5 (Week 2): Patient will identify 2 physical and 2 cognitive changes with Mod verbal cues.  Weekly Progress Updates: Patient has made minimal and inconsistent goals and has met 2 of 5 STGs this reporting period. Currently, patient requires overall Max A multimodal cues for sustained attention, functional problem solving, recall of functional information and awareness of deficits. Patient demonstrates mildly improved visual scanning to his left field of environment and is 100% intelligible. Patient's overall function and participation can be impacted by intermittent confusion, verbosity with perseveration on the same questions/topics and intermittent impulse to throw or knock everything within his reach into the floor. Patient and family education ongoing. Patient would benefit from continued skilled SLP intervention to maximize his cognitive functioning and overall functional independence prior to discharge.      Intensity: Minumum of 1-2 x/day, 30 to 90 minutes Frequency: 3 to 5 out of 7 days Duration/Length of Stay: 3 weeks Treatment/Interventions: Cognitive remediation/compensation;Functional tasks;Cueing hierarchy;Therapeutic Activities;Therapeutic Exercise;Medication managment;Patient/family education;Internal/external aids   Daily Session  Skilled Therapeutic Interventions: Skilled treatment session focused on cognitive goals. SLP facilitatd session by providing overall supervision level verbal cues for left visual scanning to locate specific items on his tray. Patient with decreased verbosity but was intermittently confused (despite being fully oriented) and perseverative on topics resulting in Mod verbal cues for redirection to self-feeding. Patient also intermittently labile throughout session. Patient left upriht in bed with alarm on and all needs within reach. Continue with  current plan  of care.       Pain No/Denies Pain   Therapy/Group: Individual Therapy  Langston Tuberville 09/03/2020, 6:43 AM

## 2020-09-03 NOTE — Plan of Care (Signed)
  Problem: Consults Goal: RH BRAIN INJURY PATIENT EDUCATION Description: Description: See Patient Education module for eduction specifics Outcome: Progressing   Problem: RH BOWEL ELIMINATION Goal: RH STG MANAGE BOWEL WITH ASSISTANCE Description: STG Manage Bowel with Assistance. Mod I Outcome: Progressing   Problem: RH BLADDER ELIMINATION Goal: RH STG MANAGE BLADDER WITH ASSISTANCE Description: STG Manage Bladder With Assistance. Mod I Outcome: Progressing   Problem: RH KNOWLEDGE DEFICIT BRAIN INJURY Goal: RH STG INCREASE KNOWLEDGE OF SELF CARE AFTER BRAIN INJURY Description: Patient will be able to demonstrate knowledge of medication management, pain management, skin/wound care management with educational materials and handouts provided by staff. Outcome: Progressing

## 2020-09-03 NOTE — Progress Notes (Addendum)
PROGRESS NOTE   Subjective/Complaints: Pt lying in bed. Says he didn't sleep that well. Has a dull headache still. Didn't recall visit with Dr. Mickeal Skinner yesterday. Asked me if "war had ended?" referring to [place]   ROS: Limited due to cognitive/behavioral    Objective:   No results found. Recent Labs    09/01/20 1000 09/02/20 0814  WBC 24.6* 15.8*  HGB 10.4* 9.8*  HCT 31.9* 30.0*  PLT 328 291   Recent Labs    09/02/20 0814  NA 135  K 3.6  CL 103  CO2 25  GLUCOSE 129*  BUN 18  CREATININE 0.83  CALCIUM 8.6*    Intake/Output Summary (Last 24 hours) at 09/03/2020 1117 Last data filed at 09/03/2020 0800 Gross per 24 hour  Intake 840 ml  Output --  Net 840 ml        Physical Exam: Vital Signs Blood pressure 101/70, pulse 63, temperature 97.8 F (36.6 C), temperature source Oral, resp. rate 16, height 6\' 3"  (1.905 m), weight (!) 147.5 kg, SpO2 98 %. Constitutional: No distress . Vital signs reviewed. HEENT: EOMI, oral membranes moist Neck: supple Cardiovascular: RRR without murmur. No JVD    Respiratory/Chest: CTA Bilaterally without wheezes or rales. Normal effort    GI/Abdomen: BS +, non-tender, non-distended Ext: no clubbing, cyanosis, or edema Psych: flat, calm, no lability or irritability today Skin: scalp incision cdi with sutures now out. Neuro: pt alert. Poor insight and awareness. Limited STM. Follows all simple commands.   Reflexes are 2+ on left. Mild flexor tone LUE is ongoing. Fine motor coordination is intact. No tremors. Motor function is grossly 5/5 RUE and RLE and 1+ LUE and LLE prox to trace/absent distally--no significant changes   Musculoskeletal: Full ROM, No pain with AROM or PROM in the neck, trunk, or extremities. Posture appropriate. Edema LUE trace as well as tr to 1+ pedal edema. Left heel cord remains tight.        Assessment/Plan: 1. Functional deficits which require 3+ hours per  day of interdisciplinary therapy in a comprehensive inpatient rehab setting.  Physiatrist is providing close team supervision and 24 hour management of active medical problems listed below.  Physiatrist and rehab team continue to assess barriers to discharge/monitor patient progress toward functional and medical goals  Care Tool:  Bathing    Body parts bathed by patient: Left arm,Chest,Abdomen,Front perineal area,Face   Body parts bathed by helper: Right arm,Buttocks,Right upper leg,Left upper leg,Right lower leg,Left lower leg     Bathing assist Assist Level: Dependent - Patient 0%     Upper Body Dressing/Undressing Upper body dressing   What is the patient wearing?: Pull over shirt    Upper body assist Assist Level: Minimal Assistance - Patient > 75%    Lower Body Dressing/Undressing Lower body dressing      What is the patient wearing?: Pants     Lower body assist Assist for lower body dressing: 2 Helpers (dep in stedy for STS with + 2 to power up)     Toileting Toileting    Toileting assist Assist for toileting: Total Assistance - Patient < 25%     Transfers Chair/bed transfer  Transfers assist  Chair/bed transfer activity did not occur: Safety/medical concerns  Chair/bed transfer assist level: Maximal Assistance - Patient 25 - 49%     Locomotion Ambulation   Ambulation assist   Ambulation activity did not occur: Safety/medical concerns          Walk 10 feet activity   Assist  Walk 10 feet activity did not occur: Safety/medical concerns        Walk 50 feet activity   Assist Walk 50 feet with 2 turns activity did not occur: Safety/medical concerns         Walk 150 feet activity   Assist Walk 150 feet activity did not occur: Safety/medical concerns         Walk 10 feet on uneven surface  activity   Assist Walk 10 feet on uneven surfaces activity did not occur: Safety/medical concerns         Wheelchair     Assist  Will patient use wheelchair at discharge?: Yes Type of Wheelchair: Manual Wheelchair activity did not occur: Safety/medical concerns         Wheelchair 50 feet with 2 turns activity    Assist    Wheelchair 50 feet with 2 turns activity did not occur: Safety/medical concerns       Wheelchair 150 feet activity     Assist  Wheelchair 150 feet activity did not occur: Safety/medical concerns       Blood pressure 101/70, pulse 63, temperature 97.8 F (36.6 C), temperature source Oral, resp. rate 16, height 6\' 3"  (1.905 m), weight (!) 147.5 kg, SpO2 98 %. Medical Problem List and Plan: 1.  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   -3/25 Dr. Mickeal Skinner saw pt/family yesterday regarding further care plan. He will see pt at [facility] brain [facility] clinic after discharge.  2.  Antithrombotics: -DVT/anticoagulation:  Pharmaceutical: Lovenox  -Dopplers 3/19 revealed thrombus in the left popliteal and peroneal veins. -IR has placed IVCF  -activity as tolerated             -antiplatelet therapy: N/A 3. Pain Management: Oxycodone prn. Will continue tylenol qid.  -continue HS topamax for headaches and to assist with sleep   -Increased topamax to 50mg .  4. Mood/sleep:               -antipsychotic agents: increased agitation/lability noted, occasional pseudobulbar behaviors also  3/25 -appears a bit calmer today. UCX negative  -continue trial of low dose seroquel 25mg  bid  -will increase trazodone back to 100mg  qhs, continue melatonin  -continue xanax for anxiety 0.5mg  bid prn  -continue paxil 20mg  daily  -spoke with Dr. Sima Matas who will see pt/family today or potentially Monday.  -decadron reduced  to 2mg  bid 5. Neuropsych: This patient is partially capable of making decisions on her own behalf. 6. Skin/Wound Care: Routine pressure relief measures.  7. Fluids/Electrolytes/Nutrition:  encourage PO  -protein supp for low albumin 8. Hypotension: Monitor BP tid--off Cozaar at this time.  9. ABLA due to UGIB s/p hemo clip: hgb 8.7 3/19  -recheck Monday  10 Leukocytosis:  WBC 19.0 at admission. Up to 36k now 31k 3/19  -likely related to steroids and DVT. No fever, s/s infection currently  -3/24 UA negative, ucx pending, wbcs' down to 15k    -tapering steroids 11. Cerebral edema: down to 6 mm shift             --given visual phenomena described, have increased decadron back to 4mg  q12  -CT findings appear to be what would be expected post-op 12. 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  13. Constipation: Resolved with augmentation of medications.             --reports good results after suppository.  14. Hyponatremia: Na 133, monitor weekly  -recheck Monday       LOS: 7 days A FACE TO FACE EVALUATION WAS PERFORMED  Meredith Staggers 09/03/2020, 11:17 AM

## 2020-09-03 NOTE — Progress Notes (Addendum)
Physical Therapy Session Note  Patient Details  Name: Todd Villa MRN: [id number] Date of Birth: 15-Apr-1971  Today's Date: 09/03/2020 PT Individual Time: 4193-7902 PT Individual Time Calculation (min): 71 min   Short Term Goals: Week 1:  PT Short Term Goal 1 (Week 1): Pt will perform bed mobility with max assist of 1. PT Short Term Goal 2 (Week 1): Pt will transfer to and from [facility] with max assist PT Short Term Goal 3 (Week 1): Pt will propel WC 1ft with min assist  Skilled Therapeutic Interventions/Progress Updates:     Pt received supine in bed and agrees to therapy. Reports headache. PT provides rest breaks as needed during session to manage pain. Pt performs rolling to the R with maxA and L with modA to done shorts in bed prior to further mobility. MaxA for supine to sit. ModA for sit to stand with Stedy and totalA for transfer to [facility]. Pt transported via [facility] outside for energy conservation. PT provides cues to attend to objects in L visual field to increase L sided attention. Pt becomes very emotionally labile during session, sobbing off and on and asking the same questions repeatedly, such as "How long have I been here?" and "When did you first meet me?" PT answers questions but pt continues to ask. Limited mobility outside due to emotional state. Pt noted to have been incontinent of urine so taken back inside for brief change. Pt performs sit to stand in [place] with modA and requires minA to maxA while in [place] to maintain midline positioning. PT provides totalA to doff brief, shirt, and shorts and perform pericare for pt. MaxA to don clean shirt and totalA for brief change. MaxA for sit to supine. Pt left supine in bed with alarm intact and all needs within reach.  Therapy Documentation Precautions:  Precautions Precautions: None Restrictions Weight Bearing Restrictions: Yes LUE Weight Bearing: Non weight bearing RLE Weight Bearing: Weight bearing as tolerated LLE Weight Bearing:  Non weight bearing   Therapy/Group: Individual Therapy  Breck Coons 09/03/2020, 4:27 PM

## 2020-09-03 NOTE — Progress Notes (Signed)
Physical Therapy Session Note  Patient Details  Name: MARLOW BERENGUER MRN: [id number] Date of Birth: [birth year]  Today's Date: 09/03/2020 PT Individual Time: 1131-1204 PT Individual Time Calculation (min): 33 min   Short Term Goals: Week 1:  PT Short Term Goal 1 (Week 1): Pt will perform bed mobility with max assist of 1. PT Short Term Goal 2 (Week 1): Pt will transfer to and from [facility] with max assist PT Short Term Goal 3 (Week 1): Pt will propel WC 20ft with min assist  Skilled Therapeutic Interventions/Progress Updates:  Patient supine in bed upon PT arrival. Patient alert and agreeable to PT session. Patient denied pain during session. Pt perseverates on "safe word" of therapist at beginning of session. Focus this session on bed mobility, transfers, and NMR. Resting hand/ wrist spint and PRAFO doffed and re-donned at end of session with Total A.   Therapeutic Activity: Bed Mobility: Patient instructed to perform supine --> sit and provided with instructions to begin with bringing LLE to EOB. Pt is able to follow vc and through use of trunk and hips, is able to bring LLE toward EOB with Min A. RLE to EOB with CGA. Mod A for UB with RUE pulling at therapist's hand.  Provided verbal cues for sequence and technique throughout. Return to supine at end of session performed with Pt laying UB down without instructions from therapist. Then vc provided for RLE to bed surface with supervision. Mod A for LLE.  Transfers: Patient attempted STS using STEDY with elevated bed surface. Pt unable to rise to stand. States he is tired. VC required for effort, positioning, technique.  Neuromuscular Re-ed: NMR facilitated during session with focus on sitting balance and LLE PNF. Pt guided in sitting balance challenge while seated EOB and reach to PT's hands moved to tagets between shoulder and mid shin heights to R side and crossbody to L side. Requires vc for attn to task and up to Mod A to maintain balance  intermittently.  Pt also guided in supine PNF including contract relax bilaterally for improved abd/ adduction of LLE in [place]. Guided in bridging x6 with pt able to perform bilaterally and clear both hips from bed surface. First rep performed with largest ROM and diminishes in quality. NMR performed for improvements in motor control and coordination, balance, sequencing, judgement, and self confidence/ efficacy in performing all aspects of mobility at highest level of independence.   Patient supine in bed at end of session with brakes locked, bed alarm set, and all needs within reach.  Therapy Documentation Precautions:  Precautions Precautions: None Restrictions Weight Bearing Restrictions: Yes LUE Weight Bearing: Non weight bearing RLE Weight Bearing: Weight bearing as tolerated LLE Weight Bearing: Non weight bearing   Therapy/Group: Individual Therapy  Alger Simons PT, DPT 09/03/2020, 7:12 PM

## 2020-09-03 NOTE — Plan of Care (Addendum)
Behavioral Plan   [place] Level:  NA (Confused and agitated)  Behavior to decrease/ eliminate: Throwing objects in room  Emotional Outbursts Intermittent aggression  Changes to environment:  Keep tray table out of reach Don't leave in [facility] Make sure call bell, cell phone, and room phone are within on R side  Interventions: Telesitter Bed Alarm Timed Toileting 4 Side Rails up Mats on floor   Recommendations for interactions with patient: Keep pictures of family close (patient likes to hold and look at them) Provide emotional support and redirection when pt is labile  Attendees:  Cherylynn Ridges, OT Weston Anna, SLP Tereasa Coop, PT

## 2020-09-03 NOTE — Progress Notes (Signed)
Occupational Therapy Weekly Progress Note  Patient Details  Name: Todd Villa MRN: [id number] Date of Birth: 1970-11-27  Beginning of progress report period: August 28, 2020 End of progress report period: September 03, 2020   Today's Date: 09/03/2020 OT Individual Time: 1300-1400 OT Individual Time Calculation (min): 60 min  and Today's Date: 09/03/2020 OT Missed Time: 15 Minutes Missed Time Reason: Other (comment) (agitation/safety)  Patient has met 2 of 4 short term goals.  Pt is making slow, but steady progress towards OT goals. Pt is still using the [facility] for transfers given L hemiplegia and weakness, requiring the use of the Stedy's knee blocking feature. He can pull into standing in [place] with mod A, then be transferred. Pt still requires max A for all BADL tasks. Pt has had some increased lability, agitation, and confusion this week with difficulty with new learning. Continue current POC at this time.   Patient continues to demonstrate the following deficits: muscle weakness, impaired timing and sequencing, abnormal tone, unbalanced muscle activation, motor apraxia, ataxia, decreased coordination and decreased motor planning, decreased midline orientation, decreased attention to left and decreased motor planning, decreased initiation, decreased attention, decreased awareness, decreased problem solving, decreased safety awareness, decreased memory and delayed processing and decreased sitting balance, decreased standing balance, decreased postural control, hemiplegia and decreased balance strategies and therefore will continue to benefit from skilled OT intervention to enhance overall performance with BADL and Reduce care partner burden.  Patient progressing toward long term goals..  Continue plan of care.  OT Short Term Goals Week 1:  OT Short Term Goal 1 (Week 1): Pt will sit EOB wiht MIN A for dynamic sitting balnace OT Short Term Goal 1 - Progress (Week 1): Met OT Short Term Goal 2  (Week 1): Pt will transfer wiht MOD A to BSC and LRAD OT Short Term Goal 2 - Progress (Week 1): Progressing toward goal OT Short Term Goal 3 (Week 1): Pt will locate all grooming items on L of sink OT Short Term Goal 3 - Progress (Week 1): Met OT Short Term Goal 4 (Week 1): Pt will don shirt wiht S OT Short Term Goal 4 - Progress (Week 1): Progressing toward goal Week 2:  OT Short Term Goal 1 (Week 2): Pt will transfer wiht MOD A to BSC and LRAD OT Short Term Goal 2 (Week 2): Pt will don shirt with min A OT Short Term Goal 3 (Week 2): Pt will complete one step of LB dressing task  Skilled Therapeutic Interventions/Progress Updates:    Patient greeted semi-reclined in bed and agreeable to OT treatment session. Pt had not eaten lunch yet and nurse tech was bringing in food. Worked on rolling L and R for total A peri-care and brief change at bed level. Max A to then transfer to sitting EOB. WOkred on sitting balance and reaching forward to try to don pants. OT started pants by dressing L LE, then he attempted to lean forward to thread R side, but needed OT assist. Sit<>stand with Stedy from raised bed with mod/max A and 3 trials as pt initially would not power up. Pt transferred to wc in [place]. Worked on self-feeding and locating food items on L side of plate. Pt with difficulty managing rice on feeding utensil with OT suggesting pt try using spoon to scoop food items. Pt continued to have bouts of lability throughout session and often asking random questions off topic that did not make sense. Pt brought to the therapy gym  to high-low table with OT having plans of working on L UE. Pt all of a sudden grabbed ring arch activity that happened to also be sitting on the table at forcefully threw it across the room. Pt then angrily and forcefully pushed himself away from the table. Pt visibly agitated. OT tried to redirect pt and his agitation and suggested we return to the room-pt agreed. Pt inappropriately  touching therapists head and grabbing OT's badge when OT trying to place [place]. Sit<>stand in [place] with max A this time to power up. Pt returned to bed and left semi-reclined in bed with bed alarm on, call bell in reach, and needs met. Upon OT exit, pt stated "Have you ever met your maker?"    Therapy Documentation Precautions:  Precautions Precautions: None Restrictions Weight Bearing Restrictions: Yes LUE Weight Bearing: Non weight bearing RLE Weight Bearing: Weight bearing as tolerated LLE Weight Bearing: Non weight bearing Pain:  denies pain  Therapy/Group: Individual Therapy  Valma Cava 09/03/2020, 10:04 AM

## 2020-09-03 NOTE — Progress Notes (Incomplete)
Physical Therapy Session Note  Patient Details  Name: Todd Villa MRN: [id number] Date of Birth: 1970/11/03  Today's Date: 09/03/2020 PT Individual Time: 1131-1204 PT Individual Time Calculation (min): 33 min   Short Term Goals: Week 1:  PT Short Term Goal 1 (Week 1): Pt will perform bed mobility with max assist of 1. PT Short Term Goal 2 (Week 1): Pt will transfer to and from [facility] with max assist PT Short Term Goal 3 (Week 1): Pt will propel WC 63ft with min assist  Skilled Therapeutic Interventions/Progress Updates:  Patient supine in bed upon PT arrival. Patient alert and agreeable to PT session. Patient denied pain during session. Pt perseverates on "safe word" of therapist at beginning of session. Focus this session on bed mobility, transfers, and NMR.   Therapeutic Activity: Bed Mobility: Patient instructed to perform supine --> sit and provided with instructions to begin with bringing LLE to EOB. Pt is able to follow vc and through use of trunk and hips, is able to bring LLE toward EOB with Min A. RLE to EOB with CGA. Mod A for UB with RUE pulling at therapist's hand.  Provided verbal cues for ***. Transfers: Patient performed sit to/from stand x*** with ***. Provided verbal cues for***.    Neuromuscular Re-ed: NMR facilitated during session with focus on sitting balance and LLE PNF. Pt guided in sitting balance challenge while seated EOB and reach to PT's hands moved to tagets between shoulder and mid shin heights to R side and crossbody to L side. Requires vc for attn to task and up to Mod A to maintain balance intermittently.  Pt also guided in supine PNF including contract relax NMR performed for improvements in motor control and coordination, balance, sequencing, judgement, and self confidence/ efficacy in performing all aspects of mobility at highest level of independence.   Therapeutic Exercise: Patient performed the following exercises with verbal and tactile cues for  proper technique. ***  Patient ***  in *** at end of session with brakes locked, *** alarm set, and all needs within reach.    Therapy Documentation Precautions:  Precautions Precautions: None Restrictions Weight Bearing Restrictions: Yes LUE Weight Bearing: Non weight bearing RLE Weight Bearing: Weight bearing as tolerated LLE Weight Bearing: Non weight bearing   Therapy/Group: Individual Therapy  Alger Simons PT, DPT 09/03/2020, 7:12 PM

## 2020-09-03 NOTE — Progress Notes (Signed)
Patient ID: ZYEIR DYMEK, male   DOB: 01-May-1971, 50 y.o.   MRN: [id number]  SW received updates from Dr. Sima Matas request for wife to be present for neuropsych visit per attending. Pt on schedule for Monday (3/28). SW called pt wife Maudie Mercury [phone]) to inform, and encouraged her to speak with nursing staff about schedule on Sunday to see what time he is scheduled for the Monday visit. She will make an effort to be present.   Loralee Pacas, MSW, [place] Office: [phone] Cell: [phone] Fax: [phone]

## 2020-09-04 DIAGNOSIS — I82432 Acute embolism and thrombosis of left popliteal vein: Secondary | ICD-10-CM | POA: Diagnosis not present

## 2020-09-04 DIAGNOSIS — G8194 Hemiplegia, unspecified affecting left nondominant side: Secondary | ICD-10-CM | POA: Diagnosis not present

## 2020-09-04 DIAGNOSIS — C719 Malignant neoplasm of brain, unspecified: Secondary | ICD-10-CM | POA: Diagnosis not present

## 2020-09-04 DIAGNOSIS — R569 Unspecified convulsions: Secondary | ICD-10-CM

## 2020-09-04 DIAGNOSIS — G479 Sleep disorder, unspecified: Secondary | ICD-10-CM | POA: Diagnosis not present

## 2020-09-04 DIAGNOSIS — E871 Hypo-osmolality and hyponatremia: Secondary | ICD-10-CM

## 2020-09-04 LAB — GLUCOSE, CAPILLARY
Glucose-Capillary: 85 mg/dL (ref 70–99)
Glucose-Capillary: 112 mg/dL — ABNORMAL HIGH (ref 70–99)
Glucose-Capillary: 131 mg/dL — ABNORMAL HIGH (ref 70–99)
Glucose-Capillary: 111 mg/dL — ABNORMAL HIGH (ref 70–99)

## 2020-09-04 NOTE — Progress Notes (Addendum)
PROGRESS NOTE   Subjective/Complaints: Patient seen laying in bed this AM.  He indicated he slept well overnight.  He slept fairly well per sleep chart.  His WHO does not appear to have been donned overnight.  ROS: Limited due to cognition.  Objective:   No results found. Recent Labs    09/02/20 0814  WBC 15.8*  HGB 9.8*  HCT 30.0*  PLT 291   Recent Labs    09/02/20 0814  NA 135  K 3.6  CL 103  CO2 25  GLUCOSE 129*  BUN 18  CREATININE 0.83  CALCIUM 8.6*    Intake/Output Summary (Last 24 hours) at 09/04/2020 1016 Last data filed at 09/04/2020 0849 Gross per 24 hour  Intake 660 ml  Output 1575 ml  Net -915 ml        Physical Exam: Vital Signs Blood pressure 106/67, pulse (!) 59, temperature 97.7 F (36.5 C), temperature source Oral, resp. rate 16, height 6\' 3"  (1.905 m), weight (!) 147.5 kg, SpO2 98 %. Constitutional: No distress . Vital signs reviewed. HENT: Normocephalic.  Atraumatic. Eyes: EOMI. No discharge. Cardiovascular: No JVD.  RRR. Respiratory: Normal effort.  No stridor.  Bilateral clear to auscultation. GI: Non-distended.  BS +. Skin: Warm and dry.  Intact. Psych: Flat.  Delayed. Musc: Mild left upper extremity edema.  No tenderness in extremities. Neuro: Alert Motor: LUE/LE: 0/5 proximal distal   Assessment/Plan: 1. Functional deficits which require 3+ hours per day of interdisciplinary therapy in a comprehensive inpatient rehab setting.  Physiatrist is providing close team supervision and 24 hour management of active medical problems listed below.  Physiatrist and rehab team continue to assess barriers to discharge/monitor patient progress toward functional and medical goals  Care Tool:  Bathing    Body parts bathed by patient: Left arm,Chest,Abdomen,Front perineal area,Face   Body parts bathed by helper: Right arm,Buttocks,Right upper leg,Left upper leg,Right lower leg,Left lower  leg     Bathing assist Assist Level: Dependent - Patient 0%     Upper Body Dressing/Undressing Upper body dressing   What is the patient wearing?: Pull over shirt    Upper body assist Assist Level: Minimal Assistance - Patient > 75%    Lower Body Dressing/Undressing Lower body dressing      What is the patient wearing?: Pants     Lower body assist Assist for lower body dressing: 2 Helpers (dep in stedy for STS with + 2 to power up)     Toileting Toileting    Toileting assist Assist for toileting: Total Assistance - Patient < 25%     Transfers Chair/bed transfer  Transfers assist  Chair/bed transfer activity did not occur: Safety/medical concerns  Chair/bed transfer assist level: Total Assistance - Patient < 25% (Stedy)     Locomotion Ambulation   Ambulation assist   Ambulation activity did not occur: Safety/medical concerns          Walk 10 feet activity   Assist  Walk 10 feet activity did not occur: Safety/medical concerns        Walk 50 feet activity   Assist Walk 50 feet with 2 turns activity did not occur: Safety/medical concerns  Walk 150 feet activity   Assist Walk 150 feet activity did not occur: Safety/medical concerns         Walk 10 feet on uneven surface  activity   Assist Walk 10 feet on uneven surfaces activity did not occur: Safety/medical concerns         Wheelchair     Assist Will patient use wheelchair at discharge?: Yes Type of Wheelchair: Manual Wheelchair activity did not occur: Safety/medical concerns         Wheelchair 50 feet with 2 turns activity    Assist    Wheelchair 50 feet with 2 turns activity did not occur: Safety/medical concerns       Wheelchair 150 feet activity     Assist  Wheelchair 150 feet activity did not occur: Safety/medical concerns       Blood pressure 106/67, pulse (!) 59, temperature 97.7 F (36.5 C), temperature source Oral, resp. rate 16,  height 6\' 3"  (1.905 m), weight (!) 147.5 kg, SpO2 98 %. Medical Problem List and Plan: 1.  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   -3/25 Dr. Mickeal Skinner saw pt/family yesterday regarding further care plan. He will see pt at [facility] brain tumor clinic after discharge.   Continue CIR 2.  Antithrombotics: -DVT/anticoagulation:  Pharmaceutical: Lovenox  -Dopplers 3/19 revealed thrombus in the left popliteal and peroneal veins. -IR has placed IVCF  -activity as tolerated             -antiplatelet therapy: N/A 3. Pain Management: Oxycodone prn. Will continue tylenol qid.  -continue HS topamax for headaches and to assist with sleep   -Increased topamax to 50mg .  4. Mood/sleep:    continue low dose seroquel 25mg  bid  Continue trazodone back to 100mg  qhs, continue melatonin  -continue xanax for anxiety 0.5mg  bid prn  -continue paxil 20mg  daily  Neuropsych to eval 5. Neuropsych: This patient is partially capable of making decisions on her own behalf.  Telesitter for safety 6. Skin/Wound Care: Routine pressure relief measures.  7. Fluids/Electrolytes/Nutrition:  encourage PO  -protein supp for low albumin 8. Hypotension: Monitor BP tid--off Cozaar at this time.   Relatively controlled on 3/26 9. ABLA due to UGIB s/p hemo clip:   Hemoglobin 9.8 on 3/24  Continue to monitor 10 Leukocytosis:   -likely related to steroids and DVT. No fever, s/s infection currently  WBCs 15.8 on 3/24, continue to monitor  Afebrile  -3/24 UA negative, ucx with insignificant growth   -tapering steroids 11. Cerebral edema:   -CT findings appear to be what would be expected post-op  On steroids 12. 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   No breakthrough seizures noted 13. Constipation: Resolved with augmentation of medications.             --reports good results  after suppository.  14. Hyponatremia:   Sodium 135 on 3/24, labs ordered for Monday  LOS: 8 days A FACE TO FACE EVALUATION WAS PERFORMED  Johnryan Sao Lorie Phenix 09/04/2020, 10:16 AM

## 2020-09-04 NOTE — Progress Notes (Signed)
Pt noted with some agitation/anxiety at hs. Repetitively asking this Probation officer if he has hurt me. Pt noted with some preservation on arm bands requesting that this writer take off his arm right now. Able to be redirected. Pt took meds and then threw cup across room. Given prn xanax and oxy requested for headache, with positive effects. Pt slept for 8 hours this shift. Awakening briefly throughout the night. No agitation noted throughout the night.

## 2020-09-04 NOTE — Progress Notes (Signed)
Occupational Therapy Session Note  Patient Details  Name: Todd Villa MRN: [id number] Date of Birth: 1970-08-25  Today's Date: 09/04/2020 OT Individual Time: 1829-9371 OT Individual Time Calculation (min): 60 min    Short Term Goals: Week 2:  OT Short Term Goal 1 (Week 2): Pt will transfer wiht MOD A to BSC and LRAD OT Short Term Goal 2 (Week 2): Pt will don shirt with min A OT Short Term Goal 3 (Week 2): Pt will complete one step of LB dressing task  Skilled Therapeutic Interventions/Progress Updates:    Pt greeted semi-reclined in bed asleep, easy to wake, and agreeable to OT treatment session. Pt much calmer this morning with less labile/emotional outbursts and no aggressive behaviors during session. Worked on LB bathing and dressing at bed level this am. Pt able to roll to the R with min A while OT washed back and buttocks and donned new brief- condom catheter left on. Pt followed commands to lift R LE into pant leg after OT thread L LE. Worked on hip bridges to pull pants up with OT positioning and supporting LLE. Pt was able to bridge hips all the way off the bed to assist with getting up pants. Pt completed 4 sets of 5 hip bridges for hip strengtening. Pt then brought to EOB with max A for bed mobility. Worked on sitting balance at EOB while completing UB bathing tasks. Hand over hand A to integrate L UE for neuro re-ed. Pt demonstrated improved L body awareness and picked up L UE without cues to help thread through shirt. Pt was then able to sequence the rest of UB dressing task and just needed OT assist to get shirt overhead to protect incision. Worked on lateral scooting at EOB with pt demonstrating good head/hips relationship to scoot to the R. OT obtained slidebaord and demonstrated slideboard transfer with pt. No time to practice today, but will leave in room to try Monday. Pt returned to bed with max A and nurse tech entered to assist with breakfast.   Therapy  Documentation Precautions:  Precautions Precautions: None Restrictions Weight Bearing Restrictions: Yes LUE Weight Bearing: Non weight bearing RLE Weight Bearing: Weight bearing as tolerated LLE Weight Bearing: Non weight bearing Pain:   Pt reports headache, but no number given. Rest and repositioned for comfort.   Therapy/Group: Individual Therapy  Valma Cava 09/04/2020, 8:26 AM

## 2020-09-05 DIAGNOSIS — C719 Malignant neoplasm of brain, unspecified: Secondary | ICD-10-CM | POA: Diagnosis not present

## 2020-09-05 DIAGNOSIS — I82432 Acute embolism and thrombosis of left popliteal vein: Secondary | ICD-10-CM | POA: Diagnosis not present

## 2020-09-05 DIAGNOSIS — K5901 Slow transit constipation: Secondary | ICD-10-CM

## 2020-09-05 DIAGNOSIS — G8194 Hemiplegia, unspecified affecting left nondominant side: Secondary | ICD-10-CM | POA: Diagnosis not present

## 2020-09-05 DIAGNOSIS — E871 Hypo-osmolality and hyponatremia: Secondary | ICD-10-CM | POA: Diagnosis not present

## 2020-09-05 LAB — GLUCOSE, CAPILLARY
Glucose-Capillary: 100 mg/dL — ABNORMAL HIGH (ref 70–99)
Glucose-Capillary: 88 mg/dL (ref 70–99)
Glucose-Capillary: 122 mg/dL — ABNORMAL HIGH (ref 70–99)
Glucose-Capillary: 117 mg/dL — ABNORMAL HIGH (ref 70–99)

## 2020-09-05 NOTE — Progress Notes (Signed)
Patient slept for 4 hours this shift. Pt noted calling staff to come to his room stating that his grandmother is coming today so we need to make sure she gets here and he is ready. Pt states "I am the messiah you know me" to this Probation officer. Becoming increasingly anxious with repetitive statements. Xanax prn given, will monitor for effects.

## 2020-09-05 NOTE — Progress Notes (Addendum)
PROGRESS NOTE   Subjective/Complaints: Patient seen sitting up in bed this morning.  He states he slept fairly well overnight, confirmed with sleep chart.  He states today is very Mother's Day, unaware of holiday, however this appears to be in July for early March based on country.  He states he is constipated.  ROS: Limited due to cognition.  Objective:   No results found. No results for input(s): WBC, HGB, HCT, PLT in the last 72 hours. No results for input(s): NA, K, CL, CO2, GLUCOSE, BUN, CREATININE, CALCIUM in the last 72 hours.  Intake/Output Summary (Last 24 hours) at 09/05/2020 1745 Last data filed at 09/05/2020 1655 Gross per 24 hour  Intake 415 ml  Output 3950 ml  Net -3535 ml        Physical Exam: Vital Signs Blood pressure 103/82, pulse 84, temperature (!) 97.4 F (36.3 C), temperature source Oral, resp. rate 16, height 6\' 3"  (1.905 m), weight (!) 147.5 kg, SpO2 97 %. Constitutional: No distress . Vital signs reviewed. HENT: Normocephalic.  Right crani site site. Eyes: EOMI. No discharge. Cardiovascular: No JVD.  RRR. Respiratory: Normal effort.  No stridor.  Bilateral clear to auscultation. GI: Non-distended.  BS +. Skin: Warm and dry.  Crani site CDI. Psych: Flat.  Delayed.  Confused. Musc: Mild left upper extremity edema. No tenderness in extremities. Neuro: Alert Motor: LUE/LE: 0/5 proximal distal, unchanged Sensation diminished to light touch left side  Assessment/Plan: 1. Functional deficits which require 3+ hours per day of interdisciplinary therapy in a comprehensive inpatient rehab setting.  Physiatrist is providing close team supervision and 24 hour management of active medical problems listed below.  Physiatrist and rehab team continue to assess barriers to discharge/monitor patient progress toward functional and medical goals  Care Tool:  Bathing    Body parts bathed by patient: Left  arm,Chest,Abdomen,Front perineal area,Face   Body parts bathed by helper: Right arm,Buttocks,Right upper leg,Left upper leg,Right lower leg,Left lower leg     Bathing assist Assist Level: Dependent - Patient 0%     Upper Body Dressing/Undressing Upper body dressing   What is the patient wearing?: Pull over shirt    Upper body assist Assist Level: Minimal Assistance - Patient > 75%    Lower Body Dressing/Undressing Lower body dressing      What is the patient wearing?: Pants     Lower body assist Assist for lower body dressing: 2 Helpers (dep in stedy for STS with + 2 to power up)     Toileting Toileting    Toileting assist Assist for toileting: Total Assistance - Patient < 25%     Transfers Chair/bed transfer  Transfers assist  Chair/bed transfer activity did not occur: Safety/medical concerns  Chair/bed transfer assist level: Total Assistance - Patient < 25% (Stedy)     Locomotion Ambulation   Ambulation assist   Ambulation activity did not occur: Safety/medical concerns          Walk 10 feet activity   Assist  Walk 10 feet activity did not occur: Safety/medical concerns        Walk 50 feet activity   Assist Walk 50 feet with 2 turns activity  did not occur: Safety/medical concerns         Walk 150 feet activity   Assist Walk 150 feet activity did not occur: Safety/medical concerns         Walk 10 feet on uneven surface  activity   Assist Walk 10 feet on uneven surfaces activity did not occur: Safety/medical concerns         Wheelchair     Assist Will patient use wheelchair at discharge?: Yes Type of Wheelchair: Manual Wheelchair activity did not occur: Safety/medical concerns         Wheelchair 50 feet with 2 turns activity    Assist    Wheelchair 50 feet with 2 turns activity did not occur: Safety/medical concerns       Wheelchair 150 feet activity     Assist  Wheelchair 150 feet activity did not  occur: Safety/medical concerns       Blood pressure 103/82, pulse 84, temperature (!) 97.4 F (36.3 C), temperature source Oral, resp. rate 16, height 6\' 3"  (1.905 m), weight (!) 147.5 kg, SpO2 97 %. Medical Problem List and Plan: 1.  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   -3/25 Dr. Mickeal Skinner saw pt/family regarding further care plan. He will see pt at [facility] brain tumor clinic after discharge.   Continue CIR 2.  Antithrombotics: -DVT/anticoagulation:  Pharmaceutical: Lovenox  -Dopplers 3/19 revealed thrombus in the left popliteal and peroneal veins. -IR has placed IVC filter  -activity as tolerated             -antiplatelet therapy: N/A 3. Pain Management: Oxycodone prn. Will continue tylenol qid.  -continue HS topamax for headaches and to assist with sleep   -Increased topamax to 50mg .  4. Mood/sleep:    continue low dose seroquel 25mg  bid  Continue trazodone back to 100mg  qhs, continue melatonin  -continue xanax for anxiety 0.5mg  bid prn  -continue paxil 20mg  daily  Neuropsych to eval  Appears stable on 3/27 5. Neuropsych: This patient is partially capable of making decisions on her own behalf.  Telesitter for safety 6. Skin/Wound Care: Routine pressure relief measures.  7. Fluids/Electrolytes/Nutrition:  encourage PO  -protein supp for low albumin 8. Hypotension: Monitor BP tid--off Cozaar at this time.   Slightly soft, but asymptomatic on 3/27 9. ABLA due to UGIB s/p hemo clip:   Hemoglobin 9.8 on 3/24  Continue to monitor 10 Leukocytosis:   -likely related to steroids and DVT. No fever, s/s infection currently  WBCs 15.8 on 3/24, continue to monitor  Afebrile  -3/24 UA negative, ucx with insignificant growth   -tapering steroids 11. Cerebral edema:   -CT findings appear to be what would be expected post-op  On  steroids 12. 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   No breakthrough seizures noted on 3/27 13. Constipation:   Appears to be improving per documentation 14. Hyponatremia:   Sodium 135 on 3/24, labs ordered for tomorrow  LOS: 9 days A FACE TO FACE EVALUATION WAS PERFORMED  Katye Valek Lorie Phenix 09/05/2020, 5:45 PM

## 2020-09-05 NOTE — Progress Notes (Signed)
Speech Language Pathology Daily Session Note  Patient Details  Name: Todd Villa MRN: [id number] Date of Birth: 01-31-1971  Today's Date: 09/05/2020 SLP Individual Time: 0950-1030 SLP Individual Time Calculation (min): 40 min  Short Term Goals: Week 2: SLP Short Term Goal 1 (Week 2): Pt will increase left field scanning to increase safety during functional activities (mobility, reading, writing, etc) provided min A verbal and visual cues SLP Short Term Goal 2 (Week 2): Patient will demonstrate functional problem solving for basic and familiar tasks with Mod A verbal and visual cues. SLP Short Term Goal 3 (Week 2): Patient will demonstrate sustained attention to functional tasks for ~10 minutes with Mod verbal cues for redirection. SLP Short Term Goal 4 (Week 2): Patient will orient to place, time and situation with Min verbal and visual cues. SLP Short Term Goal 5 (Week 2): Patient will identify 2 physical and 2 cognitive changes with Mod verbal cues.  Skilled Therapeutic Interventions:  Pt was seen for skilled ST targeting cognitive goals.  Pt was sleeping soundly upon therapist's arrival, he was slow to awaken and nursing reports that pt had received xanax and seroquel since night shift.  Nursing also reports that pt is typically less active and alert in the morning; however, pt was eventually awakened with environmental modifications and agreeable to participating in [place].  Pt was oriented to place and situation with min question cues.  He could utilize the calendar in his room to reorient to date with min assist for visual scanning.  He needed mod verbal cues to identify realistic goals for therapy as he initially stated that he wanted to take his wife to the beach upon discharging from the hospital.  Pt was able to identify that his "left side" was "weak" with min question cues.  Pt's interactions with therapist remained appropriate for the duration of today's therapy session; however, he  did have brief, intermittent episode where therapist noted increased language of confusion (ie when asked about the location of a place, pt stated "It was in [place] when I was alive.").  Pt was left in bed with bed alarm set and call bell within reach at the end of today's therapy session.  Continue per current plan of care.    Pain Pain Assessment Pain Scale: 0-10 Pain Score: 0-No pain  Therapy/Group: Individual Therapy  Page, Selinda Orion 09/05/2020, 12:37 PM

## 2020-09-06 DIAGNOSIS — G8194 Hemiplegia, unspecified affecting left nondominant side: Secondary | ICD-10-CM | POA: Diagnosis not present

## 2020-09-06 DIAGNOSIS — C719 Malignant neoplasm of brain, unspecified: Secondary | ICD-10-CM | POA: Diagnosis not present

## 2020-09-06 DIAGNOSIS — I82432 Acute embolism and thrombosis of left popliteal vein: Secondary | ICD-10-CM | POA: Diagnosis not present

## 2020-09-06 DIAGNOSIS — G441 Vascular headache, not elsewhere classified: Secondary | ICD-10-CM | POA: Diagnosis not present

## 2020-09-06 LAB — CBC
HCT: 32.4 % — ABNORMAL LOW (ref 39.0–52.0)
Hemoglobin: 10.7 g/dL — ABNORMAL LOW (ref 13.0–17.0)
MCH: 32 pg (ref 26.0–34.0)
MCHC: 33 g/dL (ref 30.0–36.0)
MCV: 97 fL (ref 80.0–100.0)
Platelets: 259 10*3/uL (ref 150–400)
RBC: 3.34 MIL/uL — ABNORMAL LOW (ref 4.22–5.81)
RDW: 18.5 % — ABNORMAL HIGH (ref 11.5–15.5)
WBC: 13.1 10*3/uL — ABNORMAL HIGH (ref 4.0–10.5)
nRBC: 0 % (ref 0.0–0.2)

## 2020-09-06 LAB — BASIC METABOLIC PANEL
Anion gap: 8 (ref 5–15)
BUN: 17 mg/dL (ref 6–20)
CO2: 22 mmol/L (ref 22–32)
Calcium: 8.6 mg/dL — ABNORMAL LOW (ref 8.9–10.3)
Chloride: 106 mmol/L (ref 98–111)
Creatinine, Ser: 0.9 mg/dL (ref 0.61–1.24)
GFR, Estimated: >60 mL/min (ref >60)
Glucose, Bld: 135 mg/dL — ABNORMAL HIGH (ref 70–99)
Potassium: 3.6 mmol/L (ref 3.5–5.1)
Sodium: 136 mmol/L (ref 135–145)

## 2020-09-06 LAB — GLUCOSE, CAPILLARY
Glucose-Capillary: 106 mg/dL — ABNORMAL HIGH (ref 70–99)
Glucose-Capillary: 101 mg/dL — ABNORMAL HIGH (ref 70–99)
Glucose-Capillary: 107 mg/dL — ABNORMAL HIGH (ref 70–99)
Glucose-Capillary: 100 mg/dL — ABNORMAL HIGH (ref 70–99)
Glucose-Capillary: 111 mg/dL — ABNORMAL HIGH (ref 70–99)

## 2020-09-06 NOTE — Progress Notes (Signed)
Occupational Therapy Session Note  Patient Details  Name: Todd Villa MRN: [id number] Date of Birth: [birth year]  Today's Date: 09/06/2020 OT Individual Time: 1444-5848 OT Individual Time Calculation (min): 30 min    Short Term Goals: Week 2:  OT Short Term Goal 1 (Week 2): Pt will transfer wiht MOD A to BSC and LRAD OT Short Term Goal 2 (Week 2): Pt will don shirt with min A OT Short Term Goal 3 (Week 2): Pt will complete one step of LB dressing task  Skilled Therapeutic Interventions/Progress Updates:    Pt received in bed with his cousin present.  Cousin went to waiting room and privacy mode placed on telemonitor for pt to engage in LB dressing from bed. He did a great job with rolling and bridging hips with support through his LLE.  unfortuneatly his catheter came off and spilled, so that took some clean up time. Only had time for LB self care and cleansing this session. Pt has another OT session in 15 min to complete self care.  Pt in bed with telesitter and alarm on.   Therapy Documentation Precautions:  Precautions Precautions: None Restrictions Weight Bearing Restrictions: No LUE Weight Bearing: Non weight bearing RLE Weight Bearing: Weight bearing as tolerated LLE Weight Bearing: Non weight bearing  Pain: Pain Assessment Pain Score: 0-No pain   Therapy/Group: Individual Therapy  Jamera Vanloan 09/06/2020, 12:28 PM

## 2020-09-06 NOTE — Consult Note (Signed)
Neuropsychological Consultation   Patient:   Todd Villa   DOB:   [birth year]  MR Number:  [id number]  Location:  [place] [facility] [place] [id number] [place] [address] Dept: [place]: [phone]           Date of Service:   09/06/2020  Start Time:   9 AM End Time:   10 AM  Provider/Observer:  Ilean Skill, Psy.D.       Clinical Neuropsychologist       Billing Code/Service: [phone]  Chief Complaint:    Todd Villa is a 50 year old male with history of hypertension, OSA possibly, obesity, recent CVA with mild left-sided weakness.  Patient was evaluated at [facility] emergency department on 08/17/2020 after fall with brief loss of consciousness and reports of 2-week history of progressive left-sided weakness with lethargy.  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  Patient did have a seizure in the emergency department and was started on Keppra and transferred to [facility] for management.  Patient underwent right Craney for tumor resection by Dr. Davene Costain. Kathline Magic on 08/21/2020 with pathology revealing high-grade glioma.  Patient with resultant left hemiplegia as well as cognitive deficits particularly with orientation deficits, confusion and reasoning/problem solving deficits.  Reason for Service:  Patient was referred for neuropsychological consultation due to residual cognitive deficits post surgical interventions for GBM.  Below is the HPI for the current admission.  HPI:  Todd Villa is a 50 year old LH-male with history of HTN, OSA?, obesity, recent CVA with mild left sided  weakness who was evaluated at [facility] ED on 08/17/20 after fall with brief LOC and reports to 2 week history of progressive left sided weakness with lethargy. He was found to have large heterogenous mass concerning for GBM centered in deep right frontoparietal region extending to right basal ganglia with prominent vasogenic edema with partial effacement of left lateral ventricle and 7 mm midline shift as well as flattening of optic discs s/o increased ICP. He did have seizure in [facility] ED, was started on Keppra as well as decadron and transferred to [facility] for management.   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  On 03/12, He underwent right crani for tumor resection by Dr. Davene Costain. Kathline Magic with pathology revealing high grade glioma. Hospital course significant for somnolence day after surgery due to progressive cerebral edema which improved with addition of mannitol as well as increase in steroids.  He did develop tachycardia with tarry stools and drop in H/H due to actively bleeding gastric blood vessel which was treated with clipping by GI on 03/12. H/H improved with transfusion of 2 units PRBC and tarry stools continued due to residual blood as H/H stable. Hypotension resolved with d/c of meds and Sq Lovenox added for DVT prophylaxis. Pain managed with tylenol qid and oxycodone prn. Steroids being weaned off. He  has been referred to GI for work up of elevated bilirubin. Patient with resultant left hemiplegia as well as cognitive deficits affecting ADLs and mobility. CIR recommended due to functional decline.   Current Status:  Patient was awake and alert sitting slightly upright in his  bed.  Patient was unsure whether he had seen me before although this was our first visit.  Patient was able to provide some information about previous doctors that he had been seen and taken care of and was aware of surgical interventions and diagnosis.  Patient had abrupt sudden crying spells that lasted  for very short duration.  Patient's expressive language appeared to be within normal limits but is clear that he had difficulties with orientation, confusion.  He was aware of surgeons that he had worked with recently and was able to name his physician here on the unit.  Looking at earlier notes during his CIR stay it does appear that he has been making some significant improvements in cognition although there continue to be significant impairments and the patient has had difficulty putting abstract/complex information together and his orientation specifically around time is disturbed.  Patient with dense left hemiplegia.  When talking about residual deficits the patient would spontaneously cry and would last a few minutes.  Deficits identified correlate quite closely with right frontal and adjacent subcortical involvement of his tumor and recent neurosurgical interventions.  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  Behavioral Observation: Todd Villa  presents as a 50 y.o.-year-old Right Caucasian Male who appeared his stated age. his dress was Appropriate and he was Well Groomed and his manners were Appropriate to the situation.  his participation was indicative of Appropriate, Inattentive and Redirectable behaviors.  There were physical disabilities noted.  he displayed an appropriate level of cooperation and motivation.     Interactions:    Active Appropriate, Inattentive and Redirectable  Attention:   abnormal and attention span appeared shorter than expected for age  Memory:   abnormal; remote memory intact, recent memory impaired  Visuo-spatial:  not examined  Speech (Volume):  normal  Speech:   normal; normal  Thought  Process:  Circumstantial, Tangential and Disorganized  Though Content:  WNL; not suicidal and not homicidal  Orientation:   person, place and situation  Judgment:   Poor  Planning:   Poor  Affect:    Anxious, Labile and Tearful  Mood:    Depressed and Dysphoric  Insight:   Fair  Intelligence:   normal  Medical History:   Past Medical History:  Diagnosis Date  . High blood pressure   . Obesity   . 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 Slow transit constipation   . Sleep disturbance   . Hyponatremia   . New onset seizure ([place])   . Acute deep vein thrombosis (DVT) of popliteal vein of left lower extremity ([place])   . Vascular headache   . Sleep disorder   . Left hemiparesis ([place])   . 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  While patient is having a great deal of confusion he is aware of the nature of this type of brain tumor.   Family Med/Psych History:  Family History  Problem Relation Age of Onset  . Hyperlipidemia Mother   . Heart disease Mother   .  Hypertension Mother   . Diabetes Mother   . Alcohol abuse Father   . Heart disease Father   . Hypertension Father   . Diabetes Sister   . Cancer Neg Hx   . Stroke Neg Hx     Impression/DX:  Todd Villa is a 50 year old male with history of hypertension, OSA possibly, obesity, recent CVA with mild left-sided weakness.  Patient was evaluated at [facility] emergency department on 08/17/2020 after fall with brief loss of consciousness and reports of 2-week history of progressive left-sided weakness with lethargy.  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  Patient did have a seizure in the emergency department and was started on Keppra and transferred to [facility] for management.  Patient underwent right Craney for tumor resection by Dr. Davene Costain. Kathline Magic on 08/21/2020 with pathology revealing high-grade glioma.  Patient with resultant left hemiplegia as well as cognitive deficits particularly with orientation deficits, confusion and reasoning/problem solving deficits.  Patient was awake and alert sitting slightly upright in his bed.  Patient was unsure whether he had seen me before although this was our first visit.  Patient was able to provide some information about previous doctors that he had been seen and taken care of and was aware of surgical interventions and diagnosis.  Patient had abrupt sudden crying spells that lasted for very short duration.  Patient's expressive language appeared to be within normal limits but is clear that he had difficulties with orientation, confusion.  He was aware of surgeons that he had worked with recently and was able to name his physician here on the unit.  Looking at earlier notes during his CIR stay it does appear that he has been making some significant improvements in cognition although there continue to be significant impairments and the patient has had difficulty putting abstract/complex information together and his orientation specifically around time is disturbed.  Patient with dense left hemiplegia.  When talking about residual deficits the patient would spontaneously cry and would last a few minutes.  Deficits identified correlate quite closely with right frontal and adjacent subcortical involvement of his tumor and recent neurosurgical interventions.  Patient's mood disturbance are consistent with pseudobulbar affect type symptoms do appear to  be triggered by very specific cues versus nonspecific use and I do think that this is primarily related to right frontal lobe involvement and not directly related to symptoms of pseudobulbar affect per se.  Disposition/Plan:  It appears that the patient has been like some significant improvement but continued cognitive deficits persist.  Talking with providers and looking at notes the patient appears to have made some significant improvements with his orientation understanding but continues to have sudden crying spells but they were all tied specifically with the subject matter.  The patient does not appear to persist with his emotional distress very long.  We will follow up with the patient as soon as possible.          Electronically Signed   _______________________ Ilean Skill, Psy.D. Clinical Neuropsychologist

## 2020-09-06 NOTE — Progress Notes (Signed)
PROGRESS NOTE   Subjective/Complaints: Had a pretty good weekend. Says that headaches are down. Able to sleep a little better. Remembered by name. Left side still weak.   ROS: Patient denies fever, rash, sore throat, blurred vision, nausea, vomiting, diarrhea, cough, shortness of breath or chest pain, joint or back pain,   or mood change.   Objective:   No results found. Recent Labs    09/06/20 0824  WBC 13.1*  HGB 10.7*  HCT 32.4*  PLT 259   Recent Labs    09/06/20 0824  NA 136  K 3.6  CL 106  CO2 22  GLUCOSE 135*  BUN 17  CREATININE 0.90  CALCIUM 8.6*    Intake/Output Summary (Last 24 hours) at 09/06/2020 1228 Last data filed at 09/06/2020 1100 Gross per 24 hour  Intake 222 ml  Output 2250 ml  Net -2028 ml        Physical Exam: Vital Signs Blood pressure 109/78, pulse 60, temperature (!) 97.5 F (36.4 C), temperature source Oral, resp. rate 18, height 6\' 3"  (1.905 m), weight (!) 147.5 kg, SpO2 100 %. Constitutional: No distress . Vital signs reviewed. HEENT: EOMI, oral membranes moist Neck: supple Cardiovascular: RRR without murmur. No JVD    Respiratory/Chest: CTA Bilaterally without wheezes or rales. Normal effort    GI/Abdomen: BS +, non-tender, non-distended Ext: no clubbing, cyanosis, or edema Psych: pleasant and cooperative Skin: wound intact. No drainage Musc: Mild left upper extremity edema. No tenderness in extremities. Neuro: Alert. Improved memory, awareness, insight Motor: LUE trace chest, biceps, 0/5 distally. 0/5 LLE Sensation diminished to light touch left side  Assessment/Plan: 1. Functional deficits which require 3+ hours per day of interdisciplinary therapy in a comprehensive inpatient rehab setting.  Physiatrist is providing close team supervision and 24 hour management of active medical problems listed below.  Physiatrist and rehab team continue to assess barriers to  discharge/monitor patient progress toward functional and medical goals  Care Tool:  Bathing    Body parts bathed by patient: Left arm,Chest,Abdomen,Front perineal area,Face   Body parts bathed by helper: Right arm,Buttocks,Right upper leg,Left upper leg,Right lower leg,Left lower leg     Bathing assist Assist Level: Dependent - Patient 0%     Upper Body Dressing/Undressing Upper body dressing   What is the patient wearing?: Pull over shirt    Upper body assist Assist Level: Minimal Assistance - Patient > 75%    Lower Body Dressing/Undressing Lower body dressing      What is the patient wearing?: Pants     Lower body assist Assist for lower body dressing: 2 Helpers (dep in stedy for STS with + 2 to power up)     Toileting Toileting    Toileting assist Assist for toileting: Total Assistance - Patient < 25%     Transfers Chair/bed transfer  Transfers assist  Chair/bed transfer activity did not occur: Safety/medical concerns  Chair/bed transfer assist level: Total Assistance - Patient < 25% (Stedy)     Locomotion Ambulation   Ambulation assist   Ambulation activity did not occur: Safety/medical concerns          Walk 10 feet activity   Assist  Walk 10 feet activity did not occur: Safety/medical concerns        Walk 50 feet activity   Assist Walk 50 feet with 2 turns activity did not occur: Safety/medical concerns         Walk 150 feet activity   Assist Walk 150 feet activity did not occur: Safety/medical concerns         Walk 10 feet on uneven surface  activity   Assist Walk 10 feet on uneven surfaces activity did not occur: Safety/medical concerns         Wheelchair     Assist Will patient use wheelchair at discharge?: Yes Type of Wheelchair: Manual Wheelchair activity did not occur: Safety/medical concerns         Wheelchair 50 feet with 2 turns activity    Assist    Wheelchair 50 feet with 2 turns  activity did not occur: Safety/medical concerns       Wheelchair 150 feet activity     Assist  Wheelchair 150 feet activity did not occur: Safety/medical concerns       Blood pressure 109/78, pulse 60, temperature (!) 97.5 F (36.4 C), temperature source Oral, resp. rate 18, height 6\' 3"  (1.905 m), weight (!) 147.5 kg, SpO2 100 %. Medical Problem List and Plan: 1.  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   - Dr. Mickeal Skinner has seen pt/family regarding further care plan. He will see pt at [facility] brain tumor clinic after discharge.   -Continue CIR therapies including PT, OT, and SLP  2.  Antithrombotics: -DVT/anticoagulation:  Pharmaceutical: Lovenox  -Dopplers 3/19 revealed thrombus in the left popliteal and peroneal veins. -IR has placed IVC filter   -antiplatelet therapy: N/A 3. Pain Management: Oxycodone prn. Will continue tylenol qid.  -continue HS topamax for headaches and to assist with sleep   -Increased topamax to 50mg .  4. Mood/sleep:    continue low dose seroquel 25mg  bid  Continue trazodone 100mg  qhs, continue melatonin  -continue xanax for anxiety 0.5mg  bid prn  -continue paxil 20mg  daily  Neuropsych evaluation today appreciated  Appears improved 3/28 5. Neuropsych: This patient is partially capable of making decisions on her own behalf.  Telesitter for safety 6. Skin/Wound Care: Routine pressure relief measures.  7. Fluids/Electrolytes/Nutrition:  encourage PO  -protein supp for low albumin 8. Hypotension: Monitor BP tid--off Cozaar at this time.   Remains soft, but asymptomatic on 3/28 9. ABLA due to UGIB s/p hemo clip:   Hemoglobin 9.8 on 3/24--->10.7 3/28    10 Leukocytosis:   -likely related to steroids and DVT. No fever, s/s infection currently  WBCs 15.8 on 3/24-->13.1 3/28  Afebrile  -3/24 UA  negative, ucx with insignificant growth   -tapering steroids--will decrease again later this week 11. Cerebral edema:   -CT findings appear to be what would be expected post-op  -steroids as above 12. 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   No breakthrough seizures noted on 3/27 13. Constipation:   Appears to be improving per documentation 14. Hyponatremia:   Sodium 135 on 3/24, labs ordered for tomorrow  LOS: 10 days A FACE TO [place] 09/06/2020, 12:28 PM

## 2020-09-06 NOTE — Progress Notes (Signed)
Speech Language Pathology Daily Session Note  Patient Details  Name: Todd Villa MRN: [id number] Date of Birth: June 26, 1970  Today's Date: 09/06/2020 SLP Individual Time: 0725-0810 SLP Individual Time Calculation (min): 45 min  Short Term Goals: Week 2: SLP Short Term Goal 1 (Week 2): Pt will increase left field scanning to increase safety during functional activities (mobility, reading, writing, etc) provided min A verbal and visual cues SLP Short Term Goal 2 (Week 2): Patient will demonstrate functional problem solving for basic and familiar tasks with Mod A verbal and visual cues. SLP Short Term Goal 3 (Week 2): Patient will demonstrate sustained attention to functional tasks for ~10 minutes with Mod verbal cues for redirection. SLP Short Term Goal 4 (Week 2): Patient will orient to place, time and situation with Min verbal and visual cues. SLP Short Term Goal 5 (Week 2): Patient will identify 2 physical and 2 cognitive changes with Mod verbal cues.  Skilled Therapeutic Interventions: Skilled treatment session focused on cognitive goals. SLP facilitated session by providing tray set-up with breakfast meal. Patient consumed meal with overall Mod I for sustained attention and to scan to locate items in left visual field. Patient was oriented to place and time with Mod I and did not demonstrate any language of confusion this session. Patient also completed a basic medication management task with Min verbal cues for problem solving. Overall, patient was socially appropriate with any lability noted. Patient left upright in bed with alarm on and all needs within reach. Continue with current plan of care.      Pain No/Denies Pain   Therapy/Group: Individual Therapy  Caterin Tabares 09/06/2020, 8:23 AM

## 2020-09-06 NOTE — Progress Notes (Signed)
Physical Therapy Weekly Progress Note  Patient Details  Name: Todd Villa MRN: [id number] Date of Birth: Jan 11, 1971  Beginning of progress report period: August 28, 2020 End of progress report period: September 06, 2020  Today's Date: 09/06/2020 PT Individual Time: 4098-1191 PT Individual Time Calculation (min): 59 min   Patient has met 2 of 3 short term goals.  Pt is progressing toward mobility goals, improving independence with bed mobility and functional transfers. Pt is performing bed mobility at maxA level consistently, bed to chair transfers with modA and use of slideboard, and sit to stand with modA. Pt has dense hemiplegia in L hemibody and associated impairments in balance and transfers, and has been unable to attempt ambulation thus far. Pt has also demonstrated emotional lability, impairing progress toward mobility goals.  Patient continues to demonstrate the following deficits muscle weakness, decreased cardiorespiratoy endurance, decreased coordination, decreased attention to left, decreased awareness, decreased problem solving and decreased safety awareness and decreased sitting balance, decreased standing balance, decreased postural control, hemiplegia and decreased balance strategies and therefore will continue to benefit from skilled PT intervention to increase functional independence with mobility.  Patient progressing toward long term goals..  Continue plan of care.  PT Short Term Goals Week 1:  PT Short Term Goal 1 (Week 1): Pt will perform bed mobility with max assist of 1. PT Short Term Goal 1 - Progress (Week 1): Met PT Short Term Goal 2 (Week 1): Pt will transfer to and from [facility] with max assist PT Short Term Goal 2 - Progress (Week 1): Met PT Short Term Goal 3 (Week 1): Pt will propel WC 80f with min assist PT Short Term Goal 3 - Progress (Week 1): Progressing toward goal Week 2:  PT Short Term Goal 1 (Week 2): Pt will perform bed mobility consistently with modA. PT  Short Term Goal 2 (Week 2): Pt will perform bed to chair transfer consistently with modA. PT Short Term Goal 3 (Week 2): Pt will initiate gait training.  Skilled Therapeutic Interventions/Progress Updates:  Ambulation/gait training;Balance/vestibular training;Cognitive remediation/compensation;Community reintegration;Discharge planning;Disease management/prevention;DME/adaptive equipment instruction;Neuromuscular re-education;Functional mobility training;Functional electrical stimulation;Pain management;Patient/family education;Skin care/wound management;Psychosocial support;Splinting/orthotics;Stair training;Therapeutic Activities;Therapeutic Exercise;UE/LE Coordination activities;Visual/perceptual remediation/compensation;Wheelchair propulsion/positioning;UE/LE Strength taining/ROM   Pt received seated in WC and agrees to therapy. No complaint of pain. WC transport to gym for time management. Pt performs slideboard transfer to mat table with modA and cues for body mechanics and head hip relationship. Seated on mat, pt performs NMR for sitting balance and weight shifting, using "fishing" simulation, casting line and attempting to pick up pieces of tape with fishing hook. PT provides cues for posture and attempting to utilize L hand, though pt does not demo any activation of distal L upper extremity at this time. Pt then performs L heel slides in short sitting with towel placed under L foot. Pt is able to flex knee slightly and trace activation of knee extensors, but requires AAROM to complete activity. Pt performs sit to stand x3 from mat, with PT blocking L knee and providing modA overall. Pt practices lateral weight shifting for pre gait training but no steps taken at this time. Pt transfers from mat>WC>bed with slideboard and modA. Sit to supine with maxA. Left supine in bed with alarm intact and all needs within reach.  Therapy Documentation Precautions:  Precautions Precautions:  None Restrictions Weight Bearing Restrictions: No LUE Weight Bearing: Non weight bearing RLE Weight Bearing: Weight bearing as tolerated LLE Weight Bearing: Non weight bearing  Therapy/Group: Individual  Therapy  Breck Coons, PT, DPT 09/06/2020, 4:04 PM

## 2020-09-06 NOTE — Progress Notes (Signed)
Patient slept well throughout the night. No aggressive behaviors noted. Slept for 6 hours this shift.

## 2020-09-06 NOTE — Progress Notes (Signed)
Occupational Therapy Session Note  Patient Details  Name: Todd Villa MRN: [id number] Date of Birth: Jun 09, 1971  Today's Date: 09/06/2020 OT Individual Time: 1130-1200 OT Individual Time Calculation (min): 30 min   Short Term Goals: Week 2:  OT Short Term Goal 1 (Week 2): Pt will transfer wiht MOD A to BSC and LRAD OT Short Term Goal 2 (Week 2): Pt will don shirt with min A OT Short Term Goal 3 (Week 2): Pt will complete one step of LB dressing task  Skilled Therapeutic Interventions/Progress Updates:    Pt greeted semi-reclined in bed and agreeable to OT treatment session.Pt pleasant, no aggressive behaviors today, and only tearful 1 time today. Pt completed bed mobility with assist to advance L side to EOB. Mod A to elevate trunk. Worked on sitting balance while performing UB bathing tasks. Hand over hand A to integrate L UE for neuro re-ed. Trace activation noted at shoulder/scap and trace can be felt slightly in hand. Pt had no overt LOB while seated EOB. Worked on sit<>stands from EOB with OT blocking L knee. Worked on some weight shifting to L in standing, Pt then requested to get into wc to eat lunch. Worked on slideboard transfer from bed to wc on stronger R side. OT had his cousin hold the wc, then pt needed Mod A to scoot to stronger side. Pt left seated in wc with alarm belt on and L UE supported on pillow and bed.   Therapy Documentation Precautions:  Precautions Precautions: None Restrictions Weight Bearing Restrictions: No LUE Weight Bearing: Non weight bearing RLE Weight Bearing: Weight bearing as tolerated LLE Weight Bearing: Non weight bearing Pain:   denies pain  Therapy/Group: Individual Therapy  Valma Cava 09/06/2020, 12:27 PM

## 2020-09-07 DIAGNOSIS — G8194 Hemiplegia, unspecified affecting left nondominant side: Secondary | ICD-10-CM | POA: Diagnosis not present

## 2020-09-07 DIAGNOSIS — I82432 Acute embolism and thrombosis of left popliteal vein: Secondary | ICD-10-CM | POA: Diagnosis not present

## 2020-09-07 DIAGNOSIS — G441 Vascular headache, not elsewhere classified: Secondary | ICD-10-CM | POA: Diagnosis not present

## 2020-09-07 DIAGNOSIS — C719 Malignant neoplasm of brain, unspecified: Secondary | ICD-10-CM | POA: Diagnosis not present

## 2020-09-07 LAB — GLUCOSE, CAPILLARY
Glucose-Capillary: 106 mg/dL — ABNORMAL HIGH (ref 70–99)
Glucose-Capillary: 100 mg/dL — ABNORMAL HIGH (ref 70–99)
Glucose-Capillary: 150 mg/dL — ABNORMAL HIGH (ref 70–99)
Glucose-Capillary: 132 mg/dL — ABNORMAL HIGH (ref 70–99)

## 2020-09-07 MED ORDER — DEXTROMETHORPHAN-QUINIDINE 20-10 MG PO CAPS
1.0000 | ORAL_CAPSULE | Freq: Two times a day (BID) | ORAL | Status: DC
  Administered 2020-09-07 (×2): 1 via ORAL
  Filled 2020-09-07 (×5): qty 1

## 2020-09-07 NOTE — Progress Notes (Signed)
Patient is restful throughout shift appears in no acute distress or discomfort, coloration adequate, respiration unlabored on room air.no know agitation or irritability noted.Tylenol po administered at 2203 for discomfort headache, upon reassessment verbalized feeling better.Sleep chart continues with monitoring, made as comfortable as possible

## 2020-09-07 NOTE — Progress Notes (Signed)
PROGRESS NOTE   Subjective/Complaints: Did fairly well last night. Therapy notes improved cognition and participation across the board this week. Still has a headache but it seems less severe. Can become tearful easily but less irritable.   ROS: Limited due to cognitive/behavioral    Objective:   No results found. Recent Labs    09/06/20 0824  WBC 13.1*  HGB 10.7*  HCT 32.4*  PLT 259   Recent Labs    09/06/20 0824  NA 136  K 3.6  CL 106  CO2 22  GLUCOSE 135*  BUN 17  CREATININE 0.90  CALCIUM 8.6*    Intake/Output Summary (Last 24 hours) at 09/07/2020 1156 Last data filed at 09/07/2020 0848 Gross per 24 hour  Intake --  Output 850 ml  Net -850 ml        Physical Exam: Vital Signs Blood pressure 124/72, pulse 65, temperature 98.8 F (37.1 C), temperature source Oral, resp. rate 20, height 6\' 3"  (1.905 m), weight (!) 147.5 kg, SpO2 100 %. Constitutional: No distress . Vital signs reviewed. HEENT: EOMI, oral membranes moist Neck: supple Cardiovascular: RRR without murmur. No JVD    Respiratory/Chest: CTA Bilaterally without wheezes or rales. Normal effort    GI/Abdomen: BS +, non-tender, non-distended Ext: no clubbing, cyanosis, or edema Psych: pleasant, engaging. Can quickly become tearful when discussing personal/famly issues Skin: sl drainage from an area or two on incision where he has picked/rubbed Musc: Mild left upper extremity edema.  . Neuro: Alert. Improved memory, awareness, insight Motor: LUE trace chest, biceps, 0/5 distally. 0/5 LLE--no changes Sensation diminished to light touch left side  Assessment/Plan: 1. Functional deficits which require 3+ hours per day of interdisciplinary therapy in a comprehensive inpatient rehab setting.  Physiatrist is providing close team supervision and 24 hour management of active medical problems listed below.  Physiatrist and rehab team continue to  assess barriers to discharge/monitor patient progress toward functional and medical goals  Care Tool:  Bathing    Body parts bathed by patient: Left arm,Chest,Abdomen,Front perineal area,Face   Body parts bathed by helper: Right arm,Buttocks,Right upper leg,Left upper leg,Right lower leg,Left lower leg     Bathing assist Assist Level: Dependent - Patient 0%     Upper Body Dressing/Undressing Upper body dressing   What is the patient wearing?: Pull over shirt    Upper body assist Assist Level: Minimal Assistance - Patient > 75%    Lower Body Dressing/Undressing Lower body dressing      What is the patient wearing?: Pants     Lower body assist Assist for lower body dressing: 2 Helpers (dep in stedy for STS with + 2 to power up)     Toileting Toileting    Toileting assist Assist for toileting: Total Assistance - Patient < 25%     Transfers Chair/bed transfer  Transfers assist  Chair/bed transfer activity did not occur: Safety/medical concerns  Chair/bed transfer assist level: Moderate Assistance - Patient 50 - 74% Chair/bed transfer assistive device: Sliding board   Locomotion Ambulation   Ambulation assist   Ambulation activity did not occur: Safety/medical concerns          Walk 10 feet  activity   Assist  Walk 10 feet activity did not occur: Safety/medical concerns        Walk 50 feet activity   Assist Walk 50 feet with 2 turns activity did not occur: Safety/medical concerns         Walk 150 feet activity   Assist Walk 150 feet activity did not occur: Safety/medical concerns         Walk 10 feet on uneven surface  activity   Assist Walk 10 feet on uneven surfaces activity did not occur: Safety/medical concerns         Wheelchair     Assist Will patient use wheelchair at discharge?: Yes Type of Wheelchair: Manual Wheelchair activity did not occur: Safety/medical concerns         Wheelchair 50 feet with 2 turns  activity    Assist    Wheelchair 50 feet with 2 turns activity did not occur: Safety/medical concerns       Wheelchair 150 feet activity     Assist  Wheelchair 150 feet activity did not occur: Safety/medical concerns       Blood pressure 124/72, pulse 65, temperature 98.8 F (37.1 C), temperature source Oral, resp. rate 20, height 6\' 3"  (1.905 m), weight (!) 147.5 kg, SpO2 100 %. Medical Problem List and Plan: 1.  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   - Dr. Mickeal Skinner has seen pt/family regarding further care plan. He will see pt at [facility] brain tumor clinic after discharge.   --Interdisciplinary Team Conference today    2.  Antithrombotics: -DVT/anticoagulation:  Pharmaceutical: Lovenox  -Dopplers 3/19 revealed thrombus in the left popliteal and peroneal veins. -IR has placed IVC filter   -antiplatelet therapy: N/A 3. Pain Management: Oxycodone prn. Will continue tylenol qid.  -continue HS topamax for headaches and to assist with sleep    - topamax to 50mg .  4. Mood/sleep:    -sleep chart  continue low dose seroquel 25mg  bid  Continue trazodone 100mg  qhs, continue melatonin  -continue xanax for anxiety 0.5mg  bid prn  -continue paxil 20mg  daily  Neuropsych evaluation today appreciated  I see pseudobulbar sx, not just impulsive behavior   -will try nuedexta while here to see if he experiences benefit 5. Neuropsych: This patient is partially capable of making decisions on her own behalf.  Telesitter for safety 6. Skin/Wound Care: Routine pressure relief measures.  7. Fluids/Electrolytes/Nutrition:  encourage PO  -protein supp for low albumin 8. Hypotension: Monitor BP tid--off Cozaar at this time.   Remains soft, but asymptomatic on 3/29 9. ABLA due to UGIB s/p hemo clip:   Hemoglobin 9.8 on 3/24--->10.7  3/28    10 Leukocytosis:   -likely related to steroids and DVT. No fever, s/s infection currently  WBCs 15.8 on 3/24-->13.1 3/28  Afebrile  -3/24 UA negative, ucx with insignificant growth   -tapering steroids--will decrease again later this week 11. Cerebral edema:   -CT findings appear to be what would be expected post-op  -steroids as above 12. 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   No breakthrough seizures noted on 3/27 13. Constipation:   Appears to be improving per documentation 14. Hyponatremia:   Sodium 135 on 3/24-->136 on 3/28  LOS: 11 days A FACE TO FACE EVALUATION WAS PERFORMED  Meredith Staggers 09/07/2020,  11:56 AM

## 2020-09-07 NOTE — Progress Notes (Signed)
Occupational Therapy Session Note  Patient Details  Name: DAYLEN LIPSKY MRN: [id number] Date of Birth: 12/20/1970  Today's Date: 09/07/2020 OT Individual Time: 1400-1445 OT Individual Time Calculation (min): 45 min   Short Term Goals: Week 2:  OT Short Term Goal 1 (Week 2): Pt will transfer wiht MOD A to BSC and LRAD OT Short Term Goal 2 (Week 2): Pt will don shirt with min A OT Short Term Goal 3 (Week 2): Pt will complete one step of LB dressing task  Skilled Therapeutic Interventions/Progress Updates:    Pt greeted semi-reclined in bed in awkward position with one LE close to EOB. Pt felt like he had been incontinent of urine, requiring total A for peri-care and brief change. Pt brought to sitting with mod A. Worked on donning pants at EOB with pt able to thread R LE through pant legs today. Sit<>stand at EOB with 2 trials and max A with L knee block- pt then able to help pull up pants. Blocked practice for UB dressing with pt demonstrating good carryover. L UE NMR seated at EOB. OT provided joint input through wrist and elbow and pt was able to turn on flexor patterns voluntarily. Pt even able to grasp today! Sit<>stand at EOB again 3 more x, then pt returned to supine. Pt's wife and daughter entered room and pt left semi-reclined in bed with bed alarm on, family present and needs met.  Therapy Documentation Precautions:  Precautions Precautions: None Restrictions Weight Bearing Restrictions: No LUE Weight Bearing: Non weight bearing RLE Weight Bearing: Weight bearing as tolerated LLE Weight Bearing: Non weight bearing Pain:   denies pain  Therapy/Group: Individual Therapy  Valma Cava 09/07/2020, 2:54 PM

## 2020-09-07 NOTE — Progress Notes (Signed)
Patient somewhat restless, confusion, and tearful episodes at interval, wife called x2 per patient request, but tearful throughout conversation, emotional support provided with Tele-sitter monitoring for safety, Patient consistently states that he is not wrothy to be alive because he cant take care of his family. Xanax po administered and reassurance, monitor. Sleep chart monitoring  continue.

## 2020-09-07 NOTE — Progress Notes (Signed)
Patient ID: Todd Villa, male   DOB: [birth year], 50 y.o.   MRN: [id number]  SW met with pt in room yesterday to inform on d/c date. Pt was very concerned about his behaviors and if he had yelled at this SW at any time. Pt was apologetic if he had. Pt again was very excited about now having a discharge date.   SW called [name] [phone] to provide update from team conference, and d/c date 4/15. SW to follow-up after team conference with updates.   Loralee Pacas, MSW, [place] Office: [phone] Cell: [phone] Fax: [phone]

## 2020-09-07 NOTE — Patient Care Conference (Signed)
Inpatient RehabilitationTeam Conference and Plan of Care Update Date: 09/07/2020   Time: 10:47 AM    Patient Name: Todd Villa      Medical Record Number: [id number]  Date of Birth: [birth year] Sex: Male         Room/Bed: 4W12C/4W12C-02 Payor Info: Payor: Theme park manager / Plan: [employer] PPO / Product Type: *No Product type* /    Admit Date/Time:  08/27/2020 11:42 AM  Primary Diagnosis:  Glioblastoma multiforme of brain [facility])  Hospital Problems: Principal Problem:   Glioblastoma multiforme of brain ([place]) Active Problems:   Acute deep vein thrombosis (DVT) of popliteal vein of left lower extremity (H[facility])   Vascular headache   Sleep disorder   Left hemiparesis ([place])   Sleep disturbance   Hyponatremia   New onset seizure ([place])   Slow transit constipation    Expected Discharge Date: Expected Discharge Date: 09/25/20  Team Members Present: Physician leading conference: Dr. Alger Simons Care Coodinator Present: Loralee Pacas, LCSWA;Enrrique Mierzwa Creig Hines, RN, BSN, CRRN Nurse Present: Dorthula Nettles, RN PT Present: Tereasa Coop, PT OT Present: Cherylynn Ridges, OT SLP Present: Weston Anna, SLP PPS Coordinator present : Gunnar Fusi, SLP     Current Status/Progress Goal Weekly Team Focus  Bowel/Bladder   Patient is continent with incontinent episodes of bladder/bowel, during the night hours,condom cath applied at [facility], [facility] 09/05/20  to restore full continence of bladder/bowel  QS/PRN assessment   Swallow/Nutrition/ Hydration             ADL's   Worked on slideboard transfers, Max A, Mod A stedy transfers, Max A LB ADLs, MOd A UB ADLs  min A, some supervision level  self-care retraining, L attention, sitting balance, transfrs, L NMR   Mobility   mod to maxA bed mobility, modA sit to stand, modA slideboard transfers  Supervision (may need to downgrade)  transfer efficiency and safety, L hemibody NMR and strengthening, intiate ambulation   Communication              Safety/Cognition/ Behavioral Observations  Mod A  Supervision-Mod I  attention, awareness, problem solving and recall   Pain             Skin   s/p craniotomy healing well, open to airm and intact  Incision area to head without signs of  infection or complications,  Continue to assess surgical area QS/PRN, no new skin breakdown noted     Discharge Planning:  Pt reports that he will d/c to home and his wife will provide 24/7 care. SW waiting to verify support from wife. Cancer tx to begin here in [place].   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 He is continent/incontinent, no complaints of pain, is on a sleep/wake chart, incision to the right scalp is clean and dry. Educating on HTN, wound care, and safety precautions. Waiting on [place] to schedule treatments. Patient on target to meet rehab goals: Yes, Friday was a rough day, had an episode of agitation. Has had none since. Slide board transfers are going well at a mod assist. Sit to stands are mod assist, left knee tends to buckle. Working on Patent examiner. MD states that due to the location of the tumor it is unlikely there will much if any return to the left side. Therapy will focus on safety awareness for the left  side. His attention has improved but still present language of confusion.  *See Care Plan and progress notes for long and short-term goals.   Revisions to Treatment Plan:  MD backing off on steroids.  Teaching Needs: Family education, medication management, pain management, skin/wound care, bowel/bladder management, oncology care, transfer training, balance training, endurance training, gait training, safety awareness.  Current Barriers to Discharge: Decreased caregiver support, Medical stability, Home enviroment access/layout,  Incontinence, Wound care, Lack of/limited family support, Weight, Medication compliance, Pending chemo/radiation and Behavior  Possible Resolutions to Barriers: Continue current medications, provide emotional support.     Medical Summary Current Status: dense left hemiparesis, sleep/pain a little better. behavior less agitated but still with pseudobulbar affect. headaches present but seem better control, dr. Mickeal Skinner will be leading his onc care  Barriers to Discharge: Medical stability   Possible Resolutions to Barriers/Weekly Focus: improving sleep-wake, ongoing behavior mgt, ?neudexta trial, pain mgt   Continued Need for Acute Rehabilitation Level of Care: The patient requires daily medical management by a physician with specialized training in physical medicine and rehabilitation for the following reasons: Direction of a multidisciplinary physical rehabilitation program to maximize functional independence : Yes Medical management of patient stability for increased activity during participation in an intensive rehabilitation regime.: Yes Analysis of laboratory values and/or radiology reports with any subsequent need for medication adjustment and/or medical intervention. : Yes   I attest that I was present, lead the team conference, and concur with the assessment and plan of the team.   Cristi Loron 09/07/2020, 4:23 PM

## 2020-09-07 NOTE — Progress Notes (Signed)
Physical Therapy Session Note  Patient Details  Name: Todd Villa MRN: [id number] Date of Birth: 05-Sep-1970  Today's Date: 09/07/2020 PT Individual Time: 0900-1000 PT Individual Time Calculation (min): 60 min   Short Term Goals: Week 2:  PT Short Term Goal 1 (Week 2): Pt will perform bed mobility consistently with modA. PT Short Term Goal 2 (Week 2): Pt will perform bed to chair transfer consistently with modA. PT Short Term Goal 3 (Week 2): Pt will initiate gait training.  Skilled Therapeutic Interventions/Progress Updates:     1st Session: Pt received supine in bed and agrees to therapy. Reports some pain in buttocks. Number not provided. PT provides repositioning and rest breaks as needed to manage pain. Supine to sit with modA and cues for body mechanics and sequencing. Pt performs slideboard transfer to [facility] with modA. WC transport to gym for time management. Slideboard transfer to mat table with modA. During transfer to loses trunk control and balance to the R and posteriorly, "falling" backward onto mat, but with no pain associated. PT educates on importance of anterior trunk lean for safe mobility. PT attempts to assist pt into walking harness with maxi-sky, but pt unable to functionally stand in harness. Pt practices multiple reps of R and L lateral leaning to don and doff harness, however. Pt then performs x3 reps of sit to stand from mat, with ModA and blocking of L knee. Pt able to fully stand and practices lateral weight shifting. Pt progresses to attempting lifting R leg up but has full buckling in L leg and sits suddenly back on mat. Slideboard transfer from mat>WC>bed with modA. Sit to supine with modA management of B lower extremities. Left supine with alarm intact and all needs within reach.  2nd Session: Pt supine in bed. Wife and daughter present. Pt requests to rest and visit with family at this time. PT will follow up as appropriate. Pt misses 45 minutes of skilled  PT.  Therapy Documentation Precautions:  Precautions Precautions: None Restrictions Weight Bearing Restrictions: No LUE Weight Bearing: Non weight bearing RLE Weight Bearing: Weight bearing as tolerated LLE Weight Bearing: Non weight bearing   Therapy/Group: Individual Therapy  Breck Coons, PT, DPT 09/07/2020, 3:34 PM

## 2020-09-07 NOTE — Progress Notes (Signed)
Speech Language Pathology Daily Session Note  Patient Details  Name: DETRELL UMSCHEID MRN: [id number] Date of Birth: [birth year]  Today's Date: 09/07/2020 SLP Individual Time: 0725-0825 SLP Individual Time Calculation (min): 60 min  Short Term Goals: Week 2: SLP Short Term Goal 1 (Week 2): Pt will increase left field scanning to increase safety during functional activities (mobility, reading, writing, etc) provided min A verbal and visual cues SLP Short Term Goal 2 (Week 2): Patient will demonstrate functional problem solving for basic and familiar tasks with Mod A verbal and visual cues. SLP Short Term Goal 3 (Week 2): Patient will demonstrate sustained attention to functional tasks for ~10 minutes with Mod verbal cues for redirection. SLP Short Term Goal 4 (Week 2): Patient will orient to place, time and situation with Min verbal and visual cues. SLP Short Term Goal 5 (Week 2): Patient will identify 2 physical and 2 cognitive changes with Mod verbal cues.  Skilled Therapeutic Interventions: Skilled treatment session focused on cognitive goals. Upon arrival, patient was on the phone with his wife but appropriately terminated the call when clinician arrived. Patient completed tray set up himself (opening drinks and fruit cup, peeled a hard boiled egg) while utilizing his RUE. Patient with increased verbosity regarding talking about his family resulting in increased lability requiring Min verbal cues for attention to self-feeding. Patient oriented X 4 with Min verbal cues for use of calendar for orientation to date with intermittent language of confusion noted. However, patient did attempt to utilize the external aids to maximize recall of clinician and staff's names throughout session. Patient also participated in a single stimuli cancellation task in which he missed 13 out of 105 occurrences with 7 of them occurring in his left field of environment. Patient demonstrated sustained attention to task  for ~10 minutes. Patient left upright in bed with alarm on and all needs within reach. Continue with current plan of care.      Pain Pain Assessment Pain Scale: 0-10 Pain Score: 0-No pain  Therapy/Group: Individual Therapy  Dalene Robards 09/07/2020, 10:06 AM

## 2020-09-08 DIAGNOSIS — C719 Malignant neoplasm of brain, unspecified: Secondary | ICD-10-CM | POA: Diagnosis not present

## 2020-09-08 DIAGNOSIS — I82432 Acute embolism and thrombosis of left popliteal vein: Secondary | ICD-10-CM | POA: Diagnosis not present

## 2020-09-08 DIAGNOSIS — G8194 Hemiplegia, unspecified affecting left nondominant side: Secondary | ICD-10-CM | POA: Diagnosis not present

## 2020-09-08 DIAGNOSIS — G441 Vascular headache, not elsewhere classified: Secondary | ICD-10-CM | POA: Diagnosis not present

## 2020-09-08 LAB — GLUCOSE, CAPILLARY
Glucose-Capillary: 138 mg/dL — ABNORMAL HIGH (ref 70–99)
Glucose-Capillary: 121 mg/dL — ABNORMAL HIGH (ref 70–99)
Glucose-Capillary: 87 mg/dL (ref 70–99)
Glucose-Capillary: 111 mg/dL — ABNORMAL HIGH (ref 70–99)

## 2020-09-08 MED ORDER — ACETAMINOPHEN 325 MG PO TABS: 650.0000 mg | ORAL_TABLET | Freq: Three times a day (TID) | ORAL | Status: DC

## 2020-09-08 MED ORDER — LORAZEPAM 2 MG/ML IJ SOLN
INTRAMUSCULAR | Status: AC
  Administered 2020-09-08: 1 mg via INTRAMUSCULAR
  Filled 2020-09-08: qty 1

## 2020-09-08 MED ORDER — HALOPERIDOL LACTATE 5 MG/ML IJ SOLN
5.0000 mg | Freq: Once | INTRAMUSCULAR | Status: AC
  Administered 2020-09-08: 5 mg via INTRAMUSCULAR
  Filled 2020-09-08 (×2): qty 1

## 2020-09-08 MED ORDER — LORAZEPAM 2 MG/ML IJ SOLN: 1.0000 mg | Freq: Once | INTRAMUSCULAR | Status: AC

## 2020-09-08 MED ORDER — LORAZEPAM 2 MG/ML IJ SOLN
INTRAMUSCULAR | Status: AC
  Administered 2020-09-08: 2 mg via INTRAMUSCULAR
  Filled 2020-09-08: qty 1

## 2020-09-08 MED ORDER — QUETIAPINE FUMARATE 50 MG PO TABS
50.0000 mg | ORAL_TABLET | Freq: Two times a day (BID) | ORAL | Status: DC
  Administered 2020-09-09 – 2020-09-15 (×13): 50 mg via ORAL
  Filled 2020-09-08 (×14): qty 1

## 2020-09-08 MED ORDER — QUETIAPINE FUMARATE 50 MG PO TABS: 50.0000 mg | ORAL_TABLET | Freq: Two times a day (BID) | ORAL | Status: DC

## 2020-09-08 MED ORDER — LORAZEPAM 2 MG/ML IJ SOLN: 2.0000 mg | Freq: Once | INTRAMUSCULAR | Status: AC

## 2020-09-08 NOTE — Progress Notes (Signed)
PROGRESS NOTE   Subjective/Complaints: Agitated, delusional behavior last night and still this morning. (see nurses note) visual hallucinations when I came in the room today  ROS: limited d/t cognition   Objective:   No results found. Recent Labs    09/06/20 0824  WBC 13.1*  HGB 10.7*  HCT 32.4*  PLT 259   Recent Labs    09/06/20 0824  NA 136  K 3.6  CL 106  CO2 22  GLUCOSE 135*  BUN 17  CREATININE 0.90  CALCIUM 8.6*    Intake/Output Summary (Last 24 hours) at 09/08/2020 0745 Last data filed at 09/07/2020 1840 Gross per 24 hour  Intake 660 ml  Output 850 ml  Net -190 ml        Physical Exam: Vital Signs Blood pressure (!) 160/80, pulse (!) 116, temperature 98 F (36.7 C), temperature source Oral, resp. rate 20, height 6\' 3"  (1.905 m), weight (!) 147.5 kg, SpO2 98 %. Constitutional: No distress . Vital signs reviewed. HEENT: EOMI, oral membranes moist Neck: supple Cardiovascular: RRR without murmur. No JVD    Respiratory/Chest: CTA Bilaterally without wheezes or rales. Normal effort    GI/Abdomen: BS +, non-tender, non-distended Ext: no clubbing, cyanosis, or edema Psych: confused, hallucinating, agitated Skin: sl drainage from an area or two on incision where he has picked/rubbed Musc: Mild left upper extremity edema.  . Neuro: Alert. Improved memory, awareness, insight Motor: LUE trace chest, biceps, 0/5 distally. 0/5 LLE--no motor changes Sensation diminished on left  Assessment/Plan: 1. Functional deficits which require 3+ hours per day of interdisciplinary therapy in a comprehensive inpatient rehab setting.  Physiatrist is providing close team supervision and 24 hour management of active medical problems listed below.  Physiatrist and rehab team continue to assess barriers to discharge/monitor patient progress toward functional and medical goals  Care Tool:  Bathing    Body parts bathed  by patient: Left arm,Chest,Abdomen,Front perineal area,Face   Body parts bathed by helper: Right arm,Buttocks,Right upper leg,Left upper leg,Right lower leg,Left lower leg     Bathing assist Assist Level: Dependent - Patient 0%     Upper Body Dressing/Undressing Upper body dressing   What is the patient wearing?: Pull over shirt    Upper body assist Assist Level: Minimal Assistance - Patient > 75%    Lower Body Dressing/Undressing Lower body dressing      What is the patient wearing?: Pants     Lower body assist Assist for lower body dressing: 2 Helpers (dep in stedy for STS with + 2 to power up)     Toileting Toileting    Toileting assist Assist for toileting: Total Assistance - Patient < 25%     Transfers Chair/bed transfer  Transfers assist  Chair/bed transfer activity did not occur: Safety/medical concerns  Chair/bed transfer assist level: Moderate Assistance - Patient 50 - 74% Chair/bed transfer assistive device: Sliding board   Locomotion Ambulation   Ambulation assist   Ambulation activity did not occur: Safety/medical concerns          Walk 10 feet activity   Assist  Walk 10 feet activity did not occur: Safety/medical concerns  Walk 50 feet activity   Assist Walk 50 feet with 2 turns activity did not occur: Safety/medical concerns         Walk 150 feet activity   Assist Walk 150 feet activity did not occur: Safety/medical concerns         Walk 10 feet on uneven surface  activity   Assist Walk 10 feet on uneven surfaces activity did not occur: Safety/medical concerns         Wheelchair     Assist Will patient use wheelchair at discharge?: Yes Type of Wheelchair: Manual Wheelchair activity did not occur: Safety/medical concerns         Wheelchair 50 feet with 2 turns activity    Assist    Wheelchair 50 feet with 2 turns activity did not occur: Safety/medical concerns       Wheelchair 150 feet  activity     Assist  Wheelchair 150 feet activity did not occur: Safety/medical concerns       Blood pressure (!) 160/80, pulse (!) 116, temperature 98 F (36.7 C), temperature source Oral, resp. rate 20, height 6\' 3"  (1.905 m), weight (!) 147.5 kg, SpO2 98 %. Medical Problem List and Plan: 1.  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   - Dr. Mickeal Skinner following. He will see pt at [facility] brain tumor clinic after discharge.   --Interdisciplinary Team Conference today    2.  Antithrombotics: -DVT/anticoagulation:  Pharmaceutical: Lovenox  -Dopplers 3/19 revealed thrombus in the left popliteal and peroneal veins. -IR has placed IVC filter   -antiplatelet therapy: N/A 3. Pain Management: Oxycodone prn. Will continue tylenol qid.  -continue HS topamax for headaches and to assist with sleep    - topamax to 50mg .  4. Mood/sleep:    -sleep chart  continue low dose seroquel 25mg  bid  Continue trazodone 100mg  qhs, continue melatonin  -continue xanax for anxiety 0.5mg  bid prn  -continue paxil 20mg  daily  Neuropsych evaluation today appreciated  3/30: very agitated and confused today   -?related to nuedxta---stopped today   -1mg  im ativan now   -consider enclosure bed if needed 5. Neuropsych: This patient is partially capable of making decisions on her own behalf.  Telesitter for safety 6. Skin/Wound Care: Routine pressure relief measures.  7. Fluids/Electrolytes/Nutrition:  encourage PO  -protein supp for low albumin 8. Hypotension: Monitor BP tid--off Cozaar at this time.   Remains soft, but asymptomatic on 3/29 9. ABLA due to UGIB s/p hemo clip:   Hemoglobin 9.8 on 3/24--->10.7 3/28    10 Leukocytosis:   -likely related to steroids and DVT. No fever, s/s infection currently  WBCs 15.8 on 3/24-->13.1 3/28  Afebrile  -3/24 UA  negative, ucx with insignificant growth   -tapering steroids--will decrease again later this week 11. Cerebral edema:   -CT findings appear to be what would be expected post-op  -steroids as above 12. 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   No breakthrough seizures noted on 3/27 13. Constipation:   Appears to be improving per documentation 14. Hyponatremia:   Sodium 135 on 3/24-->136 on 3/28    LOS: 12 days A FACE TO [place] 09/08/2020, 7:45 AM

## 2020-09-08 NOTE — Progress Notes (Signed)
Patient exhibiting new behaviors tonight, agitated, resistant to care from staff, and verbally threatening staff stating "Im warning you, don't come in my room, this is your last warning". No aggressive actions  towards staff once in the room. Patient has been pushing the call bell excessively and accusing staff of coming in his room. Staff was able to get vitals signs without incident. Upon entering room blood was observed to surgical site to right side of head and moderate amount of  drainage to bed, the patient appears to have been picking at scabbed over wound. Gauze dressing applied x2. First one taken off by patient. Second dressing now intact. Patient awake all night and can be heard talking to himself. Patient started on new medication nuedexta first dose 09/07/20 at 1438 and second dose given at 2133. Temp 98.3, BP 165/96, pulse 110, respirations 20, and O2 sat 100%.  On room air. Algis Liming PA notified. No new orders, continue to monitor.

## 2020-09-08 NOTE — Progress Notes (Signed)
Late note: Patient with psychosis with attempts at trying to throw himself out of bed as well as striking out at staff. Additional 2 mg ativan ineffective therefore 5 mg haldol given with de-escalation of behavior. Enclosure bed ordered for patient safety.  Nudexa discontinued and Seroquel increased to 50 mg bid. He has been sleeping most of afternoon-did arouse for family per nurse. To hold pm dose tonight if still asleep

## 2020-09-08 NOTE — Progress Notes (Signed)
Pt continues to have active hallucinations and agitation, attempted to stick head in between bed rails. wife called for pt to talk to w/o success in calming pt down. PA Pam notified of pt status, new order for 2mg  IM ativan noted and administered to l. Quad.

## 2020-09-08 NOTE — Progress Notes (Signed)
Patient aroused in attempt to obtain VS and administer medication by CN(Jessica G., RN) and assigned RN.Unsuccessful in obtaining a stable B/P at this time patient was becoming agitated, was able to administered most of his schedule medications,patient was not incontinent at this time, po liquids consumed with medications Monitor,  .  Marland Kitchen

## 2020-09-08 NOTE — Progress Notes (Signed)
Physical Therapy Session Note  Patient Details  Name: Todd Villa MRN: [id number] Date of Birth: January 13, 1971  Today's Date: 09/08/2020 PT Missed Time: 66 Minutes Missed Time Reason: Other (Comment) (Pt agitated and confused, then asleep following ativan administration)  Short Term Goals: Week 2:  PT Short Term Goal 1 (Week 2): Pt will perform bed mobility consistently with modA. PT Short Term Goal 2 (Week 2): Pt will perform bed to chair transfer consistently with modA. PT Short Term Goal 3 (Week 2): Pt will initiate gait training.  Skilled Therapeutic Interventions/Progress Updates:     Pt very agitated and confused in AM requiring dosage of ativan. In PM, pt asleep and in Posey bed. Will follow up as appropriate.  Therapy Documentation Precautions:  Precautions Precautions: None Restrictions Weight Bearing Restrictions: No LUE Weight Bearing: Non weight bearing RLE Weight Bearing: Weight bearing as tolerated LLE Weight Bearing: Non weight bearing General: PT Missed Treatment Reason: Other (Comment) (Pt agitated and confused, then asleep following ativan administration)    Therapy/Group: Individual Therapy  Breck Coons, PT, DPT 09/08/2020, 3:58 PM

## 2020-09-08 NOTE — Progress Notes (Signed)
Physical Therapy Note  Patient Details  Name: TYSIN SALADA MRN: [id number] Date of Birth: 1970/07/09 Today's Date: 09/08/2020    Patient asleep in secured enclosure bed upon PT arrival. Patient not responsive to verbal stimulation, heavily breathing and snoring. Discussed with RN, who reports patient did not sleep well and has had bouts of agitation last night and this morning. Patient missed 30 min of skilled PT due to fatigue, RN made aware. Will attempt to make-up missed time as able.      Lorianna Spadaccini L My [place] PT, DPT  09/08/2020, 4:50 PM

## 2020-09-08 NOTE — Discharge Instructions (Signed)
Inpatient Rehab Discharge Instructions  Todd Villa Discharge date and time:    Activities/Precautions/ Functional Status: Activity: no lifting, driving, or strenuous exercise for till cleared by MD Diet: regular diet Wound Care: keep wound clean and dry    Functional status:  ___ No restrictions     ___ Walk up steps independently ___ 24/7 supervision/assistance   ___ Walk up steps with assistance ___ Intermittent supervision/assistance  ___ Bathe/dress independently ___ Walk with walker     ___ Bathe/dress with assistance ___ Walk Independently    ___ Shower independently ___ Walk with assistance    ___ Shower with assistance ___ No alcohol     ___ Return to work/school ________  Special Instructions:    My questions have been answered and I understand these instructions. I will adhere to these goals and the provided educational materials after my discharge from the hospital.  Patient/Caregiver Signature _______________________________ Date __________  Clinician Signature _______________________________________ Date __________  Please bring this form and your medication list with you to all your follow-up doctor's appointments.

## 2020-09-08 NOTE — Progress Notes (Signed)
Speech Language Pathology Daily Session Note  Patient Details  Name: Todd Villa MRN: [id number] Date of Birth: 04-29-1971  Today's Date: 09/08/2020 SLP Individual Time: 0830-0900 SLP Individual Time Calculation (min): 30 min  Missed Time: 30 min, agitation   Short Term Goals: Week 2: SLP Short Term Goal 1 (Week 2): Pt will increase left field scanning to increase safety during functional activities (mobility, reading, writing, etc) provided min A verbal and visual cues SLP Short Term Goal 2 (Week 2): Patient will demonstrate functional problem solving for basic and familiar tasks with Mod A verbal and visual cues. SLP Short Term Goal 3 (Week 2): Patient will demonstrate sustained attention to functional tasks for ~10 minutes with Mod verbal cues for redirection. SLP Short Term Goal 4 (Week 2): Patient will orient to place, time and situation with Min verbal and visual cues. SLP Short Term Goal 5 (Week 2): Patient will identify 2 physical and 2 cognitive changes with Mod verbal cues.  Skilled Therapeutic Interventions: Skilled treatment session focused on cognitive goals. Upon arrival, patient was agitated while awake in bed. Patient was hyperverbal with language of confusion. Patient also threatening violence and attempting to grab anything within his reach. SLP attempted to provide basic care by refastening brief, donning a gown, etc without success. Patient appeared to recognize clinician but it was not helpful with calming the patient to allow care. SLP also called the patient's wife and that was also unsuccessful in claming patient. Patient's bed was pushed up against the wall to reduce fall risk with mats placed on the floor. RN made aware of confusion and agitation and administered medications. Patient left in bed with alarm on and telesitter present. Continue with current plan of care.      Pain No reports of pain   Therapy/Group: Individual Therapy  Rhoderick Farrel 09/08/2020,  2:26 PM

## 2020-09-08 NOTE — Progress Notes (Signed)
Late entry: Patient noted to be verbally agressive ,demanding and inappropriate with his , conversation difficulty in redirecting his conversations,repeating conversation over and over to staff.becoming more loud and degrading stating he would harm them.[place] monitor and,assisted with current plans. CN aware

## 2020-09-08 NOTE — Progress Notes (Signed)
Camera operatorLamount Cohen.,RN) spoke with Algis Liming, PA regarding current status. Patient observed asleep, respiration unlabored on room air, coloration adequate, no verbal response when name is called, monitor closely
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
## 2020-09-09 DIAGNOSIS — C719 Malignant neoplasm of brain, unspecified: Secondary | ICD-10-CM | POA: Diagnosis not present

## 2020-09-09 DIAGNOSIS — I82432 Acute embolism and thrombosis of left popliteal vein: Secondary | ICD-10-CM | POA: Diagnosis not present

## 2020-09-09 DIAGNOSIS — G441 Vascular headache, not elsewhere classified: Secondary | ICD-10-CM | POA: Diagnosis not present

## 2020-09-09 DIAGNOSIS — G8194 Hemiplegia, unspecified affecting left nondominant side: Secondary | ICD-10-CM | POA: Diagnosis not present

## 2020-09-09 LAB — GLUCOSE, CAPILLARY
Glucose-Capillary: 118 mg/dL — ABNORMAL HIGH (ref 70–99)
Glucose-Capillary: 128 mg/dL — ABNORMAL HIGH (ref 70–99)
Glucose-Capillary: 141 mg/dL — ABNORMAL HIGH (ref 70–99)
Glucose-Capillary: 118 mg/dL — ABNORMAL HIGH (ref 70–99)
Glucose-Capillary: 124 mg/dL — ABNORMAL HIGH (ref 70–99)

## 2020-09-09 NOTE — Progress Notes (Signed)
Enclosure bed discontinued, pt transferred to regular bed. Pt tolerating well. No complications noted. Family at bedside. Side rails up x3, call light in reach, needs addressed.  Sheela Stack, LPN

## 2020-09-09 NOTE — Progress Notes (Signed)
Physical Therapy Session Note  Patient Details  Name: Todd Villa MRN: [id number] Date of Birth: July 04, 1970  Today's Date: 09/09/2020 PT Individual Time: 1004-1059 PT Individual Time Calculation (min): 55 min   Short Term Goals: Week 2:  PT Short Term Goal 1 (Week 2): Pt will perform bed mobility consistently with modA. PT Short Term Goal 2 (Week 2): Pt will perform bed to chair transfer consistently with modA. PT Short Term Goal 3 (Week 2): Pt will initiate gait training.  Skilled Therapeutic Interventions/Progress Updates:     Pt received supine in posey bed with sister present. Agrees to therapy. Reports pain in buttocks. Number not provided. PT provides repositioning and mobility to manage pain. Supine to sit with modA and cues for sequencing and positioning. Pt demonstrates some activation of L lower extremity during transition. Pt performs slideboard transfer to [facility] with modA. Pt then performs sit to stand x2 with modA and blocking L knee. Pt sits suddenly during first bout due to backward LOB. Able to maintain improved balance on 2nd attempt and with cues for upright posture. WC transport outside for time management. Pt performs seated strengthening for bilateral upper extremities with 4 lb bar. PT ace wraps L hand on bar to facilitate grip. PT also provides tactile facilitation of L upper extremity movement. Pt completes 2x10 overhead presses, forward chest presses, and elbows extended arm raises. WC transport back inside. Pt appears very fatigued and has difficulty keeping eyes open. ModA for slideboard transfer back to bed and maxA for sit to supine. Left in posey bed with all needs within reach.  Therapy Documentation Precautions:  Precautions Precautions: None Restrictions Weight Bearing Restrictions: No LUE Weight Bearing: Non weight bearing RLE Weight Bearing: Weight bearing as tolerated LLE Weight Bearing: Non weight bearing    Therapy/Group: Individual  Therapy  Breck Coons, PT, DPT 09/09/2020, 4:21 PM

## 2020-09-09 NOTE — Progress Notes (Signed)
Speech Language Pathology Daily Session Note  Patient Details  Name: Todd Villa MRN: [id number] Date of Birth: 1970-12-08  Today's Date: 09/09/2020 SLP Individual Time: 0730-0825 SLP Individual Time Calculation (min): 55 min  Short Term Goals: Week 2: SLP Short Term Goal 1 (Week 2): Pt will increase left field scanning to increase safety during functional activities (mobility, reading, writing, etc) provided min A verbal and visual cues SLP Short Term Goal 2 (Week 2): Patient will demonstrate functional problem solving for basic and familiar tasks with Mod A verbal and visual cues. SLP Short Term Goal 3 (Week 2): Patient will demonstrate sustained attention to functional tasks for ~10 minutes with Mod verbal cues for redirection. SLP Short Term Goal 4 (Week 2): Patient will orient to place, time and situation with Min verbal and visual cues. SLP Short Term Goal 5 (Week 2): Patient will identify 2 physical and 2 cognitive changes with Mod verbal cues.  Skilled Therapeutic Interventions: Skilled treatment session focused on cognitive goals. Upon arrival, patient was asleep in the enclosure bed. Patient was easily awakened and appeared calm and responsive without any signs of agitation or aggression. Patient donned a clean gown and was repositioned in bed to maximize arousal and alertness for PO intake with patient able to follow all commands for bed mobility. SLP provided tray set-up and patient with minimal PO intake but did consume ~16 oz of liquid. Patient requested to call his wife. SLP dialed phone and patient  with appropriate interaction with one episode of liability. Patient asking appropriate questions and was oriented to time with Min verbal cues. Overall, patient appears to be back to his cognitive and behavioral baseline, therefore, recommend patient transfer back to a regular bed. RN aware. Patient left upright in enclosure bed with all needs within reach. Continue with current plan  of care.      Pain Pain Assessment Pain Scale: 0-10 Pain Score: 0-No pain  Therapy/Group: Individual Therapy  Calina Patrie 09/09/2020, 10:49 AM

## 2020-09-09 NOTE — Progress Notes (Signed)
Occupational Therapy Session Note  Patient Details  Name: Todd Villa MRN: [id number] Date of Birth: 1970/10/04  Today's Date: 09/09/2020 OT Individual Time: 1403-1500 OT Individual Time Calculation (min): 57 min   Short Term Goals: Week 2:  OT Short Term Goal 1 (Week 2): Pt will transfer wiht MOD A to BSC and LRAD OT Short Term Goal 2 (Week 2): Pt will don shirt with min A OT Short Term Goal 3 (Week 2): Pt will complete one step of LB dressing task  Skilled Therapeutic Interventions/Progress Updates:    Pt greeted semi-reclined in bed asleep, easy to wake, and agreeable to OT treatment session. PT intermittently falling asleep, but once seated EOB, was able to maintain alertness. Mod A to get to sitting EOB. Pt donned shorts with OT assist for LLE and min A to thread R side. Sit<>stand in [place] with mod A. Pt with posterior LOB when trying to pull up pants, requiring max A to safely return to sitting. Stood again in [place] and then pt was able to maintain standing balance while pulling up pants with min A and Stedy support. Stedy transfer over to wc. Wokred on UB bathing/dressing from wc at the sink. Pt appropriately turned on water and obtained wash cloths to wash face and upper body. OT provided hand over hand A to integrate L UE into bathing tasks. Reviewed use of L hand as a stabilizer to hold toothbrush while applying toothpaste-pt able to demonstrate understanding. Mod A for UB dressing using hemi techniques. Pt brought to therapy gym and worked on L UE NMR with joint input and good flexor activation! Pt noted to be falling asleep, so OT returned pt to room. Max A to get into standing in [place] from lower wc surface. Pt then returned to bed and left semi-reclined in bed with bed alarm on, call bell in reach, and needs met.   Therapy Documentation Precautions:  Precautions Precautions: None Restrictions Weight Bearing Restrictions: No LUE Weight Bearing: Non weight bearing RLE  Weight Bearing: Weight bearing as tolerated LLE Weight Bearing: Non weight bearing Pain:   denies pain  Therapy/Group: Individual Therapy  Valma Cava 09/09/2020, 2:57 PM

## 2020-09-09 NOTE — Progress Notes (Signed)
Pt continues to tolerate without enclosure bed. Pt resting comfortably, call light in reach, side rails up x3. Needs addressed. Family at bedside. No complications noted. Sheela Stack, LPN

## 2020-09-09 NOTE — Progress Notes (Signed)
PROGRESS NOTE   Subjective/Complaints: Continued agitation yesterday. Responded to haldol. Was placed in enclosure bed for safety. Had a good night for the most part. Pt alert, calm, sitting in bed when I entered. Headache under control.  ROS: Patient denies fever, rash, sore throat, blurred vision, nausea, vomiting, diarrhea, cough, shortness of breath or chest pain, joint or back pain    Objective:   No results found. No results for input(s): WBC, HGB, HCT, PLT in the last 72 hours. No results for input(s): NA, K, CL, CO2, GLUCOSE, BUN, CREATININE, CALCIUM in the last 72 hours.  Intake/Output Summary (Last 24 hours) at 09/09/2020 1034 Last data filed at 09/08/2020 1752 Gross per 24 hour  Intake 80 ml  Output --  Net 80 ml        Physical Exam: Vital Signs Blood pressure 116/78, pulse 93, temperature 98.2 F (36.8 C), temperature source Oral, resp. rate 17, height 6\' 3"  (1.905 m), weight (!) 147.5 kg, SpO2 99 %. Constitutional: No distress . Vital signs reviewed. HEENT: EOMI, oral membranes moist Neck: supple Cardiovascular: RRR without murmur. No JVD    Respiratory/Chest: CTA Bilaterally without wheezes or rales. Normal effort    GI/Abdomen: BS +, non-tender, non-distended Ext: no clubbing, cyanosis, or edema Psych: calm and appropriate today Skin: sl drainage from an area or two on incision where he has picked/rubbed Musc: Mild left upper extremity edema.  . Neuro: Alert. Remembered my name, oriented to person, place, reason. Motor: LUE 1+ chest, biceps, 0/5 distally. 1-2/5 HF/HE,KE 0/5 distally Sensation diminished on left  Assessment/Plan: 1. Functional deficits which require 3+ hours per day of interdisciplinary therapy in a comprehensive inpatient rehab setting.  Physiatrist is providing close team supervision and 24 hour management of active medical problems listed below.  Physiatrist and rehab team  continue to assess barriers to discharge/monitor patient progress toward functional and medical goals  Care Tool:  Bathing    Body parts bathed by patient: Left arm,Chest,Abdomen,Front perineal area,Face   Body parts bathed by helper: Right arm,Buttocks,Right upper leg,Left upper leg,Right lower leg,Left lower leg     Bathing assist Assist Level: Dependent - Patient 0%     Upper Body Dressing/Undressing Upper body dressing   What is the patient wearing?: Pull over shirt    Upper body assist Assist Level: Minimal Assistance - Patient > 75%    Lower Body Dressing/Undressing Lower body dressing      What is the patient wearing?: Pants     Lower body assist Assist for lower body dressing: 2 Helpers (dep in stedy for STS with + 2 to power up)     Toileting Toileting    Toileting assist Assist for toileting: Total Assistance - Patient < 25%     Transfers Chair/bed transfer  Transfers assist  Chair/bed transfer activity did not occur: Safety/medical concerns  Chair/bed transfer assist level: Moderate Assistance - Patient 50 - 74% Chair/bed transfer assistive device: Sliding board   Locomotion Ambulation   Ambulation assist   Ambulation activity did not occur: Safety/medical concerns          Walk 10 feet activity   Assist  Walk 10 feet activity did  not occur: Safety/medical concerns        Walk 50 feet activity   Assist Walk 50 feet with 2 turns activity did not occur: Safety/medical concerns         Walk 150 feet activity   Assist Walk 150 feet activity did not occur: Safety/medical concerns         Walk 10 feet on uneven surface  activity   Assist Walk 10 feet on uneven surfaces activity did not occur: Safety/medical concerns         Wheelchair     Assist Will patient use wheelchair at discharge?: Yes Type of Wheelchair: Manual Wheelchair activity did not occur: Safety/medical concerns         Wheelchair 50 feet  with 2 turns activity    Assist    Wheelchair 50 feet with 2 turns activity did not occur: Safety/medical concerns       Wheelchair 150 feet activity     Assist  Wheelchair 150 feet activity did not occur: Safety/medical concerns       Blood pressure 116/78, pulse 93, temperature 98.2 F (36.8 C), temperature source Oral, resp. rate 17, height 6\' 3"  (1.905 m), weight (!) 147.5 kg, SpO2 99 %. Medical Problem List and Plan: 1.  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   - Dr. Mickeal Skinner following. He will see pt at [facility] brain tumor clinic after discharge.   --Continue CIR therapies including PT, OT, and SLP     2.  Antithrombotics: -DVT/anticoagulation:  Pharmaceutical: Lovenox  -Dopplers 3/19 revealed thrombus in the left popliteal and peroneal veins. -IR has placed IVC filter   -antiplatelet therapy: N/A 3. Pain Management: Oxycodone prn. Will continue tylenol qid.  -continue HS topamax for headaches and to assist with sleep    - topamax to 50mg .  4. Mood/sleep:    -sleep chart  continue low dose seroquel 25mg  bid  Continue trazodone 100mg  qhs, continue melatonin  -continue xanax for anxiety 0.5mg  bid prn  -continue paxil 20mg  daily  Neuropsych evaluation today appreciated  3/30: very agitated and confused, ?d/t nuedexta  3/31: appears back to baseline   -dc enclosure bed   -spoke with Mrs Broxson re: behaviors 5. Neuropsych: This patient is partially capable of making decisions on her own behalf.  Telesitter for safety 6. Skin/Wound Care: scalp wound healing.  7. Fluids/Electrolytes/Nutrition:  encourage PO  -protein supp for low albumin 8. Hypotension: Monitor BP tid--off Cozaar at this time.   Remains soft, but asymptomatic on 3/29 9. ABLA due to UGIB s/p hemo clip:   Hemoglobin 9.8 on 3/24--->10.7  3/28    10 Leukocytosis:   -likely related to steroids and DVT. No fever, s/s infection currently  WBCs 15.8 on 3/24-->13.1 3/28  Afebrile  -3/24 UA negative, ucx with insignificant growth   -tapering steroids--will decrease to 2mg  daily 4/1 11. Cerebral edema:   -CT findings appear to be what would be expected post-op  -steroids as above 12. 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   No breakthrough seizures noted on 3/27 13. Constipation:   Appears to be improving per documentation 14. Hyponatremia:   Sodium 135 on 3/24-->136 on 3/28    LOS: 13 days A FACE TO [place] 09/09/2020, 10:34 AM

## 2020-09-10 ENCOUNTER — Other Ambulatory Visit: Payer: Self-pay | Admitting: *Deleted

## 2020-09-10 ENCOUNTER — Other Ambulatory Visit: Payer: Self-pay

## 2020-09-10 ENCOUNTER — Telehealth: Payer: Self-pay

## 2020-09-10 ENCOUNTER — Inpatient Hospital Stay
Admit: 2020-09-10 | Discharge: 2020-09-10 | Disposition: A | Discharge status: Home / Self Care | Payer: Self-pay | Attending: Internal Medicine | Admitting: Internal Medicine

## 2020-09-10 DIAGNOSIS — C719 Malignant neoplasm of brain, unspecified: Secondary | ICD-10-CM

## 2020-09-10 DIAGNOSIS — G441 Vascular headache, not elsewhere classified: Secondary | ICD-10-CM | POA: Diagnosis not present

## 2020-09-10 DIAGNOSIS — I82432 Acute embolism and thrombosis of left popliteal vein: Secondary | ICD-10-CM | POA: Diagnosis not present

## 2020-09-10 DIAGNOSIS — G8194 Hemiplegia, unspecified affecting left nondominant side: Secondary | ICD-10-CM | POA: Diagnosis not present

## 2020-09-10 LAB — GLUCOSE, CAPILLARY
Glucose-Capillary: 100 mg/dL — ABNORMAL HIGH (ref 70–99)
Glucose-Capillary: 113 mg/dL — ABNORMAL HIGH (ref 70–99)
Glucose-Capillary: 110 mg/dL — ABNORMAL HIGH (ref 70–99)
Glucose-Capillary: 144 mg/dL — ABNORMAL HIGH (ref 70–99)
Glucose-Capillary: 113 mg/dL — ABNORMAL HIGH (ref 70–99)

## 2020-09-10 MED ORDER — DEXAMETHASONE 2 MG PO TABS
2.0000 mg | ORAL_TABLET | Freq: Every day | ORAL | Status: DC
  Administered 2020-09-11 – 2020-09-14 (×4): 2 mg via ORAL
  Filled 2020-09-10 (×4): qty 1

## 2020-09-10 NOTE — Progress Notes (Signed)
Speech Language Pathology Weekly Progress and Session Note  Patient Details  Name: Todd Villa MRN: [id number] Date of Birth: [birth year]  Beginning of progress report period: September 03, 2020 End of progress report period: September 10, 2020  Today's Date: 09/10/2020 SLP Individual Time: 5038-8828 SLP Individual Time Calculation (min): 55 min  Short Term Goals: Week 2: SLP Short Term Goal 1 (Week 2): Pt will increase left field scanning to increase safety during functional activities (mobility, reading, writing, etc) provided min A verbal and visual cues SLP Short Term Goal 1 - Progress (Week 2): Met SLP Short Term Goal 2 (Week 2): Patient will demonstrate functional problem solving for basic and familiar tasks with Mod A verbal and visual cues. SLP Short Term Goal 2 - Progress (Week 2): Met SLP Short Term Goal 3 (Week 2): Patient will demonstrate sustained attention to functional tasks for ~10 minutes with Mod verbal cues for redirection. SLP Short Term Goal 3 - Progress (Week 2): Met SLP Short Term Goal 4 (Week 2): Patient will orient to place, time and situation with Min verbal and visual cues. SLP Short Term Goal 4 - Progress (Week 2): Met SLP Short Term Goal 5 (Week 2): Patient will identify 2 physical and 2 cognitive changes with Mod verbal cues. SLP Short Term Goal 5 - Progress (Week 2): Met    New Short Term Goals: Week 3: SLP Short Term Goal 1 (Week 3): Patient will identify 2 physical and 2 cognitive changes with Min verbal cues. SLP Short Term Goal 2 (Week 3): Patient will orient to place, time and situation with supervision verbal and visual cues. SLP Short Term Goal 3 (Week 3): Patient will demonstrate selective attention to functional tasks in a mildly disctrating enviornment for ~30 minutes with Min verbal cues for redirection. SLP Short Term Goal 4 (Week 3): Patient will demonstrate functional problem solving for basic and familiar tasks with Min A verbal and visual  cues. SLP Short Term Goal 5 (Week 3): Pt will increase left field scanning to increase safety during functional activities (mobility, reading, writing, etc) provided supervision A verbal and visual cues  Weekly Progress Updates: Patient continues to make excellent gains and has met 5 of 5 STGs this reporting period. Currently, patient requires overall Min-Mod A verbal cues to complete functional and familiar task safely in regards to problem solving, recall, attention and awareness. Patient continues to demonstrate intermittent lability and language of confusion but can be easily redirected. Patient and family education ongoing. Patient would benefit from continued skilled SLP intervention to maximize his cognitive functioning and overall functional independence prior to discharge.      Intensity: Minumum of 1-2 x/day, 30 to 90 minutes Frequency: 3 to 5 out of 7 days Duration/Length of Stay: 09/25/20 Treatment/Interventions: Cognitive remediation/compensation;Functional tasks;Cueing hierarchy;Therapeutic Activities;Therapeutic Exercise;Medication managment;Patient/family education;Internal/external aids   Daily Session  Skilled Therapeutic Interventions: Skilled treatment session focused on cognitive goals. SLP faciliated session by providing overall supervision level verbal cues for problem solving and left visual scanning during a basic calendar task. Patient also participated in a mildly complex calendar/appointment making task with Min verbal cues needed for problem solving and specificity. Overall, patient demonstrates improved topic maintenance, appropriate social interaction, less lability withou confusion today. [name] Continue with current plan of care.      Pain No/Denies Pain   Therapy/Group: Individual Therapy  Todd Villa 09/10/2020, 6:30 AM

## 2020-09-10 NOTE — Progress Notes (Addendum)
PROGRESS NOTE   Subjective/Complaints: Thursday went well. Feeling pretty good today also. Told me that headaches are manageable and that he slept. Happy that he's seen more movement in his left side!  ROS: Patient denies fever, rash, sore throat, blurred vision, nausea, vomiting, diarrhea, cough, shortness of breath or chest pain, joint or back pain, headache, or mood change.   Objective:   No results found. No results for input(s): WBC, HGB, HCT, PLT in the last 72 hours. No results for input(s): NA, K, CL, CO2, GLUCOSE, BUN, CREATININE, CALCIUM in the last 72 hours.  Intake/Output Summary (Last 24 hours) at 09/10/2020 1049 Last data filed at 09/10/2020 0851 Gross per 24 hour  Intake 660 ml  Output --  Net 660 ml        Physical Exam: Vital Signs Blood pressure 110/66, pulse 69, temperature 98.2 F (36.8 C), temperature source Oral, resp. rate 18, height 6\' 3"  (1.905 m), weight (!) 147.5 kg, SpO2 95 %. Constitutional: No distress . Vital signs reviewed. HEENT: EOMI, oral membranes moist Neck: supple Cardiovascular: RRR without murmur. No JVD    Respiratory/Chest: CTA Bilaterally without wheezes or rales. Normal effort    GI/Abdomen: BS +, non-tender, non-distended Ext: no clubbing, cyanosis, or edema Psych: pleasant and cooperative Skin: right scalp wound CDI. Fluid collection under skin noted Musc: Mild left upper extremity edema.  . Neuro: Alert. Remembered my name, oriented to person, place, reason. Motor: LUE 1+ chest, biceps, 0/5 distally. 1-2/5 HF/HE,KE, HAD, HAB, 0/5 distally Sensation remains diminished on left  Assessment/Plan: 1. Functional deficits which require 3+ hours per day of interdisciplinary therapy in a comprehensive inpatient rehab setting.  Physiatrist is providing close team supervision and 24 hour management of active medical problems listed below.  Physiatrist and rehab team continue to  assess barriers to discharge/monitor patient progress toward functional and medical goals  Care Tool:  Bathing    Body parts bathed by patient: Left arm,Chest,Abdomen,Front perineal area,Face   Body parts bathed by helper: Right arm,Buttocks,Right upper leg,Left upper leg,Right lower leg,Left lower leg     Bathing assist Assist Level: Dependent - Patient 0%     Upper Body Dressing/Undressing Upper body dressing   What is the patient wearing?: Pull over shirt    Upper body assist Assist Level: Minimal Assistance - Patient > 75%    Lower Body Dressing/Undressing Lower body dressing      What is the patient wearing?: Pants     Lower body assist Assist for lower body dressing: 2 Helpers (dep in stedy for STS with + 2 to power up)     Toileting Toileting    Toileting assist Assist for toileting: Total Assistance - Patient < 25%     Transfers Chair/bed transfer  Transfers assist  Chair/bed transfer activity did not occur: Safety/medical concerns  Chair/bed transfer assist level: Moderate Assistance - Patient 50 - 74% Chair/bed transfer assistive device: Sliding board   Locomotion Ambulation   Ambulation assist   Ambulation activity did not occur: Safety/medical concerns          Walk 10 feet activity   Assist  Walk 10 feet activity did not occur: Safety/medical concerns  Walk 50 feet activity   Assist Walk 50 feet with 2 turns activity did not occur: Safety/medical concerns         Walk 150 feet activity   Assist Walk 150 feet activity did not occur: Safety/medical concerns         Walk 10 feet on uneven surface  activity   Assist Walk 10 feet on uneven surfaces activity did not occur: Safety/medical concerns         Wheelchair     Assist Will patient use wheelchair at discharge?: Yes Type of Wheelchair: Manual Wheelchair activity did not occur: Safety/medical concerns         Wheelchair 50 feet with 2 turns  activity    Assist    Wheelchair 50 feet with 2 turns activity did not occur: Safety/medical concerns       Wheelchair 150 feet activity     Assist  Wheelchair 150 feet activity did not occur: Safety/medical concerns       Blood pressure 110/66, pulse 69, temperature 98.2 F (36.8 C), temperature source Oral, resp. rate 18, height 6\' 3"  (1.905 m), weight (!) 147.5 kg, SpO2 95 %. Medical Problem List and Plan: 1.  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   - Dr. Mickeal Skinner following. He will see pt at [facility] brain tumor clinic after discharge.   --Continue CIR therapies including PT, OT, and SLP. His proximal LLE motor return should help with his functional mobility     2.  Antithrombotics: -DVT/anticoagulation:  Pharmaceutical: Lovenox  -Dopplers 3/19 revealed thrombus in the left popliteal and peroneal veins. -IR has placed IVC filter   -antiplatelet therapy: N/A 3. Pain Management: Oxycodone prn. Will continue tylenol qid.  -continue HS topamax for headaches and to assist with sleep    - topamax to 50mg .  4. Mood/sleep:    -sleep chart  continue low dose seroquel 50 mg bid  Continue trazodone 100mg  qhs, continue melatonin  -continue xanax for anxiety 0.5mg  bid prn  -continue paxil 20mg  daily  Neuropsych evaluation today appreciated  3/30: very agitated and confused, ?d/t nuedexta  3/31-4/1: behavior back to baseline   -continue current meds   -team providing emotional support as needed 5. Neuropsych: This patient is partially capable of making decisions on her own behalf.  Telesitter for safety 6. Skin/Wound Care: scalp wound healing.  7. Fluids/Electrolytes/Nutrition:  encourage PO  -protein supp for low albumin 8. Hypotension: Monitor BP tid--off Cozaar at this time.   Remains soft, but asymptomatic on  4/1 9. ABLA due to UGIB s/p hemo clip:   Hemoglobin 9.8 on 3/24--->10.7 3/28  -recheck monday  10 Leukocytosis:   -likely related to steroids and DVT. No fever, s/s infection currently  WBCs 15.8 on 3/24-->13.1 3/28  Afebrile  -3/24 UA negative, ucx with insignificant growth  4/1-tapering steroids--will decrease to 2mg  daily today 4/1 11. Cerebral edema:   -CT findings appear to be what would be expected post-op  -steroids as above 12. 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   No breakthrough seizures noted on 3/27 13. Constipation:   Appears to be improving per documentation 14. Hyponatremia:   Sodium 135 on 3/24-->136 on 3/28  -recheck Monday   LOS: 14 days A FACE TO [place] 09/10/2020, 10:49  AM

## 2020-09-10 NOTE — Progress Notes (Signed)
Occupational Therapy Session Note  Patient Details  Name: Todd Villa MRN: [id number] Date of Birth: [birth year]  Today's Date: 09/10/2020 OT Individual Time: 1300-1355 OT Individual Time Calculation (min): 55 min   Short Term Goals: Week 3:  OT Short Term Goal 1 (Week 3): Patient will complete slideboard transfer to wheelchair with mod A OT Short Term Goal 2 (Week 3): Patient will complete sit<>stand at the sink in prep for BADL task with mod A of 1 OT Short Term Goal 3 (Week 3): Patient will recall hemi dressing techniuqes with min questioning cues  Skilled Therapeutic Interventions/Progress Updates:    Pt greeted asleep in bed, easy to wake and agreeable to OT treatment session. Pt thought he had been incontinent of urine, but when OT had pt roll to change brief, he had not been incontinent, but did have small smear of BM, so OT changed brief and washed buttocks. Worked on hip bridging to pull pants back up. OT support of L LE, but pt able to completely clear hips on both side and pull up pants. Pt able to actively move LLE Towards EOB and then push up into sitting. Slideboard transfer from bed to wc on R side with mod A.  Pt brought to therapy gym and attempted slideboard transfer from wc to therapy mat on weaker L side. Facilitation for head/hips relationship, but pt just pushing himself over. OT felt it was unsafe to continue transfer attempt. Pt able to scoot back into wc with min A and transfer completed towards R side with min A this time. Worked on L UE NMR in sitting with weight bearing bosu ball pushes. Functional use of L UE with seated hip extension to  Try to tap L foot onto number on floor. Pt noted to be falling asleep during activities. Pt completed slideboards from therapy mat>wc>bed with mod A. Upon last transfer back to bed, pt trying to lay down before fullin in bed and needed max cues and facilitation to maintain safe head/hips relatsionship. Pt left semi-reclined in bed  with bed alarm on, call bell in reach, and needs met.   Therapy Documentation Precautions:  Precautions Precautions: None Restrictions Weight Bearing Restrictions: No LUE Weight Bearing: Non weight bearing RLE Weight Bearing: Weight bearing as tolerated LLE Weight Bearing: Non weight bearing Pain:  denies pain  Therapy/Group: Individual Therapy  Valma Cava 09/10/2020, 1:59 PM

## 2020-09-10 NOTE — Telephone Encounter (Signed)
Request sent to [facility] radiology for 08/20/20 MRI Brain w/wo contrast to be sent to canopy/pacs. 08/17/20 Brain MRI was performed at [facility] and is available under imaging.

## 2020-09-10 NOTE — Progress Notes (Signed)
Nurse contacted PA Pam to initiate order for enclosure bed for pt safety concerns. PA Pam came to assess patient and instructed to order enclosure bed. Pt transferred to enclosure bed w/o issue.

## 2020-09-10 NOTE — Progress Notes (Signed)
Patient ID: Todd Villa, male   DOB: 10/09/1970, 50 y.o.   MRN: [id number]  SW received updates about pt upcoming radiation from [facility]. Upcoming appointments on 4/8 with Dr. Mickeal Skinner at [place], and 4/11 - radiation treatment  at [place] spoke with [facility] to discuss transportation to appointment in [place], reports they are unable to transport to [place]. SW updated attending with this information.   Loralee Pacas, MSW, [place] Office: [phone] Cell: [phone] Fax: [phone]

## 2020-09-10 NOTE — Progress Notes (Signed)
Occupational Therapy Weekly Progress Note  Patient Details  Name: Todd Villa MRN: [id number] Date of Birth: 1971-04-05  Beginning of progress report period: August 28, 2020 End of progress report period: September 10, 2020  Today's Date: 09/10/2020 OT Individual Time: 7741-2878 OT Individual Time Calculation (min): 57 min    Patient has met 2 of 3 short term goals.  Patient is making steady progress towards OT goals. Sit<>stands are improving as well as transfers. We started using a slideboard some for transfers to help decrease burden of care. Pt is able to slideboard transfer to the stronger R side with mod A, but stilll needs mod/max A to go to the L. L UE and LLE have demonstrated increased activation this week. Continue current POC.  Patient continues to demonstrate the following deficits: muscle weakness, decreased cardiorespiratoy endurance, impaired timing and sequencing, abnormal tone, unbalanced muscle activation, motor apraxia, decreased coordination and decreased motor planning, decreased midline orientation and decreased motor planning, decreased initiation, decreased attention, decreased awareness, decreased problem solving, decreased safety awareness, decreased memory and delayed processing and decreased sitting balance, decreased standing balance, decreased postural control, hemiplegia and decreased balance strategies and therefore will continue to benefit from skilled OT intervention to enhance overall performance with BADL and Reduce care partner burden.  Patient progressing toward long term goals..  Continue plan of care.  OT Short Term Goals Week 2:  OT Short Term Goal 1 (Week 2): Pt will transfer wiht MOD A to BSC and LRAD OT Short Term Goal 1 - Progress (Week 2): Progressing toward goal OT Short Term Goal 2 (Week 2): Pt will don shirt with min A OT Short Term Goal 2 - Progress (Week 2): Met OT Short Term Goal 3 (Week 2): Pt will complete one step of LB dressing task OT  Short Term Goal 3 - Progress (Week 2): Met Week 3:  OT Short Term Goal 1 (Week 3): Patient will complete slideboard transfer to wheelchair with mod A OT Short Term Goal 2 (Week 3): Patient will complete sit<>stand at the sink in prep for BADL task with mod A of 1 OT Short Term Goal 3 (Week 3): Patient will recall hemi dressing techniuqes with min questioning cues  Skilled Therapeutic Interventions/Progress Updates:    Pt greeted semi-reclined in bed with nurse tech finishing peri-care after bladder incontinence. Pt agreeable to shower today. Pt completed bed mobility with pt able to abduct L hip to help bring towards EOB. Sit<>stand in [place] with mod A, then transfer onto tub bench in shower. When going to stand to remove Stedy paddles, pts L foot slipped off of stedy, Pt was able to stand and step back up into stedy with L LE!!  Good initiation throughout shower to wash body parts. Hand over hand A to integrate L UE for neuro re-ed with activation noted from shoulder to help push and pull wash cloth. Worked on actively lifting LLE within dressing tasks to step into shorts. Sit<>stand without Stedy with mod A +2 for safety and L knee block. OT reviewed hemi-dressing techniques with pt able to demonstrate understanding with set-up and min verbal cues. Pt calm and showed no signs of agitation throughout session. Pt left seated in wc with alarm belt on, call bell in reach, and needs met.   Therapy Documentation Precautions:  Precautions Precautions: None Restrictions Weight Bearing Restrictions: No LUE Weight Bearing: Non weight bearing RLE Weight Bearing: Weight bearing as tolerated LLE Weight Bearing: Non weight bearing Pain: Denies pain  this morning  Therapy/Group: Individual Therapy  Valma Cava 09/10/2020, 8:34 AM

## 2020-09-11 DIAGNOSIS — R569 Unspecified convulsions: Secondary | ICD-10-CM | POA: Diagnosis not present

## 2020-09-11 DIAGNOSIS — G8194 Hemiplegia, unspecified affecting left nondominant side: Secondary | ICD-10-CM | POA: Diagnosis not present

## 2020-09-11 DIAGNOSIS — I82432 Acute embolism and thrombosis of left popliteal vein: Secondary | ICD-10-CM | POA: Diagnosis not present

## 2020-09-11 DIAGNOSIS — C719 Malignant neoplasm of brain, unspecified: Secondary | ICD-10-CM | POA: Diagnosis not present

## 2020-09-11 LAB — GLUCOSE, CAPILLARY
Glucose-Capillary: 94 mg/dL (ref 70–99)
Glucose-Capillary: 115 mg/dL — ABNORMAL HIGH (ref 70–99)
Glucose-Capillary: 111 mg/dL — ABNORMAL HIGH (ref 70–99)
Glucose-Capillary: 96 mg/dL (ref 70–99)

## 2020-09-11 NOTE — Progress Notes (Signed)
Speech Language Pathology Daily Session Note  Patient Details  Name: KEIVON GARDEN MRN: [id number] Date of Birth: 03-13-1971  Today's Date: 09/11/2020 SLP Individual Time: 0802-0830 SLP Individual Time Calculation (min): 28 min  Short Term Goals: Week 3: SLP Short Term Goal 1 (Week 3): Patient will identify 2 physical and 2 cognitive changes with Min verbal cues. SLP Short Term Goal 2 (Week 3): Patient will orient to place, time and situation with supervision verbal and visual cues. SLP Short Term Goal 3 (Week 3): Patient will demonstrate selective attention to functional tasks in a mildly disctrating enviornment for ~30 minutes with Min verbal cues for redirection. SLP Short Term Goal 4 (Week 3): Patient will demonstrate functional problem solving for basic and familiar tasks with Min A verbal and visual cues. SLP Short Term Goal 5 (Week 3): Pt will increase left field scanning to increase safety during functional activities (mobility, reading, writing, etc) provided supervision A verbal and visual cues  Skilled Therapeutic Interventions: Pt seen for skilled ST in room with focus on cognitive skills, pt states his morning medication was making him lethargic, benefiting from occasional cues to maintain alertness and increase participation. During temporal orientation task, pt independently scanning environment (R and L) for use of visual aids/assistance. Pt able to state correct MOY, year, planned d/c date and names of therapists, however required verbal cues to provide correct DOW and date. SLP facilitating simple problem solving task with focus on home environment by providing min A verbal cues to increase accuracy/safety of responses. Pt left in bed with alarm set and all needs within reach. Cont ST POC.   Pain Pain Assessment Pain Scale: 0-10 Pain Score: 1  Pain Location: Buttocks Pain Intervention(s): Repositioned  Therapy/Group: Individual Therapy  Dewaine Conger 09/11/2020, 8:24  AM

## 2020-09-11 NOTE — Progress Notes (Signed)
Occupational Therapy Session Note  Patient Details  Name: Todd Villa MRN: [id number] Date of Birth: 04-06-1971  Today's Date: 09/11/2020 OT Individual Time: 3295-1884 OT Individual Time Calculation (min): 54 min    Short Term Goals: Week 3:  OT Short Term Goal 1 (Week 3): Patient will complete slideboard transfer to wheelchair with mod A OT Short Term Goal 2 (Week 3): Patient will complete sit<>stand at the sink in prep for BADL task with mod A of 1 OT Short Term Goal 3 (Week 3): Patient will recall hemi dressing techniuqes with min questioning cues  Skilled Therapeutic Interventions/Progress Updates:    Pt received semi-reclined in bed with family present, agreeable to therapy. Denies pain.  Session focus on LUE NMR, core strength, upright posture, and problem solving in prep for improved ADL performance. Came to sitting EOB with mod A to progress LLE and to lift trunk. SB transfer bed > w/c on pt's R side with overall mod A to maintain upright posture on board. Completed oral care with close S and set-up of toothbrush at sink. Pt req to go outside for change of scenery and to enjoy the weather. Transport in w/c to and from outdoor space [facility]. Completed 1x10 of scapular elevation and retraction with significant support of LUE. Frequent verbal cues to maintain upright posture and cervical extension throughout session. Utilized UE ranger to complete massed practice of shoulder flexion and internal/external rotation. Noted small activation in shoulder to complete tasks. Additionally, facilitated LUE WB with 2x10 RUE cross body reaches. Finally, completed 1 visuoperceptual task with pegboard, pt overall req mod A to accurately complete. Pt is able to self-correct some mistakes when prompted, taking care to reference the design and counting spaces. Back in room, pt left in w/c at sink with family about to cut his hair. RN/NT notified of pt status and that family  will req A to return pt to bed.  Therapy Documentation Precautions:  Precautions Precautions: None Restrictions Weight Bearing Restrictions: No LUE Weight Bearing: Non weight bearing RLE Weight Bearing: Weight bearing as tolerated LLE Weight Bearing: Non weight bearing Pain: Pain Assessment Pain Scale: 0-10 Pain Score: 0-No pain ADL: See Care Tool for more details.   Therapy/Group: Individual Therapy  Volanda Napoleon MS, OTR/L  09/11/2020, 2:06 PM

## 2020-09-11 NOTE — Progress Notes (Incomplete)
Patient resting at interval throughout shift,cooperative make his needs know to staff, no agitated behaviors noted. Po liquids encouraged,Coloration adequate.,Temp. noted at 100 at 8p and monitored . Resting .

## 2020-09-11 NOTE — Progress Notes (Signed)
Physical Therapy Session Note  Patient Details  Name: Todd Villa MRN: [id number] Date of Birth: 09-09-1970  Today's Date: 09/11/2020 PT Individual Time: 0900-0945 PT Individual Time Calculation (min): 45 min   Short Term Goals: Week 2:  PT Short Term Goal 1 (Week 2): Pt will perform bed mobility consistently with modA. PT Short Term Goal 2 (Week 2): Pt will perform bed to chair transfer consistently with modA. PT Short Term Goal 3 (Week 2): Pt will initiate gait training.  Skilled Therapeutic Interventions/Progress Updates:     Pt received supine in bed and agrees to therapy. No report of pain. Pt performs bilateral rolling to don shorts with modA from PT. Supine to sit with bed features, logrolling technique, and modA. Pt performs sit to stand in [place] from elevated bed with modA and transfers to [facility] with [place]. WC transport to gym for time management. PT intends to have pt use litegait for bodyweight supported gait training. Pt stands to litegait with modA +2 as PT and tech strap pt into harness. Pt requires several seated rest breaks while donning harness and appears to be fatiguing, with some difficulty keeping eyes open and trunk drifting forward while seated. Pt is eventually able to stand and fully don harness, and practices weight bearing through bilateral lower extremities, with PT blocking L knee. Pt has difficulty holding head up and keeping eyes open while standing, and requests to return to room. WC transport back to room. Sit to stand in stedy with modA +2. Transfer back to bed with totalA and sit to supine with maxA +2 due to fatigue. Pt misses 15 minutes of skilled PT due to fatigue. Left supine with alarm intact and all needs within reach.  Therapy Documentation Precautions:  Precautions Precautions: None Restrictions Weight Bearing Restrictions: No LUE Weight Bearing: Non weight bearing RLE Weight Bearing: Weight bearing as tolerated LLE Weight Bearing: Non weight  bearing   Therapy/Group: Individual Therapy  Breck Coons, PT, DPT 09/11/2020, 9:54 AM

## 2020-09-11 NOTE — Progress Notes (Signed)
PROGRESS NOTE   Subjective/Complaints:   Patient reports he feels okay; he said he had some pretty significant pain/headache this morning but took something and feels better.   Last bowel movement was yesterday  ROS:  Pt denies SOB, abd pain, CP, N/V/C/D, and vision changes  Objective:   No results found. No results for input(s): WBC, HGB, HCT, PLT in the last 72 hours. No results for input(s): NA, K, CL, CO2, GLUCOSE, BUN, CREATININE, CALCIUM in the last 72 hours.  Intake/Output Summary (Last 24 hours) at 09/11/2020 1728 Last data filed at 09/11/2020 0448 Gross per 24 hour  Intake --  Output 525 ml  Net -525 ml        Physical Exam: Vital Signs Blood pressure 126/65, pulse 87, temperature 98.9 F (37.2 C), temperature source Oral, resp. rate 16, height 6\' 3"  (1.905 m), weight (!) 147.5 kg, SpO2 95 %.    General: awake, alert, appropriate, laying supine in bed; decreased spontaneous speech; NAD HENT: conjugate gaze; oropharynx moist-right head Crani-healing well-scalp wound looks better with less fluid collection CV: regular rate; no JVD Pulmonary: CTA B/L; no W/R/R- good air movement GI: soft, NT, ND, (+)BS Psychiatric: appropriate-decreased initiation but appropriate Neurological: alert Ext: no clubbing, cyanosis, or edema Skin: right scalp wound CDI. Fluid collection under skin noted Musc: Mild left upper extremity edema.  . Neuro: Alert. Remembered my name, oriented to person, place, reason. Motor: LUE 1+ chest, biceps, 0/5 distally. 1-2/5 HF/HE,KE, HAD, HAB, 0/5 distally Sensation remains diminished on left  Assessment/Plan: 1. Functional deficits which require 3+ hours per day of interdisciplinary therapy in a comprehensive inpatient rehab setting.  Physiatrist is providing close team supervision and 24 hour management of active medical problems listed below.  Physiatrist and rehab team continue to assess  barriers to discharge/monitor patient progress toward functional and medical goals  Care Tool:  Bathing    Body parts bathed by patient: Left arm,Chest,Abdomen,Front perineal area,Face   Body parts bathed by helper: Right arm,Buttocks,Right upper leg,Left upper leg,Right lower leg,Left lower leg     Bathing assist Assist Level: Dependent - Patient 0%     Upper Body Dressing/Undressing Upper body dressing   What is the patient wearing?: Pull over shirt    Upper body assist Assist Level: Minimal Assistance - Patient > 75%    Lower Body Dressing/Undressing Lower body dressing      What is the patient wearing?: Pants     Lower body assist Assist for lower body dressing: 2 Helpers (dep in stedy for STS with + 2 to power up)     Toileting Toileting    Toileting assist Assist for toileting: Total Assistance - Patient < 25%     Transfers Chair/bed transfer  Transfers assist  Chair/bed transfer activity did not occur: Safety/medical concerns  Chair/bed transfer assist level: Moderate Assistance - Patient 50 - 74% Chair/bed transfer assistive device: Sliding board   Locomotion Ambulation   Ambulation assist   Ambulation activity did not occur: Safety/medical concerns          Walk 10 feet activity   Assist  Walk 10 feet activity did not occur: Safety/medical concerns  Walk 50 feet activity   Assist Walk 50 feet with 2 turns activity did not occur: Safety/medical concerns         Walk 150 feet activity   Assist Walk 150 feet activity did not occur: Safety/medical concerns         Walk 10 feet on uneven surface  activity   Assist Walk 10 feet on uneven surfaces activity did not occur: Safety/medical concerns         Wheelchair     Assist Will patient use wheelchair at discharge?: Yes Type of Wheelchair: Manual Wheelchair activity did not occur: Safety/medical concerns         Wheelchair 50 feet with 2 turns  activity    Assist    Wheelchair 50 feet with 2 turns activity did not occur: Safety/medical concerns       Wheelchair 150 feet activity     Assist  Wheelchair 150 feet activity did not occur: Safety/medical concerns       Blood pressure 126/65, pulse 87, temperature 98.9 F (37.2 C), temperature source Oral, resp. rate 16, height 6\' 3"  (1.905 m), weight (!) 147.5 kg, SpO2 95 %. Medical Problem List and Plan: 1.  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   - Dr. Mickeal Skinner following. He will see pt at [facility] brain tumor clinic after discharge.   --Continue CIR therapies including PT, OT, and SLP. His proximal LLE motor return should help with his functional mobility     continue PT OT and SLP 2.  Antithrombotics: -DVT/anticoagulation:  Pharmaceutical: Lovenox  -Dopplers 3/19 revealed thrombus in the left popliteal and peroneal veins. -IR has placed IVC filter   -antiplatelet therapy: N/A 3. Pain Management: Oxycodone prn. Will continue tylenol qid.  -continue HS topamax for headaches and to assist with sleep    - topamax to 50mg .  4/2-took something for his headache this morning-continue regimen 4. Mood/sleep:    -sleep chart  continue low dose seroquel 50 mg bid  Continue trazodone 100mg  qhs, continue melatonin  -continue xanax for anxiety 0.5mg  bid prn  -continue paxil 20mg  daily  Neuropsych evaluation today appreciated  3/30: very agitated and confused, ?d/t nuedexta  3/31-4/1: behavior back to baseline   -continue current meds   -team providing emotional support as needed  4/2 -appears appropriate this morning-no agitation or confusion-  continue to monitor 5. Neuropsych: This patient is partially capable of making decisions on her own behalf.  Telesitter for safety 6. Skin/Wound Care: scalp wound healing.  7.  Fluids/Electrolytes/Nutrition:  encourage PO  -protein supp for low albumin 8. Hypotension: Monitor BP tid--off Cozaar at this time.   Remains soft, but asymptomatic on 4/1  4/2-blood pressure looks great at 126/75; continue to monitor 9. ABLA due to UGIB s/p hemo clip:   Hemoglobin 9.8 on 3/24--->10.7 3/28  -recheck monday  10 Leukocytosis:   -likely related to steroids and DVT. No fever, s/s infection currently  WBCs 15.8 on 3/24-->13.1 3/28  Afebrile  -3/24 UA negative, ucx with insignificant growth  4/1-tapering steroids--will decrease to 2mg  daily today 4/1 11. Cerebral edema:   -CT findings appear to be what would be expected post-op  -steroids as above 12. 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   4/2-no seizures in the last 24 hours-continue Keppra 13. Constipation:  Appears to be improving per documentation  4/2-last bowel movement yesterday; continue regimen 14. Hyponatremia:   Sodium 135 on 3/24-->136 on 3/28  -recheck Monday   LOS: 15 days A FACE TO FACE EVALUATION WAS PERFORMED  Todd Villa 09/11/2020, 5:28 PM

## 2020-09-12 DIAGNOSIS — I82432 Acute embolism and thrombosis of left popliteal vein: Secondary | ICD-10-CM | POA: Diagnosis not present

## 2020-09-12 DIAGNOSIS — C719 Malignant neoplasm of brain, unspecified: Secondary | ICD-10-CM | POA: Diagnosis not present

## 2020-09-12 DIAGNOSIS — G8194 Hemiplegia, unspecified affecting left nondominant side: Secondary | ICD-10-CM | POA: Diagnosis not present

## 2020-09-12 DIAGNOSIS — R569 Unspecified convulsions: Secondary | ICD-10-CM | POA: Diagnosis not present

## 2020-09-12 LAB — GLUCOSE, CAPILLARY
Glucose-Capillary: 163 mg/dL — ABNORMAL HIGH (ref 70–99)
Glucose-Capillary: 90 mg/dL (ref 70–99)
Glucose-Capillary: 119 mg/dL — ABNORMAL HIGH (ref 70–99)
Glucose-Capillary: 139 mg/dL — ABNORMAL HIGH (ref 70–99)
Glucose-Capillary: 106 mg/dL — ABNORMAL HIGH (ref 70–99)

## 2020-09-12 MED ORDER — DOXYCYCLINE HYCLATE 100 MG PO TABS
100.0000 mg | ORAL_TABLET | Freq: Two times a day (BID) | ORAL | Status: DC
  Administered 2020-09-12 – 2020-09-15 (×7): 100 mg via ORAL
  Filled 2020-09-12 (×8): qty 1

## 2020-09-12 NOTE — Progress Notes (Signed)
Nurse was called into patient's room due to excessive drainage noted on bed from craniotomy incision.  Upon assessment right temporal area noted to be soft and boggy, with serosanguineous drainage draining from a small area incision on the back of his head.  Patient states he doesn't recall scratching in and denies any pain or discomfort.  MD notified.

## 2020-09-12 NOTE — Progress Notes (Addendum)
PROGRESS NOTE   Subjective/Complaints:  Pt reports pain 0/10- bowels working OK- needs to be changed.  Addendum: Got called by nursing 5:45 pm- pt started having "a lot" of serosanguinous drainage (on sheet, appeared brown).  Have placed a call to [facility]-  Waiting to hear back.   ROS:   Pt denies SOB, abd pain, CP, N/V/C/D, and vision changes  Objective:   No results found. No results for input(s): WBC, HGB, HCT, PLT in the last 72 hours. No results for input(s): NA, K, CL, CO2, GLUCOSE, BUN, CREATININE, CALCIUM in the last 72 hours. No intake or output data in the 24 hours ending 09/12/20 1745      Physical Exam: Vital Signs Blood pressure (!) 105/59, pulse 70, temperature 98.1 F (36.7 C), temperature source Oral, resp. rate 19, height 6\' 3"  (1.905 m), weight (!) 147.5 kg, SpO2 98 %.     General: awake, alert, appropriate, sitting up slightly in bed; alittle sleepy, but appropriate; NAD HENT: conjugate gaze; oropharynx moist CV: regular rate; no JVD Pulmonary: CTA B/L; no W/R/R- good air movement GI: soft, NT, ND, (+)BS Psychiatric: appropriate- delayed responses, but same as yesterday Neurological: alert- inicsion looks about the same as yesterday- very slightly puffy- no drainage, no eryrthema  Ext: no clubbing, cyanosis, or edema Skin: right scalp wound CDI. Fluid collection under skin noted Musc: Mild left upper extremity edema.  . Neuro: Alert. Remembered my name, oriented to person, place, reason. Motor: LUE 1+ chest, biceps, 0/5 distally. 1-2/5 HF/HE,KE, HAD, HAB, 0/5 distally Sensation remains diminished on left  Assessment/Plan: 1. Functional deficits which require 3+ hours per day of interdisciplinary therapy in a comprehensive inpatient rehab setting.  Physiatrist is providing close team supervision and 24 hour management of active medical problems listed below.  Physiatrist and rehab team continue  to assess barriers to discharge/monitor patient progress toward functional and medical goals  Care Tool:  Bathing    Body parts bathed by patient: Left arm,Chest,Abdomen,Front perineal area,Face   Body parts bathed by helper: Right arm,Buttocks,Right upper leg,Left upper leg,Right lower leg,Left lower leg     Bathing assist Assist Level: Dependent - Patient 0%     Upper Body Dressing/Undressing Upper body dressing   What is the patient wearing?: Pull over shirt    Upper body assist Assist Level: Minimal Assistance - Patient > 75%    Lower Body Dressing/Undressing Lower body dressing      What is the patient wearing?: Pants     Lower body assist Assist for lower body dressing: 2 Helpers (dep in stedy for STS with + 2 to power up)     Toileting Toileting    Toileting assist Assist for toileting: Total Assistance - Patient < 25%     Transfers Chair/bed transfer  Transfers assist  Chair/bed transfer activity did not occur: Safety/medical concerns  Chair/bed transfer assist level: Moderate Assistance - Patient 50 - 74% Chair/bed transfer assistive device: Sliding board   Locomotion Ambulation   Ambulation assist   Ambulation activity did not occur: Safety/medical concerns          Walk 10 feet activity   Assist  Walk 10 feet activity  did not occur: Safety/medical concerns        Walk 50 feet activity   Assist Walk 50 feet with 2 turns activity did not occur: Safety/medical concerns         Walk 150 feet activity   Assist Walk 150 feet activity did not occur: Safety/medical concerns         Walk 10 feet on uneven surface  activity   Assist Walk 10 feet on uneven surfaces activity did not occur: Safety/medical concerns         Wheelchair     Assist Will patient use wheelchair at discharge?: Yes Type of Wheelchair: Manual Wheelchair activity did not occur: Safety/medical concerns         Wheelchair 50 feet with 2 turns  activity    Assist    Wheelchair 50 feet with 2 turns activity did not occur: Safety/medical concerns       Wheelchair 150 feet activity     Assist  Wheelchair 150 feet activity did not occur: Safety/medical concerns       Blood pressure (!) 105/59, pulse 70, temperature 98.1 F (36.7 C), temperature source Oral, resp. rate 19, height 6\' 3"  (1.905 m), weight (!) 147.5 kg, SpO2 98 %. Medical Problem List and Plan: 1.  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   - Dr. Mickeal Skinner following. He will see pt at [facility] brain tumor clinic after discharge.   --Continue CIR therapies including PT, OT, and SLP. His proximal LLE motor return should help with his functional mobility     4/3- con't PT, OT and SLP 2.  Antithrombotics: -DVT/anticoagulation:  Pharmaceutical: Lovenox  -Dopplers 3/19 revealed thrombus in the left popliteal and peroneal veins. -IR has placed IVC filter   -antiplatelet therapy: N/A 3. Pain Management: Oxycodone prn. Will continue tylenol qid.  -continue HS topamax for headaches and to assist with sleep    - topamax to 50mg .  4/2-took something for his headache this morning-continue regimen 4. Mood/sleep:    -sleep chart  continue low dose seroquel 50 mg bid  Continue trazodone 100mg  qhs, continue melatonin  -continue xanax for anxiety 0.5mg  bid prn  -continue paxil 20mg  daily  Neuropsych evaluation today appreciated  3/30: very agitated and confused, ?d/t nuedexta  3/31-4/1: behavior back to baseline   -continue current meds   -team providing emotional support as needed  4/2 -appears appropriate this morning-no agitation or confusion-  continue to monitor  4/3- much more appropriate this AM- con't regimen 5. Neuropsych: This patient is partially capable of making decisions on her own behalf.  Telesitter  for safety 6. Skin/Wound Care: scalp wound healing.  7. Fluids/Electrolytes/Nutrition:  encourage PO  -protein supp for low albumin 8. Hypotension: Monitor BP tid--off Cozaar at this time.   Remains soft, but asymptomatic on 4/1  4/2-blood pressure looks great at 126/75; continue to monitor  4/3- BP a little soft, but no orthostatic hypotension per nursing- con't to monitor 9. ABLA due to UGIB s/p hemo clip:   Hemoglobin 9.8 on 3/24--->10.7 3/28  -recheck monday  10 Leukocytosis:   -likely related to steroids and DVT. No fever, s/s infection currently  WBCs 15.8 on 3/24-->13.1 3/28  Afebrile  -3/24 UA negative, ucx with insignificant growth  4/1-tapering steroids--will decrease to 2mg  daily today 4/1  4/3- labs in AM 11. Cerebral edema:   -CT findings appear to be what would be expected post-op  -steroids as above 12. 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   4/2-no seizures in the last 24 hours-continue Keppra 13. Constipation:   Appears to be improving per documentation  4/2-last bowel movement yesterday; continue regimen 14. Hyponatremia:   Sodium 135 on 3/24-->136 on 3/28  -recheck Monday 15. Drainage from Crani site-  4/3- have called [facility]- waiting to hear back; per nurse, it's copious drainage, covered pillow/saturated. 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 After d/w pharmacy, they recommended Doxycycline 100mg  BID- is started    LOS: 16 days A FACE TO [place] 09/12/2020, 5:45 PM

## 2020-09-13 DIAGNOSIS — G479 Sleep disorder, unspecified: Secondary | ICD-10-CM | POA: Diagnosis not present

## 2020-09-13 DIAGNOSIS — G8194 Hemiplegia, unspecified affecting left nondominant side: Secondary | ICD-10-CM | POA: Diagnosis not present

## 2020-09-13 DIAGNOSIS — E871 Hypo-osmolality and hyponatremia: Secondary | ICD-10-CM | POA: Diagnosis not present

## 2020-09-13 DIAGNOSIS — C719 Malignant neoplasm of brain, unspecified: Secondary | ICD-10-CM | POA: Diagnosis not present

## 2020-09-13 LAB — BASIC METABOLIC PANEL
Anion gap: 8 (ref 5–15)
BUN: 14 mg/dL (ref 6–20)
CO2: 25 mmol/L (ref 22–32)
Calcium: 8.6 mg/dL — ABNORMAL LOW (ref 8.9–10.3)
Chloride: 102 mmol/L (ref 98–111)
Creatinine, Ser: 0.69 mg/dL (ref 0.61–1.24)
GFR, Estimated: >60 mL/min (ref >60)
Glucose, Bld: 87 mg/dL (ref 70–99)
Potassium: 3.6 mmol/L (ref 3.5–5.1)
Sodium: 135 mmol/L (ref 135–145)

## 2020-09-13 LAB — CBC
HCT: 33 % — ABNORMAL LOW (ref 39.0–52.0)
Hemoglobin: 10.7 g/dL — ABNORMAL LOW (ref 13.0–17.0)
MCH: 31 pg (ref 26.0–34.0)
MCHC: 32.4 g/dL (ref 30.0–36.0)
MCV: 95.7 fL (ref 80.0–100.0)
Platelets: 253 10*3/uL (ref 150–400)
RBC: 3.45 MIL/uL — ABNORMAL LOW (ref 4.22–5.81)
RDW: 16.5 % — ABNORMAL HIGH (ref 11.5–15.5)
WBC: 8.5 10*3/uL (ref 4.0–10.5)
nRBC: 0 % (ref 0.0–0.2)

## 2020-09-13 LAB — GLUCOSE, CAPILLARY
Glucose-Capillary: 104 mg/dL — ABNORMAL HIGH (ref 70–99)
Glucose-Capillary: 110 mg/dL — ABNORMAL HIGH (ref 70–99)
Glucose-Capillary: 86 mg/dL (ref 70–99)
Glucose-Capillary: 93 mg/dL (ref 70–99)
Glucose-Capillary: 98 mg/dL (ref 70–99)

## 2020-09-13 NOTE — Progress Notes (Signed)
Physical Therapy Weekly Progress Note  Patient Details  Name: Todd Villa MRN: [id number] Date of Birth: 1971-03-24  Beginning of progress report period: September 06, 2020 End of progress report period: September 13, 2020  Today's Date: 09/13/2020 PT Individual Time: 6015-6153 PT Individual Time Calculation (min): 55 min   Patient has met 0 of 3 short term goals.  Pt is progressing toward mobility goals, but was unable to meet short terms goals this week, partly due to missing day of therapy from medication side effects, and also having several days of significant drowsiness, impeding progress. Pt performing bed mobility at modA to maxA level, transfers with slideboard typically with modA but occasionally requiring maxA due to lack of trunk control, and pt unable to attempt gait at this point due to previously mentioned issues as well as significant weakness in L hemibody. Pt has recently shown increased neuromuscular return in L hemibody, however, and improving independence and efficiency with mobility.  Patient continues to demonstrate the following deficits muscle weakness, decreased cardiorespiratoy endurance, decreased motor planning, left side neglect, decreased safety awareness and decreased sitting balance, decreased standing balance, decreased postural control, hemiplegia and decreased balance strategies and therefore will continue to benefit from skilled PT intervention to increase functional independence with mobility.  Patient progressing toward long term goals..  Continue plan of care.  PT Short Term Goals Week 3:  PT Short Term Goal 1 (Week 3): STGs = LTGs  Skilled Therapeutic Interventions/Progress Updates:  Ambulation/gait training;Balance/vestibular training;Cognitive remediation/compensation;Community reintegration;Discharge planning;Disease management/prevention;DME/adaptive equipment instruction;Neuromuscular re-education;Functional mobility training;Functional electrical  stimulation;Pain management;Patient/family education;Skin care/wound management;Psychosocial support;Splinting/orthotics;Stair training;Therapeutic Activities;Therapeutic Exercise;UE/LE Coordination activities;Visual/perceptual remediation/compensation;Wheelchair propulsion/positioning;UE/LE Strength taining/ROM   Pt received supine in bed and agrees to therapy. No complaint of pain. PT cues pt to attempt bed mobility without manually assisting L lower extremity. PT is able to mobilize L leg off bed with cues on body mechanic and positioning, and transitions from sidelying to sitting with modA. Pt then performs sit to stand in [place] with modA. TotalA for transfer to [facility] with Stedy. Pt performs additional transfer from manual WC to Stedy to tilt in space WC. PT provides modA to attain neutral positioning of pelvis and hips in WC, and adjusts WC leg rests and head rest to allow for proper ergonomics/body mechanics. Pt left seated in WC with alarm intact and all needs within reach.  Therapy Documentation Precautions:  Precautions Precautions: None Restrictions Weight Bearing Restrictions: No LUE Weight Bearing: Non weight bearing RLE Weight Bearing: Weight bearing as tolerated LLE Weight Bearing: Non weight bearing  Therapy/Group: Individual Therapy  Breck Coons, PT, DPT 09/13/2020, 4:15 PM

## 2020-09-13 NOTE — Progress Notes (Signed)
Occupational Therapy Session Note  Patient Details  Name: Todd Villa MRN: [id number] Date of Birth: April 08, 1971  Today's Date: 09/13/2020 OT Individual Time: 1435-1531 OT Individual Time Calculation (min): 56 min   Short Term Goals: Week 3:  OT Short Term Goal 1 (Week 3): Patient will complete slideboard transfer to wheelchair with mod A OT Short Term Goal 2 (Week 3): Patient will complete sit<>stand at the sink in prep for BADL task with mod A of 1 OT Short Term Goal 3 (Week 3): Patient will recall hemi dressing techniuqes with min questioning cues  Skilled Therapeutic Interventions/Progress Updates:    Pt greeted semi-reclined in bed working on crossword puzzle and agreeable to OT treatment session. Pt wanted to shower today. Stedy used for sit<>stand and transfer into shower. OT issued LH sponge and pt was able to wash back and LEs. Pt needed OT assist to wash buttocks using lateral leans. Head was covered with shower cap to prevent incision from getting wet. OT also issued L wash mit and pt able to use L hand slightly to assist with bathing and for neuro re-ed. Stedy transfer out of shower to the EOB. Worked on hemi dressing strategies with pt able to dress UB with min A. Pt was able to thread R LE into shorts today. Sit<>stand at EOB with min A and L knee block, then pt able to pull up pants. Worked on lateral scooting along EOB L and R with focus on head/hips relationship. Practice scooting in prep for slideboard transfers. Sit<>stands at EOB and worked on taking step with LLE. Pt was able to step forward, but then had knee buckle with wieght shift and returned to supine. Pt's head incision draining heavily during EOB work and OT placed gauze, krilex, and used mesh underwear to secure dressing. Pt returned to bed at end of session and left semi-reclined in bed with bed alarm on, call bell in reach, and needs met.  Therapy Documentation Precautions:  Precautions Precautions:  None Restrictions Weight Bearing Restrictions: No LUE Weight Bearing: Non weight bearing RLE Weight Bearing: Weight bearing as tolerated LLE Weight Bearing: Non weight bearing Pain:  denies pain  Therapy/Group: Individual Therapy  Valma Cava 09/13/2020, 3:44 PM

## 2020-09-13 NOTE — Progress Notes (Addendum)
PROGRESS NOTE   Subjective/Complaints:  No new issues overnight. Headaches under control. Still with serosanguinous drainage from crani site. Afebrile  ROS: Patient denies fever, rash, sore throat, blurred vision, nausea, vomiting, diarrhea, cough, shortness of breath or chest pain, joint or back pain,  or mood change.      Objective:   No results found. Recent Labs    09/13/20 0801  WBC 8.5  HGB 10.7*  HCT 33.0*  PLT 253   Recent Labs    09/13/20 0801  NA 135  K 3.6  CL 102  CO2 25  GLUCOSE 87  BUN 14  CREATININE 0.69  CALCIUM 8.6*    Intake/Output Summary (Last 24 hours) at 09/13/2020 1220 Last data filed at 09/13/2020 0500 Gross per 24 hour  Intake --  Output 450 ml  Net -450 ml        Physical Exam: Vital Signs Blood pressure 120/74, pulse (!) 56, temperature 97.8 F (36.6 C), temperature source Oral, resp. rate 18, height 6\' 3"  (1.905 m), weight (!) 147.5 kg, SpO2 98 %.     Constitutional: No distress . Vital signs reviewed. HEENT: EOMI, oral membranes moist Neck: supple Cardiovascular: RRR without murmur. No JVD    Respiratory/Chest: CTA Bilaterally without wheezes or rales. Normal effort    GI/Abdomen: BS +, non-tender, non-distended Ext: no clubbing, cyanosis, or edema Psych: pleasant and cooperative Skin: right scalp wound still fluctuant, drainage from posterior aspects of incision where there is scab Musc: Mild left upper extremity edema.  . Neuro: alert, oriented to person, place, reason, name, month, year. Motor: LUE 1+ chest, biceps, tr/5 distally. 1-2/5 HF/HE,KE, HAD, HAB, 0/5 ADF, tr/5 APF Sensation remains diminished on left  Assessment/Plan: 1. Functional deficits which require 3+ hours per day of interdisciplinary therapy in a comprehensive inpatient rehab setting.  Physiatrist is providing close team supervision and 24 hour management of active medical problems listed  below.  Physiatrist and rehab team continue to assess barriers to discharge/monitor patient progress toward functional and medical goals  Care Tool:  Bathing    Body parts bathed by patient: Left arm,Chest,Abdomen,Front perineal area,Face   Body parts bathed by helper: Right arm,Buttocks,Right upper leg,Left upper leg,Right lower leg,Left lower leg     Bathing assist Assist Level: Dependent - Patient 0%     Upper Body Dressing/Undressing Upper body dressing   What is the patient wearing?: Pull over shirt    Upper body assist Assist Level: Minimal Assistance - Patient > 75%    Lower Body Dressing/Undressing Lower body dressing      What is the patient wearing?: Pants     Lower body assist Assist for lower body dressing: 2 Helpers (dep in stedy for STS with + 2 to power up)     Toileting Toileting    Toileting assist Assist for toileting: Total Assistance - Patient < 25%     Transfers Chair/bed transfer  Transfers assist  Chair/bed transfer activity did not occur: Safety/medical concerns  Chair/bed transfer assist level: Moderate Assistance - Patient 50 - 74% Chair/bed transfer assistive device: Sliding board   Locomotion Ambulation   Ambulation assist   Ambulation activity did not occur:  Safety/medical concerns          Walk 10 feet activity   Assist  Walk 10 feet activity did not occur: Safety/medical concerns        Walk 50 feet activity   Assist Walk 50 feet with 2 turns activity did not occur: Safety/medical concerns         Walk 150 feet activity   Assist Walk 150 feet activity did not occur: Safety/medical concerns         Walk 10 feet on uneven surface  activity   Assist Walk 10 feet on uneven surfaces activity did not occur: Safety/medical concerns         Wheelchair     Assist Will patient use wheelchair at discharge?: Yes Type of Wheelchair: Manual Wheelchair activity did not occur: Safety/medical  concerns         Wheelchair 50 feet with 2 turns activity    Assist    Wheelchair 50 feet with 2 turns activity did not occur: Safety/medical concerns       Wheelchair 150 feet activity     Assist  Wheelchair 150 feet activity did not occur: Safety/medical concerns       Blood pressure 120/74, pulse (!) 56, temperature 97.8 F (36.6 C), temperature source Oral, resp. rate 18, height 6\' 3"  (1.905 m), weight (!) 147.5 kg, SpO2 98 %. Medical Problem List and Plan: 1.  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   - Dr. Mickeal Skinner following. He will see pt at [facility] brain [facility] clinic after discharge. Eval scheduled on Friday. Has simulation on Monday. Spoke with team, wife, Dr. Mickeal Skinner  Regarding potential plan. Consider earlier discharge so that he can have treatments in [place]. Perhaps dc 09/20/20  --Continue CIR therapies including PT, OT, and SLP.  2.  Antithrombotics: -DVT/anticoagulation:  Pharmaceutical: Lovenox  -Dopplers 3/19 revealed thrombus in the left popliteal and peroneal veins. -IR has placed IVC filter   -antiplatelet therapy: N/A 3. Pain Management: Oxycodone prn. Will continue tylenol qid.  -continue HS topamax for headaches and to assist with sleep    - topamax to 50mg .  4/2-took something for his headache this morning-continue regimen 4. Mood/sleep:    -sleep chart  continue low dose seroquel 50 mg bid  Continue trazodone 100mg  qhs, continue melatonin  -continue xanax for anxiety 0.5mg  bid prn  -continue paxil 20mg  daily  Neuropsych evaluation today appreciated  4/4 behavior stable, continue current meds.   -occasionally is emotionally labile but episodes are limited 5. Neuropsych: This patient is partially capable of making decisions on her own behalf.  Telesitter for safety 6. Skin/Wound Care:  scalp wound healing.  7. Fluids/Electrolytes/Nutrition:  encourage PO  -protein supp for low albumin 8. Hypotension: Monitor BP tid--off Cozaar at this time.   4/4 soft/ normal 9. ABLA due to UGIB s/p hemo clip:   Hemoglobin 9.8 on 3/24--->10.7 3/28 and again 4/4  10 Leukocytosis:   -likely related to steroids and DVT. No fever, s/s infection currently  WBCs 15.8 on 3/24-->13.1 3/28--> 8.5 4/4  Decadron 2mg  daily 11. Cerebral edema:   -CT findings appear to be what would be expected post-op  -steroids as above 12. 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   4/2-no seizures in the last 24 hours-continue Keppra 13. Constipation:  Appears to be improving per documentation  4/2-last bowel movement yesterday; continue regimen 14. Hyponatremia:   Sodium 135 on 3/24-->136 on 3/28-->135 4/4   15. Drainage from Crani site-  4/4-wound still draining. Does not appear infectious   -continue doxycycline, tolerating it without issue   -continue dry dressing for absorption   -spoke with Dr. Jonathon Jordan nurse. Dr. Lacinda Axon to call back    Greater than 35 total minutes was spent in examination of patient, assessment of pertinent data,  formulation of a treatment plan, and in discussion with patient and/or family.     LOS: 17 days A FACE TO [place] 09/13/2020, 12:20 PM

## 2020-09-13 NOTE — Progress Notes (Signed)
Patient ID: Todd Villa, male   DOB: 07/02/1970, 50 y.o.   MRN: [id number]  SW received phone call from pt wife Maudie Mercury inquiring about upcoming appt, and if she iwll need to take time off for his appointment on Friday and she understands we are unable to transport to [place]. SW informed attending was going to speak with Dr. Mickeal Skinner to see what options were available, and we will follow-up.   Per attending, pt will now discharge on 4/11 to allow for pt to begin radiation treatment in [place]. Dr. Mickeal Skinner to see pt here for his initial appointment.   Loralee Pacas, MSW, [place] Office: [phone] Cell: [phone] Fax: [phone]

## 2020-09-13 NOTE — Progress Notes (Signed)
Pt's head dressing with  moderate amount of serosanguinous drainage. Dressing removed suture line appears normal with approximated edges from anterior line the posterior line at the end has the drainage present. The  area in the right  parietal area of the skull is soft and boggy to the touch. Pt does not complain of any new pain and remains afebrile. New DSD applied with lightly applied ace wrap to keep bandage on and provide gentle pressure to site.

## 2020-09-13 NOTE — Progress Notes (Signed)
Pt's dressing to head remains clean dry and intact. Pt was medicated for slight H/A. Treatment deferred tonight due to copious drainage today.

## 2020-09-13 NOTE — Progress Notes (Signed)
Speech Language Pathology Daily Session Note  Patient Details  Name: Todd Villa MRN: [id number] Date of Birth: May 23, 1971  Today's Date: 09/13/2020 SLP Individual Time: 1224-8250 SLP Individual Time Calculation (min): 55 min  Short Term Goals: Week 3: SLP Short Term Goal 1 (Week 3): Patient will identify 2 physical and 2 cognitive changes with Min verbal cues. SLP Short Term Goal 2 (Week 3): Patient will orient to place, time and situation with supervision verbal and visual cues. SLP Short Term Goal 3 (Week 3): Patient will demonstrate selective attention to functional tasks in a mildly disctrating enviornment for ~30 minutes with Min verbal cues for redirection. SLP Short Term Goal 4 (Week 3): Patient will demonstrate functional problem solving for basic and familiar tasks with Min A verbal and visual cues. SLP Short Term Goal 5 (Week 3): Pt will increase left field scanning to increase safety during functional activities (mobility, reading, writing, etc) provided supervision A verbal and visual cues  Skilled Therapeutic Interventions: Skilled treatment session focused on cognitive goals.  SLP facilitated session by providing extra time and overall Min A verbal cues for functional problem solving during a basic money management task. Patient demonstrated overall sustained attention to task for ~40 minutes with Mod I and was independently oriented X 4  . Patient also with appropriate interactions throughout session without confusion or liability noted. Overall, patient continues to make excellent gains. Patient left upright in bed with alarm on and all needs within reach. Continue with current plan of care.      Pain No/Denies Pain   Therapy/Group: Individual Therapy  Todd Villa 09/13/2020, 2:34 PM

## 2020-09-14 DIAGNOSIS — I82432 Acute embolism and thrombosis of left popliteal vein: Secondary | ICD-10-CM | POA: Diagnosis not present

## 2020-09-14 DIAGNOSIS — G8194 Hemiplegia, unspecified affecting left nondominant side: Secondary | ICD-10-CM | POA: Diagnosis not present

## 2020-09-14 DIAGNOSIS — C719 Malignant neoplasm of brain, unspecified: Secondary | ICD-10-CM | POA: Diagnosis not present

## 2020-09-14 DIAGNOSIS — G441 Vascular headache, not elsewhere classified: Secondary | ICD-10-CM | POA: Diagnosis not present

## 2020-09-14 LAB — GLUCOSE, CAPILLARY
Glucose-Capillary: 110 mg/dL — ABNORMAL HIGH (ref 70–99)
Glucose-Capillary: 119 mg/dL — ABNORMAL HIGH (ref 70–99)
Glucose-Capillary: 163 mg/dL — ABNORMAL HIGH (ref 70–99)
Glucose-Capillary: 82 mg/dL (ref 70–99)
Glucose-Capillary: 84 mg/dL (ref 70–99)
Glucose-Capillary: 90 mg/dL (ref 70–99)

## 2020-09-14 MED ORDER — DOXYCYCLINE HYCLATE 100 MG PO TABS: 100.0000 mg | ORAL_TABLET | Freq: Two times a day (BID) | ORAL | Status: DC

## 2020-09-14 MED ORDER — MELATONIN 3 MG PO TABS: 3.0000 mg | ORAL_TABLET | Freq: Every day | ORAL | 0 refills | Status: DC

## 2020-09-14 MED ORDER — QUETIAPINE FUMARATE 50 MG PO TABS: 50.0000 mg | ORAL_TABLET | Freq: Two times a day (BID) | ORAL | Status: DC

## 2020-09-14 MED ORDER — BACITRACIN ZINC 500 UNIT/GM EX OINT: TOPICAL_OINTMENT | Freq: Two times a day (BID) | CUTANEOUS | 0 refills | Status: DC

## 2020-09-14 MED ORDER — TRAZODONE HCL 100 MG PO TABS: 100.0000 mg | ORAL_TABLET | Freq: Every day | ORAL | Status: DC

## 2020-09-14 MED ORDER — ALPRAZOLAM 0.5 MG PO TABS: 0.5000 mg | ORAL_TABLET | Freq: Two times a day (BID) | ORAL | 0 refills | Status: DC | PRN

## 2020-09-14 MED ORDER — TOPIRAMATE 50 MG PO TABS: 50.0000 mg | ORAL_TABLET | Freq: Every day | ORAL | Status: DC

## 2020-09-14 NOTE — Progress Notes (Signed)
Patient ID: Todd Villa, male   DOB: [birth year], 50 y.o.   MRN: [id number]  Per attending and PA, No updates from [place] on if there is a bed available.   Loralee Pacas, MSW, [employer] Office: [phone] Cell: [phone] Fax: [phone]

## 2020-09-14 NOTE — Progress Notes (Addendum)
Discussed case further with the rehab team as well as Dr. Lacinda Axon. Mrs Neels upset that no bed currently available at [facility]. She intended to take him to [facility] ED on her own if a bed didn't become available today. Physical therapy attempted a car transfer with patient this afternoon and two therapists struggled to get him in. I, the rehab team, and Dr. Lacinda Axon don't feel that it's safe for Mrs. Mroczkowski to take him on her own. Given that further delays will only delay his radiation therapy, ultimately we felt it was appropriate to seek transport through [facility] to bring him to [facility] ED if in fact that is an option. Social work to notify Mrs Janes of plan.   Meredith Staggers, MD, [place] [facility] 09/14/2020

## 2020-09-14 NOTE — Patient Care Conference (Signed)
Inpatient RehabilitationTeam Conference and Plan of Care Update Date: 09/14/2020   Time: 10:39 AM    Patient Name: Todd Villa      Medical Record Number: [id number]  Date of Birth: November 27, 1970 Sex: Male         Room/Bed: 4W12C/4W12C-02 Payor Info: Payor: Theme park manager / Plan: [employer] PPO / Product Type: *No Product type* /    Admit Date/Time:  08/27/2020 11:42 AM  Primary Diagnosis:  Glioblastoma multiforme of brain [facility])  Hospital Problems: Principal Problem:   Glioblastoma multiforme of brain ([place]) Active Problems:   Acute deep vein thrombosis (DVT) of popliteal vein of left lower extremity (HCC)   Vascular headache   Sleep disorder   Left hemiparesis ([place])   Sleep disturbance   Hyponatremia   New onset seizure ([place])   Slow transit constipation    Expected Discharge Date: Expected Discharge Date: 09/15/20  Team Members Present: Physician leading conference: Dr. Alger Simons Care Coodinator Present: Loralee Pacas, LCSWA;Wing Schoch Creig Hines, RN, BSN, CRRN Nurse Present: Dorthula Nettles, RN PT Present: Apolinar Junes, PT OT Present: Cherylynn Ridges, OT SLP Present: Weston Anna, SLP PPS Coordinator present : Gunnar Fusi, SLP     Current Status/Progress Goal Weekly Team Focus  Bowel/Bladder   pt is incontinent of B/B  condom cath at night LBM 04/03  pt will have more episodes of continence  timed toilet   Swallow/Nutrition/ Hydration             ADL's   Min/mod A sit<>stands, mod A slideboard transfers, mod/max A LB self-care min/mod A UB self-care  min A, some supervision level  L NMR family education, dc planning, sit<>stands, transfers   Mobility   modA supine<>sit, minA/modA sit to stand depending on fatigue, modA slideboard transfers. improving L hemibody NM return.  minA/modA. probably need to DC gait goals.  family ed, stand pivot and slideboard transfers, L hemibody NMR, DC prep.   Communication             Safety/Cognition/ Behavioral  Observations  Supervision-Min A  Supervision-Mod I  compex problem solving, awareness, recall   Pain   (P) H/A ranging from mild to moderate tylenol and Oxycodone IR prn  pain <=2/10  assess pain qshift and prn   Skin   craniotomy is having moderate amount of serosanguious drainage from distal part of suture line. The area laterlal to incision is boggy and soft to the touch. pt has bruises to forearms  Incisions drainage will resolve, wound will continue to heal without infection or complication. Skin will remain intact  Assess skin and wounds qshift and prn     Discharge Planning:      Team Discussion: Will need to go back to [facility] to have surgical site washout and re-close. Incontinent B/B, pain 8/10 most times, medication given appropriately, incision still leaking but patient is returning to [facility]. Education on HTN, wound care, safety precautions. Wife to be the primary caregiver. Will begin on getting patient transferred back to [facility]. Patient on target to meet rehab goals: Yes, been working on getting transfers at a manageable level, will discontinue gait goals. Will continue to work on car transfers until patient transfers back to [facility]. Speech has been improving, cognition has been improving. Will need to do some family education ASAP.  *See Care Plan and progress notes for long and short-term goals.   Revisions to Treatment Plan:  Patient will transfer back to [facility] for surgical washout.  Teaching Needs: Family education, medication management,  pain management, skin/wound care, safety awareness, transfer training, car transfer training, balance training, endurance training.  Current Barriers to Discharge: Decreased caregiver support, Medical stability, Home enviroment access/layout, Incontinence, Neurogenic bowel and bladder, Wound care, Lack of/limited family support, Weight, Medication compliance, Pending surgery, Pending chemo/radiation and Behavior  Possible Resolutions to  Barriers: Continue current medications, provide emotional support.     Medical Summary Current Status: flap with drainage. on prophylactic abx. pain better. cognition improved. motor return  Barriers to Discharge: Medical stability   Possible Resolutions to Barriers/Weekly Focus: pt will need transfer back to [facility] for surgical washout   Continued Need for Acute Rehabilitation Level of Care: The patient requires daily medical management by a physician with specialized training in physical medicine and rehabilitation for the following reasons: Direction of a multidisciplinary physical rehabilitation program to maximize functional independence : Yes Medical management of patient stability for increased activity during participation in an intensive rehabilitation regime.: Yes Analysis of laboratory values and/or radiology reports with any subsequent need for medication adjustment and/or medical intervention. : Yes   I attest that I was present, lead the team conference, and concur with the assessment and plan of the team.   Cristi Loron 09/14/2020, 3:58 PM

## 2020-09-14 NOTE — Progress Notes (Signed)
Occupational Therapy Session Note  Patient Details  Name: Todd Villa MRN: [id number] Date of Birth: 1970-09-04  Today's Date: 09/14/2020 OT Individual Time: 3112-1624 OT Individual Time Calculation (min): 55 min    Short Term Goals: Week 3:  OT Short Term Goal 1 (Week 3): Patient will complete slideboard transfer to wheelchair with mod A OT Short Term Goal 2 (Week 3): Patient will complete sit<>stand at the sink in prep for BADL task with mod A of 1 OT Short Term Goal 3 (Week 3): Patient will recall hemi dressing techniuqes with min questioning cues  Skilled Therapeutic Interventions/Progress Updates:    Pt greeted semi-reclined in bed finishing breakfast and agreeable to OT treatment session. Pt did well self-feeding using one handed strategies. Pt completed bed mobility with min A to advance L LE, then he was able to power into sitting using railings. Dressing completed from EOB with focus on hemi dressing techniques. Pt was almost able to thread LLE today, but still needed OT assist. Mod A sit<>stand from EOB, then min A to pull up pants. UB dressing with assist to thread L UE 2/2 long sleeves, then pt able to complete the rest of task. Worked on stand-pivot transfer to stronger R side with min/mod A. Pt completed grooming tasks at the sink with min cues to use L hand as a stabilizer. Pt brought to therapy gym for blocked practice of squat pivot transfers to the L and R. After demonstration and practice scooting along mat, pt demonstrated great understanding of head/hips relationship with squat-pivots. Pt able to squat-pivot L and R with min/mod A!! Practiced many times with pt improving each transfer. Pt returned to room and left seated in TIS wc with alarm belt on, L UE supported, call bell in reach, and needs met.   Therapy Documentation Precautions:  Precautions Precautions: None Restrictions Weight Bearing Restrictions: No LUE Weight Bearing: Non weight bearing RLE Weight  Bearing: Weight bearing as tolerated LLE Weight Bearing: Non weight bearing Pain: Denies pain  Therapy/Group: Individual Therapy  Valma Cava 09/14/2020, 8:40 AM

## 2020-09-14 NOTE — Progress Notes (Signed)
PROGRESS NOTE   Subjective/Complaints:  Pt with quiet evening. Still with significant drainage from crani incision. Area being kept covered with gauze, dressing. Pain, headaches under control  ROS: Patient denies fever, rash, sore throat, blurred vision, nausea, vomiting, diarrhea, cough, shortness of breath or chest pain, joint or back pain,  or mood change.     Objective:   No results found. Recent Labs    09/13/20 0801  WBC 8.5  HGB 10.7*  HCT 33.0*  PLT 253   Recent Labs    09/13/20 0801  NA 135  K 3.6  CL 102  CO2 25  GLUCOSE 87  BUN 14  CREATININE 0.69  CALCIUM 8.6*    Intake/Output Summary (Last 24 hours) at 09/14/2020 1114 Last data filed at 09/14/2020 2703 Gross per 24 hour  Intake --  Output 600 ml  Net -600 ml        Physical Exam: Vital Signs Blood pressure 122/71, pulse (!) 58, temperature 97.8 F (36.6 C), temperature source Oral, resp. rate 16, height 6\' 3"  (1.905 m), weight (!) 147.5 kg, SpO2 96 %.     Constitutional: No distress . Vital signs reviewed. HEENT: EOMI, oral membranes moist Neck: supple Cardiovascular: RRR without murmur. No JVD    Respiratory/Chest: CTA Bilaterally without wheezes or rales. Normal effort    GI/Abdomen: BS +, non-tender, non-distended Ext: no clubbing, cyanosis, or edema Psych: pleasant and cooperative Skin: right crani incision with large amounts of serosanginous drainage from posterior aspect. Area appears clean, no odor, non-tender.  Musc: Mild left upper extremity edema.  . Neuro: alert, oriented to person, place, reason, name, month, year. Motor: LUE 1+ chest, biceps, tr/5 distally. 1-2/5 HF/HE,KE, HAD, HAB, 0/5 ADF, tr/5 APF--stable motor exam Sensation remains diminished on left  Assessment/Plan: 1. Functional deficits which require 3+ hours per day of interdisciplinary therapy in a comprehensive inpatient rehab setting.  Physiatrist is  providing close team supervision and 24 hour management of active medical problems listed below.  Physiatrist and rehab team continue to assess barriers to discharge/monitor patient progress toward functional and medical goals  Care Tool:  Bathing    Body parts bathed by patient: Left arm,Chest,Abdomen,Front perineal area,Face   Body parts bathed by helper: Right arm,Buttocks,Right upper leg,Left upper leg,Right lower leg,Left lower leg     Bathing assist Assist Level: Dependent - Patient 0%     Upper Body Dressing/Undressing Upper body dressing   What is the patient wearing?: Pull over shirt    Upper body assist Assist Level: Minimal Assistance - Patient > 75%    Lower Body Dressing/Undressing Lower body dressing      What is the patient wearing?: Pants     Lower body assist Assist for lower body dressing: 2 Helpers (dep in stedy for STS with + 2 to power up)     Toileting Toileting    Toileting assist Assist for toileting: Total Assistance - Patient < 25%     Transfers Chair/bed transfer  Transfers assist  Chair/bed transfer activity did not occur: Safety/medical concerns  Chair/bed transfer assist level: Total Assistance - Patient < 25% Chair/bed transfer assistive device:  Charlaine Dalton)   Locomotion Ambulation  Ambulation assist   Ambulation activity did not occur: Safety/medical concerns          Walk 10 feet activity   Assist  Walk 10 feet activity did not occur: Safety/medical concerns        Walk 50 feet activity   Assist Walk 50 feet with 2 turns activity did not occur: Safety/medical concerns         Walk 150 feet activity   Assist Walk 150 feet activity did not occur: Safety/medical concerns         Walk 10 feet on uneven surface  activity   Assist Walk 10 feet on uneven surfaces activity did not occur: Safety/medical concerns         Wheelchair     Assist Will patient use wheelchair at discharge?: Yes Type of  Wheelchair: Manual Wheelchair activity did not occur: Safety/medical concerns         Wheelchair 50 feet with 2 turns activity    Assist    Wheelchair 50 feet with 2 turns activity did not occur: Safety/medical concerns       Wheelchair 150 feet activity     Assist  Wheelchair 150 feet activity did not occur: Safety/medical concerns       Blood pressure 122/71, pulse (!) 58, temperature 97.8 F (36.6 C), temperature source Oral, resp. rate 16, height 6\' 3"  (1.905 m), weight (!) 147.5 kg, SpO2 96 %. Medical Problem List and Plan: 1.  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   -spoke with NS re: drainage from [place]. Dr. Lacinda Axon recommends surgical intervention/washout. Will need to go back to [facility] for this. I notified Dr. Mickeal Skinner of the developments. Further onc/rad-onc mgt after resolution of his surgical issues. Discussed with Mrs. Donley as well.   -arrange for transportation as possible 2.  Antithrombotics: -DVT/anticoagulation:  Pharmaceutical: Lovenox  -Dopplers 3/19 revealed thrombus in the left popliteal and peroneal veins. -IR has placed IVC filter   -antiplatelet therapy: N/A 3. Pain Management: Oxycodone prn. Will continue tylenol qid.  -continue HS topamax for headaches and to assist with sleep    - topamax to 50mg .  4/2-took something for his headache this morning-continue regimen 4. Mood/sleep:    -sleep chart  continue low dose seroquel 50 mg bid  Continue trazodone 100mg  qhs, continue melatonin  -continue xanax for anxiety 0.5mg  bid prn  -continue paxil 20mg  daily  Neuropsych evaluation today appreciated  4/5 behavior stable, continue current meds.   -occasionally is emotionally labile but episodes are limited 5. Neuropsych: This patient is partially capable of making decisions on her own  behalf.  Telesitter for safety 6. Skin/Wound Care: scalp wound healing.  7. Fluids/Electrolytes/Nutrition:  encourage PO  -protein supp for low albumin 8. Hypotension: Monitor BP tid--off Cozaar at this time.   4/5 soft/ normal 9. ABLA due to UGIB s/p hemo clip:   Hemoglobin 9.8 on 3/24--->10.7 3/28 and again 4/4  10 Leukocytosis:   -likely related to steroids and DVT. No fever, s/s infection currently  WBCs 15.8 on 3/24-->13.1 3/28--> 8.5 4/4  Decadron 2mg  daily--stopped d/t wound drainage 11. Cerebral edema:   -CT findings appear to be what would be expected post-op  -steroids as above 12. 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   4/2-no seizures in the last 24 hours-continue  Keppra 13. Constipation:   Appears to be improving per documentation  4/2-last bowel movement yesterday; continue regimen 14. Hyponatremia:   Sodium 135 on 3/24-->136 on 3/28-->135 4/4   15. Drainage from Crani site-  4/5. See above, continue doxycycline   -only one low grade temp elevation yesterday    -wbc 8.5 4/4    -keep area dressed, dry   LOS: 18 days A FACE TO [place] 09/14/2020, 11:14 AM

## 2020-09-14 NOTE — Progress Notes (Signed)
Speech Language Pathology Daily Session Note  Patient Details  Name: KOLE HILYARD MRN: [id number] Date of Birth: 1970-07-17  Today's Date: 09/14/2020 SLP Individual Time: 5916-3846 SLP Individual Time Calculation (min): 55 min  Short Term Goals: Week 3: SLP Short Term Goal 1 (Week 3): Patient will identify 2 physical and 2 cognitive changes with Min verbal cues. SLP Short Term Goal 2 (Week 3): Patient will orient to place, time and situation with supervision verbal and visual cues. SLP Short Term Goal 3 (Week 3): Patient will demonstrate selective attention to functional tasks in a mildly disctrating enviornment for ~30 minutes with Min verbal cues for redirection. SLP Short Term Goal 4 (Week 3): Patient will demonstrate functional problem solving for basic and familiar tasks with Min A verbal and visual cues. SLP Short Term Goal 5 (Week 3): Pt will increase left field scanning to increase safety during functional activities (mobility, reading, writing, etc) provided supervision A verbal and visual cues  Skilled Therapeutic Interventions: Skilled treatment session focused on cognitive goals. SLP facilitated session by providing extra time and overall Mod A verbal cues for recall of his current medications and their functions. Min verbal cues were needed to self-monitor and correct errors during a complex medication management task of organizing a QD pill box. Patient demonstrated selective attention to task for ~40 minutes with supervision verbal cues for redirection. Overall, patient continues to demonstrate appropriate social interaction without confusion or lability noted. Patient left upright in tilt-n-space wheelchair with alarm on and all needs within reach. Continue with current plan of care.       Pain Pain Assessment Pain Scale: 0-10 Pain Score: 5  Pain Type: Acute pain Pain Location: Head Pain Descriptors / Indicators: Aching;Discomfort Pain Frequency: Constant Pain Onset:  On-going Patients Stated Pain Goal: 3 Pain Intervention(s): Medication (See eMAR)  Therapy/Group: Individual Therapy  Arleene Settle 09/14/2020, 10:02 AM

## 2020-09-14 NOTE — Plan of Care (Signed)
  Problem: Consults Goal: RH BRAIN INJURY PATIENT EDUCATION Description: Description: See Patient Education module for eduction specifics Outcome: Progressing   Problem: RH BOWEL ELIMINATION Goal: RH STG MANAGE BOWEL WITH ASSISTANCE Description: STG Manage Bowel with Assistance. Mod I Outcome: Progressing   Problem: RH KNOWLEDGE DEFICIT BRAIN INJURY Goal: RH STG INCREASE KNOWLEDGE OF SELF CARE AFTER BRAIN INJURY Description: Patient will be able to demonstrate knowledge of medication management, pain management, skin/wound care management with educational materials and handouts provided by staff. Outcome: Progressing   Problem: Safety: Goal: Non-violent Restraint(s) Outcome: Progressing   Problem: RH BLADDER ELIMINATION Goal: RH STG MANAGE BLADDER WITH ASSISTANCE Description: STG Manage Bladder With Assistance. Mod I Outcome: Not Progressing

## 2020-09-14 NOTE — Progress Notes (Signed)
Physical Therapy Session Note  Patient Details  Name: Todd Villa MRN: [id number] Date of Birth: 10-Mar-1971  Today's Date: 09/14/2020 PT Individual Time: 1400-1450 PT Individual Time Calculation (min): 50 min   Short Term Goals: Week 3:  PT Short Term Goal 1 (Week 3): STGs = LTGs  Skilled Therapeutic Interventions/Progress Updates:     Patient in bed upon PT arrival. Patient alert and agreeable to PT session. Patient denied pain during session. MD arrived at beginning of session to inform the patient that he had spoken with his wife and that she would like to transport the patient to [place] today due to pending surgery.   Focused session on car transfer training to assess safe transport.  Patient required max A +2 assist to perform a simulated sedan height car transfer. Required total assist to prevent fall into the car due to poor motor planning and body awareness during transfer. Not recommending car transfers with patient at this time due to safety concerns. MD and CSW made aware.  Therapeutic Activity: Bed Mobility: Patient performed supine to/from sit with min A in a flat bed with use of bed rail. Provided verbal cues for attention to L arm and use of R elbow to prop up then push up to sitting. Transfers: Patient performed squat pivot with mod-max A bed<>w/c with PT blocking L knee throughout. Provided verbal cues for head-hip relationship , R hand placement, and sequencing.  Patient performed a simulated sedan height car transfer using a slide board with max A of 1 initially and progressing to max A +2 due to L lateral LOB into the car requiring total A for trunk control to prevent patient from falling to his side then requiring max A +2 to return to the w/c. Provided cues for safe technique, positioning, safety awareness, attention to L side, head-hips relationship, and R hand placement.  Wheelchair Mobility:  Patient was transported in the [place] w/c with total A throughout session  for energy conservation and time management.  Patient in bed at end of session with breaks locked, bed alarm set, and all needs within reach. Patient reported urin incontinence, handed off to LPN at end of session.    Therapy Documentation Precautions:  Precautions Precautions: None Restrictions Weight Bearing Restrictions: No LUE Weight Bearing: Non weight bearing RLE Weight Bearing: Weight bearing as tolerated LLE Weight Bearing: Non weight bearing   Therapy/Group: Individual Therapy  Joanna Hall L Velena Keegan PT, DPT  09/14/2020, 4:08 PM

## 2020-09-14 NOTE — Discharge Summary (Addendum)
Physician Discharge Summary  Patient ID: Todd Villa MRN: [id number] DOB/AGE: 1971-01-13 50 y.o.  Admit date: 08/27/2020 Discharge date: 09/15/2020  Discharge Diagnoses:  Principal Problem:   Wound drainage Active Problems:   Glioblastoma multiforme of brain (HCC)   Acute deep vein thrombosis (DVT) of popliteal vein of left lower extremity (HCC)   Vascular headache   Sleep disorder   Left hemiparesis (HCC)   Sleep disturbance   Hyponatremia   New onset seizure ([place])   Slow transit constipation   Discharged Condition:  Stable   Significant Diagnostic Studies: CT HEAD WO CONTRAST  Addendum Date: 08/30/2020   ADDENDUM REPORT: 08/30/2020 21:46 ADDENDUM: These results were called by telephone at the time of interpretation on 08/30/2020 at 9:46 pm to provider PA [place], who verbally acknowledged these results. Electronically Signed   By: Kellie Simmering DO   On: 08/30/2020 21:46   Result Date: 08/30/2020 CLINICAL DATA:  Mental status change, unknown cause; headaches, visual changes-new, GBM with surgery at [facility]. EXAM: CT HEAD WITHOUT CONTRAST TECHNIQUE: Contiguous axial images were obtained from the base of the skull through the vertex without intravenous contrast. COMPARISON:  Prior brain MRI 08/17/2020. FINDINGS: Brain: Cerebral volume is normal for age. Since the prior MRI of 08/17/2020, there has been interval right-sided craniotomy/cranioplasty for resection of a right cerebral mass. Also new from this prior exam, there is a cystic focus within the right frontoparietal lobes extending inferiorly toward the right basal ganglia and thalamus measuring 4.2 x 4.8 x 4.6 cm (AP x TV x CC) compatible with a resection cavity. Persistent fairly extensive predominantly white matter hypoattenuation within the surrounding right frontoparietal lobes extending into the white matter tracks inferiorly and also into the right temporal lobe. Findings may reflect edema and/or infiltrative tumor. Also new  from the prior exam, there is a mixed attenuation extra-axial fluid collection overlying the right frontoparietal lobes measuring up to 8 mm in greatest thickness. There are hyperdense components within the collection posteriorly, compatible with a small amount of acute/subacute hemorrhage (for instance as seen on series 5, image 53). Persistent mass effect with partial effacement of the right lateral ventricle. 6 mm leftward midline shift measured at the level of the septum pellucidum (previously 7 mm). No definite demarcated cortical infarct. Vascular: No hyperdense vessel.  Atherosclerotic calcifications. Skull: Interval right craniotomy/cranioplasty. No calvarial fracture. Sinuses/Orbits: Visualized orbits show no acute finding. Mild mucosal thickening within the inferior left maxillary sinus. Trace bilateral ethmoid sinus mucosal thickening. Other: Scalp fluid collection overlying the cranioplasty measuring up to 15 mm in thickness (for instance as seen on series 3, image 20). IMPRESSION: There has been interval, presumed partial, resection of a tumor centered within the deep right frontoparietal region. 4.2 x 4.8 x 4.6 cm resection cavity at this site. Persistent fairly extensive predominantly white matter hypoattenuation within the surrounding frontoparietal lobes extending inferiorly and also into the right temporal lobe. This likely reflects a combination of edema and residual infiltrative tumor. Persistent mass effect with partial effacement of the right lateral ventricle. 6 mm leftward midline shift (previously 7 mm). New mixed attenuation extra-axial collection overlying the right frontoparietal lobes measuring up to 8 mm in greatest thickness. Small amount of acute/early subacute hemorrhage within the collection posteriorly. Scalp fluid collection overlying the cranioplasty measuring up to 15 mm in thickness. Mild paranasal sinus disease as described. Electronically Signed: By: Kellie Simmering DO On:  08/30/2020 20:07    IR IVC FILTER PLMT / S&I Burke Keels GUID/MOD SED  Result Date: 08/29/2020 CLINICAL DATA:  DVT. Recent intracranial surgery, a relative contraindication to anticoagulation. Caval filtration requested. EXAM: INFERIOR VENACAVOGRAM IVC FILTER PLACEMENT UNDER FLUOROSCOPY FLUOROSCOPY TIME:  1 minutes 24 seconds; 121 mGy TECHNIQUE: Patency of the right IJ vein was confirmed with ultrasound with image documentation. An appropriate skin site was determined. Skin site was marked, prepped with chlorhexidine, and draped using maximum barrier technique. The region was infiltrated locally with 1% lidocaine. Intravenous Fentanyl 84mcg and Versed 1mg  were administered as conscious sedation during continuous monitoring of the patient's level of consciousness and physiological / cardiorespiratory status by the radiology RN, with a total moderate sedation time of 42 minutes. Under real-time ultrasound guidance, the right IJ vein was accessed with a 21 gauge micropuncture needle; the needle tip within the vein was confirmed with ultrasound image documentation. The needle was exchanged over a 018 guidewire for a transitional dilator, which allow advancement of the [facility] wire into the IVC. A long 6 French vascular sheath was placed for inferior venacavography. This demonstrated no caval thrombus. Renal vein inflows were evident. [facility] IVC filter was advanced through the sheath and successfully deployed under fluoroscopy at the L2 level. Followup cavagram demonstrates stable filter position and no evident complication. The sheath was removed and hemostasis achieved at the site. No immediate complication. IMPRESSION: 1. Normal IVC. No thrombus or significant anatomic variation. 2. Technically successful infrarenal IVC filter placement. This is a retrievable model. PLAN: This IVC filter is potentially retrievable. The patient will be assessed for filter retrieval by Interventional Radiology in approximately 8-12  weeks. Further recommendations regarding filter retrieval, continued surveillance or declaration of device permanence, will be made at that time. Electronically Signed   By: Lucrezia Europe M.D.   On: 08/29/2020 13:59   VAS [place] LOWER EXTREMITY VENOUS (DVT)  Result Date: 08/29/2020  Lower Venous DVT Study Indications: Edema.  Risk Factors: None identified. Limitations: Body habitus and poor ultrasound/tissue interface. Comparison Study: No prior studies. Performing Technologist: Oliver Hum RVT  Examination Guidelines: A complete evaluation includes B-mode imaging, spectral Doppler, color Doppler, and power Doppler as needed of all accessible portions of each vessel. Bilateral testing is considered an integral part of a complete examination. Limited examinations for reoccurring indications may be performed as noted. The reflux portion of the exam is performed with the patient in reverse Trendelenburg.  +---------+---------------+---------+-----------+----------+--------------+ RIGHT    CompressibilityPhasicitySpontaneityPropertiesThrombus Aging +---------+---------------+---------+-----------+----------+--------------+ CFV      Full           Yes      Yes                                 +---------+---------------+---------+-----------+----------+--------------+ SFJ      Full                                                        +---------+---------------+---------+-----------+----------+--------------+ FV Prox  Full                                                        +---------+---------------+---------+-----------+----------+--------------+ FV Mid   Full                                                        +---------+---------------+---------+-----------+----------+--------------+  FV DistalFull                                                        +---------+---------------+---------+-----------+----------+--------------+ PFV      Full                                                         +---------+---------------+---------+-----------+----------+--------------+ POP      Full           Yes      Yes                                 +---------+---------------+---------+-----------+----------+--------------+ PTV      Full                                                        +---------+---------------+---------+-----------+----------+--------------+ PERO     Full                                                        +---------+---------------+---------+-----------+----------+--------------+   +---------+---------------+---------+-----------+----------+--------------+ LEFT     CompressibilityPhasicitySpontaneityPropertiesThrombus Aging +---------+---------------+---------+-----------+----------+--------------+ CFV      Full           Yes      Yes                                 +---------+---------------+---------+-----------+----------+--------------+ SFJ      Full                                                        +---------+---------------+---------+-----------+----------+--------------+ FV Prox  Full                                                        +---------+---------------+---------+-----------+----------+--------------+ FV Mid   Full                                                        +---------+---------------+---------+-----------+----------+--------------+ FV DistalFull                                                        +---------+---------------+---------+-----------+----------+--------------+  PFV      Full                                                        +---------+---------------+---------+-----------+----------+--------------+ POP      Partial        No       No                   Acute          +---------+---------------+---------+-----------+----------+--------------+ PTV      Full                                                         +---------+---------------+---------+-----------+----------+--------------+ PERO     Partial                                      Acute          +---------+---------------+---------+-----------+----------+--------------+ Gastroc  Full                                                        +---------+---------------+---------+-----------+----------+--------------+     Summary: RIGHT: - There is no evidence of deep vein thrombosis in the lower extremity.  - No cystic structure found in the popliteal fossa.  LEFT: - Findings consistent with acute deep vein thrombosis involving the left popliteal vein, and left peroneal veins. - No cystic structure found in the popliteal fossa.  *See table(s) above for measurements and observations. Electronically signed by Ruta Hinds MD on 08/29/2020 at 11:13:43 AM.    Final     Labs:  Basic Metabolic Panel: BMP Latest Ref Rng & Units 09/13/2020 09/06/2020 09/02/2020  Glucose 70 - 99 mg/dL 87 135(H) 129(H)  BUN 6 - 20 mg/dL 14 17 18   Creatinine 0.61 - 1.24 mg/dL 0.69 0.90 0.83  Sodium 135 - 145 mmol/L 135 136 135  Potassium 3.5 - 5.1 mmol/L 3.6 3.6 3.6  Chloride 98 - 111 mmol/L 102 106 103  CO2 22 - 32 mmol/L 25 22 25   Calcium 8.9 - 10.3 mg/dL 8.6(L) 8.6(L) 8.6(L)    CBC: CBC Latest Ref Rng & Units 09/13/2020 09/06/2020 09/02/2020  WBC 4.0 - 10.5 K/uL 8.5 13.1(H) 15.8(H)  Hemoglobin 13.0 - 17.0 g/dL 10.7(L) 10.7(L) 9.8(L)  Hematocrit 39.0 - 52.0 % 33.0(L) 32.4(L) 30.0(L)  Platelets 150 - 400 K/uL 253 259 291    CBG: Recent Labs  Lab 09/13/20 2014 09/14/20 0010 09/14/20 0420 09/14/20 0751 09/14/20 1132  GLUCAP 93 84 90 82 119*    Brief HPI:   Todd Villa is a 50 y.o. male with history of HTN, OSA, recent CVA with mild left-sided weakness who was evaluated at [facility] on 08/17/2020 after a fall and reports of 2 weeks of progressive left-sided weakness with lethargy.  He was found to have large heterogenous mass concerning for GBM in the  right frontoparietal region  with prominent edema and effacement of left lateral ventricle.  He did have seizure in ED and was started on Keppra and Decadron and transferred to [facility] for management.  On 03/12 he underwent right [place] for tumor resection by Dr. Lacinda Axon with pathology revealing high-grade glioma.  Hospital course significant for issues with progressive cerebral edema treated with mannitol and titration of steroids.  He did have tachycardia with acute blood loss anemia due to gastric blood vessel bleeding which was treated with clipping by GI and a change in H was improving posttransfusion.  Pain was being managed with use of Tylenol and oral oxycodone and steroids were being weaned off.  Patient with resultant left hemiplegia as well as cognitive deficits affecting ADLs and mobility.  CIR was recommended to functional decline.   Hospital Course: Todd Villa was admitted to rehab 08/27/2020 for inpatient therapies to consist of PT, ST and OT at least three hours five days a week. Past admission physiatrist, therapy team and rehab RN have worked together to provide customized collaborative inpatient rehab. Surveillance dopplers ordered due to dense right hemiplegia showed acute DVT in Left peroneal and posterior tibial veins. IVC filter as placed by Dr. Vernard Gambles on 03/20.  He was started on steroid taper per protocol but developed visual changes with floaters as well as issues with anxiety. Follow up CT head showed persistent mass effect with 6 mm leftward midline shift as well as scalp fluid collection over cranioplasty up to 15 mm thickness. Steroids were briefly increased without improvement but with  increase in irritability/aggressive behaviors therefore were weaned off. Serial CBC showed that leucocytosis were improving.  UA/UCS ordered to rule out infectious source and was negative. Seroquel was scheduled to help manage anxiety as well as Paxil for mood stabilization.    Topamax was added  and titrated to help manage headaches. Constipation has resolved with adjustment of laxatives.  His blood pressures were monitored on TID basis and have been stable off medications. . Hyperglycemia has improved off steroids. He has been seizure free during his stay and hyponatremia has resolved. Dr. Mickeal Skinner hem/onc was consulted for input and planned to follow up with patient at [facility] for treatment after discharge. 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 Nudexta was added on 03/29 but after a couple of dose,  patient developed extreme agitation with aggressive behaviors requiring haldol as well as enclosure bed for safety. Medication was d/c with return to baseline the next day.    Sleep wake disruption has resolved with some improvement in left hemiplegia. He was developed serosanguinous drainage from scalp incision on 04/03 and was started on doxycycline bid as well as dressing changes per discussion with local NSG. Follow up CBC showed that leucocytosis has resolved and he has been afebrile. As he continued to have moderated amount of serosanguinous drainage from wound therefore Dr. Lacinda Axon was consulted for input and recommended surgical washout of wound. [facility] contacted for transfer, he was made NPO after midnight on 04/06 pending bed availability. Dr. Venetia Constable was consulted for input per recommendations from Dr. Lacinda Axon due to concerns of bed availability. However, bed was available later in day on 04/06 and patient was discharged to [facility] for management.    Rehab course: During patient's stay in rehab weekly team conferences were held to monitor patient's progress, set goals and discuss barriers to discharge. At admission, patient required max assist with basic ADLs and total assist  with mobility. He exhibited minimal dysarthria with fatigue as well as mild to moderate cognitive deficits. He has had improvement in activity  tolerance, balance, postural control as well as ability to compensate for deficits.  He requires max assist +2 for transfers.He requires mod verbal cues for recall and complex tasks and min cues to self monitor for errors. He is able to maintain attention to tasks for 40 minutes. SLUMS 18/20 which has remained same but patient has made functional gains.   Diet: Regular  Disposition:  [facility]   Allergies as of 09/15/2020      Reactions   Nuedexta [dextromethorphan-quinidine]    Psychosis   Penicillins Other (See Comments)   Childhood reaction - patient is unsure of what happened.      Medication List    STOP taking these medications   bisacodyl 10 MG suppository Commonly known as: DULCOLAX   dexamethasone 4 MG tablet Commonly known as: DECADRON   enoxaparin 40 MG/0.4ML injection Commonly known as: LOVENOX   HYDRALAZINE HCL IJ   hydrochlorothiazide 12.5 MG capsule Commonly known as: MICROZIDE   LABETALOL HCL IV   losartan 100 MG tablet Commonly known as: COZAAR   ondansetron 4 MG/2ML Soln injection Commonly known as: ZOFRAN     TAKE these medications   acetaminophen 325 MG tablet Commonly known as: TYLENOL Take 2 tablets (650 mg total) by mouth 4 (four) times daily -  with meals and at bedtime. What changed:   how much to take  when to take this   ALPRAZolam 0.5 MG tablet Commonly known as: XANAX Take 1 tablet (0.5 mg total) by mouth 2 (two) times daily as needed for anxiety.   atorvastatin 10 MG tablet Commonly known as: LIPITOR Take 10 mg by mouth daily.   bacitracin 500 UNIT/GM ointment Apply 1 application topically daily. Apply to incision site for 14 days.   bacitracin ointment Apply topically 2 (two) times daily.   doxycycline 100 MG tablet Commonly known as: VIBRA-TABS Take 1 tablet (100 mg total) by mouth every 12 (twelve) hours.   FLUoxetine 20 MG capsule Commonly known as: PROZAC Take 20 mg by mouth daily.   levETIRAcetam 1000 MG  tablet Commonly known as: KEPPRA Take 1,000 mg by mouth every 12 (twelve) hours.   melatonin 3 MG Tabs tablet Take 1 tablet (3 mg total) by mouth at bedtime.   oxyCODONE 5 MG immediate release tablet Commonly known as: Oxy IR/ROXICODONE Take 5 mg by mouth every 4 (four) hours as needed (for pain not relieved by tylenol).   pantoprazole 40 MG tablet Commonly known as: PROTONIX Take 40 mg by mouth daily.   polyethylene glycol 17 g packet Commonly known as: MIRALAX / GLYCOLAX Take 17 g by mouth daily.   QUEtiapine 50 MG tablet Commonly known as: SEROQUEL Take 1 tablet (50 mg total) by mouth 2 (two) times daily.   senna-docusate 8.6-50 MG tablet Commonly known as: Senokot-S Take 2 tablets by mouth 2 (two) times daily.   topiramate 50 MG tablet Commonly known as: TOPAMAX Take 1 tablet (50 mg total) by mouth at bedtime.   traZODone 100 MG tablet Commonly known as: DESYREL Take 1 tablet (100 mg total) by mouth at bedtime.       Follow-up Information    Meredith Staggers, MD Follow up.   Specialty: Physical Medicine and Rehabilitation Contact information: [address] [place] [address] [phone]        Ventura Sellers, MD  Follow up.   Specialties: Psychiatry, Neurology, Oncology Contact information: [place] [address] [phone]        [place] Follow up.   Contact information: [place] [address] [phone]               Signed: Bary Leriche 09/15/2020, 4:25 PM

## 2020-09-15 ENCOUNTER — Inpatient Hospital Stay (HOSPITAL_COMMUNITY): Payer: Commercial Managed Care - PPO

## 2020-09-15 DIAGNOSIS — C719 Malignant neoplasm of brain, unspecified: Secondary | ICD-10-CM | POA: Diagnosis not present

## 2020-09-15 DIAGNOSIS — L24A9 Irritant contact dermatitis due friction or contact with other specified body fluids: Secondary | ICD-10-CM

## 2020-09-15 DIAGNOSIS — I82432 Acute embolism and thrombosis of left popliteal vein: Secondary | ICD-10-CM | POA: Diagnosis not present

## 2020-09-15 DIAGNOSIS — G8194 Hemiplegia, unspecified affecting left nondominant side: Secondary | ICD-10-CM | POA: Diagnosis not present

## 2020-09-15 DIAGNOSIS — G441 Vascular headache, not elsewhere classified: Secondary | ICD-10-CM | POA: Diagnosis not present

## 2020-09-15 LAB — BASIC METABOLIC PANEL
Anion gap: 8 (ref 5–15)
BUN: 18 mg/dL (ref 6–20)
CO2: 25 mmol/L (ref 22–32)
Calcium: 8.8 mg/dL — ABNORMAL LOW (ref 8.9–10.3)
Chloride: 106 mmol/L (ref 98–111)
Creatinine, Ser: 0.72 mg/dL (ref 0.61–1.24)
GFR, Estimated: >60 mL/min (ref >60)
Glucose, Bld: 94 mg/dL (ref 70–99)
Potassium: 3.5 mmol/L (ref 3.5–5.1)
Sodium: 139 mmol/L (ref 135–145)

## 2020-09-15 LAB — CBC WITH DIFFERENTIAL/PLATELET
Abs Immature Granulocytes: 0.3 10*3/uL — ABNORMAL HIGH (ref 0.00–0.07)
Basophils Absolute: 0 10*3/uL (ref 0.0–0.1)
Basophils Relative: 0 %
Eosinophils Absolute: 0.2 10*3/uL (ref 0.0–0.5)
Eosinophils Relative: 2 %
HCT: 35.2 % — ABNORMAL LOW (ref 39.0–52.0)
Hemoglobin: 11.4 g/dL — ABNORMAL LOW (ref 13.0–17.0)
Lymphocytes Relative: 33 %
Lymphs Abs: 2.9 10*3/uL (ref 0.7–4.0)
MCH: 31.1 pg (ref 26.0–34.0)
MCHC: 32.4 g/dL (ref 30.0–36.0)
MCV: 95.9 fL (ref 80.0–100.0)
Metamyelocytes Relative: 1 %
Monocytes Absolute: 0.6 10*3/uL (ref 0.1–1.0)
Monocytes Relative: 7 %
Myelocytes: 3 %
Neutro Abs: 4.7 10*3/uL (ref 1.7–7.7)
Neutrophils Relative %: 54 %
Platelets: 375 10*3/uL (ref 150–400)
RBC: 3.67 MIL/uL — ABNORMAL LOW (ref 4.22–5.81)
RDW: 16.8 % — ABNORMAL HIGH (ref 11.5–15.5)
WBC: 8.7 10*3/uL (ref 4.0–10.5)
nRBC: 0 % (ref 0.0–0.2)

## 2020-09-15 LAB — RESP PANEL BY RT-PCR (FLU A&B, COVID) ARPGX2
Influenza A by PCR: NEGATIVE
Influenza B by PCR: NEGATIVE
SARS Coronavirus 2 by RT PCR: NEGATIVE

## 2020-09-15 LAB — GLUCOSE, CAPILLARY
Glucose-Capillary: 121 mg/dL — ABNORMAL HIGH (ref 70–99)
Glucose-Capillary: 87 mg/dL (ref 70–99)
Glucose-Capillary: 91 mg/dL (ref 70–99)
Glucose-Capillary: 92 mg/dL (ref 70–99)

## 2020-09-15 MED ORDER — DEXTROSE-NACL 5-0.45 % IV SOLN
INTRAVENOUS | Status: DC
  Filled 2020-09-15 (×2): qty 1000

## 2020-09-15 NOTE — Consult Note (Signed)
Neurosurgery Consultation  Reason for Consult: Wound drainage Referring Physician: Naaman Plummer  CC: Wound drainage  HPI: This is a 50 y.o. man that presents while here in rehab. He had a GBM resected on 08/21/20. I was consulted for new wound drainage. The patient denies any new pain / tenderness in the region. No new headaches, he has been NPO but prior to that he reports a normal appetite w/o N/V. No systemic / toxic symptoms, no new headaches, pt has no other complaints at this time.   ROS: A 14 point ROS was performed and is negative except as noted in the HPI.   PMHx:  Past Medical History:  Diagnosis Date  . High blood pressure   . Obesity   . 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 Hyperlipidemia Mother   . Heart disease Mother   . Hypertension Mother   . Diabetes Mother   . Alcohol abuse Father   . Heart disease Father   . Hypertension Father   . Diabetes Sister   . Cancer Neg Hx   . Stroke Neg Hx    SocHx:  reports that he has been smoking cigarettes. He has a 19.80 pack-year smoking history. He has never used smokeless tobacco. He reports that he does not drink alcohol and does not use drugs.  Exam: Vital signs in last 24 hours: Temp:  [97.7 F (36.5 C)-98.1 F (36.7 C)] 97.7 F (36.5 C) (04/06 1420) Pulse Rate:  [64-88] 64 (04/06 1420) Resp:  [17-18] 17 (04/06 1420) BP: (98-119)/(68-79) 99/68 (04/06 1420) SpO2:  [93 %-100 %] 100 % (04/06 1420) General: Awake, alert, cooperative, lying in bed in NAD Head: Normocephalic, R curvilinear incision centered over the EAC A-P up to the convexity, intact incision except for roughly 2cm area of dehiscence posteriorly, some serous drainage and fluid collection underneath the flap, +drainage on pillow HEENT: Neck supple Pulmonary: breathing room air comfortably, no evidence of increased work of breathing Cardiac: RRR Abdomen: S NT ND Extremities: Warm and well perfused  x4 Neuro: AOx3, PERRL, EOMI, +L [facility] - mild, speech fluent w/ normal content Strength 5/5 on R, LUE 3/5, LLE 4-/5  Assessment and Plan: 50 y.o. man s/p crani for R GBM resection, now with drainage from his wound.   -discussed w/ the pt and Dr. Lacinda Axon, the patient's surgeon, agree with him being transferred to [place] where index surgery was performed for his wound care. If this doesn't happen and the patient develops any new symptoms like systemic toxicity, frankly purulent drainage, etc then let me know so I can re-evaluate him.   Todd Part, MD 09/15/20 3:05 PM [facility]

## 2020-09-15 NOTE — Progress Notes (Signed)
PROGRESS NOTE   Subjective/Complaints:  Pt reports no problems overnight. Sleep was a little sporadic. Pain controlled  ROS: Patient denies fever, rash, sore throat, blurred vision, nausea, vomiting, diarrhea, cough, shortness of breath or chest pain, joint or back pain,   or mood change.    Objective:   No results found. Recent Labs    09/13/20 0801  WBC 8.5  HGB 10.7*  HCT 33.0*  PLT 253   Recent Labs    09/13/20 0801  NA 135  K 3.6  CL 102  CO2 25  GLUCOSE 87  BUN 14  CREATININE 0.69  CALCIUM 8.6*    Intake/Output Summary (Last 24 hours) at 09/15/2020 0827 Last data filed at 09/15/2020 0434 Gross per 24 hour  Intake --  Output 1000 ml  Net -1000 ml        Physical Exam: Vital Signs Blood pressure 98/76, pulse 70, temperature 97.8 F (36.6 C), resp. rate 17, height 6\' 3"  (1.905 m), weight (!) 147.5 kg, SpO2 97 %.     Constitutional: No distress . Vital signs reviewed. HEENT: EOMI, oral membranes moist Neck: supple Cardiovascular: RRR without murmur. No JVD    Respiratory/Chest: CTA Bilaterally without wheezes or rales. Normal effort    GI/Abdomen: BS +, non-tender, non-distended Ext: no clubbing, cyanosis  Psych: pleasant and cooperative today Skin: right crani incision with decreased drainage this morning, still from posterior aspect. Area remains clean, no odor, non-tender.  Musc: Mild left upper extremity edema stil lpresent.  . Neuro: alert, oriented to person, place, reason, name, month, year. Motor: LUE 1+ chest, biceps, tr-1 triceps,  tr/5 distally. 1-2/5 HF/HE,KE, HAD, HAB, 1/5 ADF, tr/5 APF--  Sensation remains diminished on left  Assessment/Plan: 1. Functional deficits which require 3+ hours per day of interdisciplinary therapy in a comprehensive inpatient rehab setting.  Physiatrist is providing close team supervision and 24 hour management of active medical problems listed  below.  Physiatrist and rehab team continue to assess barriers to discharge/monitor patient progress toward functional and medical goals  Care Tool:  Bathing    Body parts bathed by patient: Left arm,Chest,Abdomen,Front perineal area,Face,Right upper leg,Left upper leg,Right lower leg,Left lower leg,Buttocks   Body parts bathed by helper: Right arm,Buttocks     Bathing assist Assist Level: Minimal Assistance - Patient > 75%     Upper Body Dressing/Undressing Upper body dressing   What is the patient wearing?: Pull over shirt    Upper body assist Assist Level: Supervision/Verbal cueing    Lower Body Dressing/Undressing Lower body dressing      What is the patient wearing?: Pants     Lower body assist Assist for lower body dressing: Minimal Assistance - Patient > 75%     Toileting Toileting    Toileting assist Assist for toileting: Minimal Assistance - Patient > 75%     Transfers Chair/bed transfer  Transfers assist  Chair/bed transfer activity did not occur: Safety/medical concerns  Chair/bed transfer assist level: Moderate Assistance - Patient 50 - 74% Chair/bed transfer assistive device: Orthosis   Locomotion Ambulation   Ambulation assist   Ambulation activity did not occur: Safety/medical concerns  Walk 10 feet activity   Assist  Walk 10 feet activity did not occur: Safety/medical concerns        Walk 50 feet activity   Assist Walk 50 feet with 2 turns activity did not occur: Safety/medical concerns         Walk 150 feet activity   Assist Walk 150 feet activity did not occur: Safety/medical concerns         Walk 10 feet on uneven surface  activity   Assist Walk 10 feet on uneven surfaces activity did not occur: Safety/medical concerns         Wheelchair     Assist Will patient use wheelchair at discharge?: Yes Type of Wheelchair: Manual Wheelchair activity did not occur: Safety/medical concerns          Wheelchair 50 feet with 2 turns activity    Assist    Wheelchair 50 feet with 2 turns activity did not occur: Safety/medical concerns       Wheelchair 150 feet activity     Assist  Wheelchair 150 feet activity did not occur: Safety/medical concerns       Blood pressure 98/76, pulse 70, temperature 97.8 F (36.6 C), resp. rate 17, height 6\' 3"  (1.905 m), weight (!) 147.5 kg, SpO2 97 %. Medical Problem List and Plan: 1.  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   -awaiting available bed at [facility] since mid day yesterday. Pt to return for washout of surgical site, reclosure   -keep NPO this morning   -will run D5 1/2NS for now 2.  Antithrombotics: -DVT/anticoagulation:  Pharmaceutical: Lovenox  -Dopplers 3/19 revealed thrombus in the left popliteal and peroneal veins. -IR has placed IVC filter   -antiplatelet therapy: N/A 3. Pain Management: Oxycodone prn. Will continue tylenol qid.  -continue HS topamax for headaches and to assist with sleep    - topamax to 50mg .   4. Mood/sleep:    -sleep chart  continue low dose seroquel 50 mg bid  Continue trazodone 100mg  qhs, continue melatonin  -continue xanax for anxiety 0.5mg  bid prn  -continue paxil 20mg  daily  Neuropsych evaluation today appreciated  4/6 behavior stable with above meds 5. Neuropsych: This patient is partially capable of making decisions on her own behalf.  Telesitter for safety 6. Skin/Wound Care: scalp wound healing.  7. Fluids/Electrolytes/Nutrition:  encourage PO  -protein supp for low albumin 8. Hypotension: Monitor BP tid--off Cozaar at this time.   4/5 soft/ normal 9. ABLA due to UGIB s/p hemo clip:   Hemoglobin 9.8 on 3/24--->10.7 3/28 and again 4/4  10 Leukocytosis:   -likely related to steroids and DVT. No fever, s/s infection  currently  WBCs 15.8 on 3/24-->13.1 3/28--> 8.5 4/4  Decadron 2mg  daily--stopped d/t wound drainage 11. Cerebral edema:   -CT findings appear to be what would be expected post-op  -steroids as above 12. 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   4/2-no seizures in the last 24 hours-continue Keppra 13. Constipation:   Appears to be improving per documentation  4/2-last bowel movement yesterday; continue regimen 14. Hyponatremia:   Sodium 135 on 3/24-->136 on 3/28-->135 4/4   15. Drainage from Crani site-  4/6, decreased this morning, continue doxycycline   -afebrile    -wbc 8.5 4/4    -continue to keep area  dressed, dry   LOS: 19 days A FACE TO FACE EVALUATION WAS PERFORMED  Meredith Staggers 09/15/2020, 8:27 AM

## 2020-09-15 NOTE — Progress Notes (Signed)
Physical Therapy Session Note  Patient Details  Name: Todd Villa MRN: [id number] Date of Birth: June 20, 1970  Today's Date: 09/15/2020 PT Individual Time: 0804-0900 PT Individual Time Calculation (min): 56 min   Short Term Goals: Week 3:  PT Short Term Goal 1 (Week 3): STGs = LTGs  Skilled Therapeutic Interventions/Progress Updates:     Pt received supine in bed and agrees to therapy. Reports some pain in head. Number not provided. PT provides rest breaks to manage pain. Pt performs supine to sit with bed features and cues for body mechanics and logrolling technique. Stand pivot transfer to [facility] with modA, toward pt's R. Pt noted to be incontinent of bowel. Pt performs sit to stand in Steady with CGA. Pt then performs multiple reps of sit to stand in stedy to assist with pericare, with cues for midline orientation and use of R and L upper extremity for stability. Pt transported via WC to gym for time management. Stand pivot to mat table with modA toward R side. Pt then performs sit<>supine with supervision and cues for sequencing and positioning. Stand pivot back to WC with modA. Pt left seated in TIS WC with alarm intact and all needs within reach.  Therapy Documentation Precautions:  Precautions Precautions: None Restrictions Weight Bearing Restrictions: No LUE Weight Bearing: Weight bearing as tolerated RLE Weight Bearing: Weight bearing as tolerated LLE Weight Bearing: Weight bearing as tolerated   Therapy/Group: Individual Therapy  Breck Coons, PT, DPT 09/15/2020, 4:16 PM

## 2020-09-15 NOTE — Progress Notes (Signed)
Speech Language Pathology Daily Session Note  Patient Details  Name: Todd Villa MRN: [id number] Date of Birth: June 10, 1971  Today's Date: 09/15/2020 SLP Individual Time: 6314-9702 SLP Individual Time Calculation (min): 55 min  Short Term Goals: Week 3: SLP Short Term Goal 1 (Week 3): Patient will identify 2 physical and 2 cognitive changes with Min verbal cues. SLP Short Term Goal 2 (Week 3): Patient will orient to place, time and situation with supervision verbal and visual cues. SLP Short Term Goal 3 (Week 3): Patient will demonstrate selective attention to functional tasks in a mildly disctrating enviornment for ~30 minutes with Min verbal cues for redirection. SLP Short Term Goal 4 (Week 3): Patient will demonstrate functional problem solving for basic and familiar tasks with Min A verbal and visual cues. SLP Short Term Goal 5 (Week 3): Pt will increase left field scanning to increase safety during functional activities (mobility, reading, writing, etc) provided supervision A verbal and visual cues  Skilled Therapeutic Interventions: Skilled treatment session focused on cognitive goals. SLP facilitated session by re-administering the [facility] Mental Status Examination (SLUMS) and patient scored  18/30 points with a score of 26 or above considered normal. Patient demonstrated deficits in short-term recall, problem solving, and recall. Although patient's score remained the same since initial evaluation, patient has made gains functionally in problem solving, recall, attention and awareness. SLP also facilitated functional conversation that focused on discharge planning. Patient left upright in bed with alarm on and all needs within reach. Continue with current plan of care.       Pain Mild headache 4/10 RN made aware  Therapy/Group: Individual Therapy  Kory Rains 09/15/2020, 3:10 PM

## 2020-09-15 NOTE — Progress Notes (Signed)
Patient will be transferring to [facility], report called to Dorian Pod, RN @1618 .  Patient and wife notified of transfer and verbalized understanding.  Awaiting transport for pick up.

## 2020-09-15 NOTE — Progress Notes (Signed)
Occupational Therapy Session Note  Patient Details  Name: Todd Villa MRN: [id number] Date of Birth: Aug 02, 1970  Today's Date: 09/15/2020 OT Individual Time: 0905-1000 OT Individual Time Calculation (min): 55 min    Short Term Goals: Week 3:  OT Short Term Goal 1 (Week 3): Patient will complete slideboard transfer to wheelchair with mod A OT Short Term Goal 2 (Week 3): Patient will complete sit<>stand at the sink in prep for BADL task with mod A of 1 OT Short Term Goal 3 (Week 3): Patient will recall hemi dressing techniuqes with min questioning cues  Skilled Therapeutic Interventions/Progress Updates:    1:1. Pt received in TIS with direct handoff to OT. Pt agreeable to shower. Pt utilizes transfer to/from TTB with stedy and MIN A. Pt requires overall MIN A in shower to wash buttocks and RUE with LUE HOH A. Pt requires MIN A Overall for UB dressing and MAX A sit to stand for LB dressing and total A for footwear. Pt requries cuing for midline orientation at sink with hand on R of sink and use of mirror for visual feedback. Total A transport to outside courtyard where OT faciliatates activation of RUE with deep weight bearing and AAROM for NMR. Exited session with pt seated in bed, exit alarm on and call light in reach   Therapy Documentation Precautions:  Precautions Precautions: None Restrictions Weight Bearing Restrictions: No LUE Weight Bearing: Non weight bearing RLE Weight Bearing: Weight bearing as tolerated LLE Weight Bearing: Weight bearing as tolerated General:   Vital Signs: Therapy Vitals Temp: 97.8 F (36.6 C) Pulse Rate: 70 Resp: 17 BP: 98/76 Patient Position (if appropriate): Lying Oxygen Therapy SpO2: 97 % O2 Device: Room Air Pain:   ADL:   Vision   Perception    Praxis   Exercises:   Other Treatments:     Therapy/Group: Individual Therapy  Tonny Branch 09/15/2020, 6:47 AM

## 2020-09-15 NOTE — Progress Notes (Signed)
Patient ID: Todd Villa, male   DOB: Feb 08, 1971, 50 y.o.   MRN: [id number]  SW received updates pt now has bed available at Neuro unit at [place] [phone]). SW Spoke with [facility] [phone]) to request transportation. SW called [name] to provide updates on pt transition today.   Loralee Pacas, MSW, [place] Office: [phone] Cell: [phone] Fax: [phone]

## 2020-09-15 NOTE — Progress Notes (Signed)
Patient evaluated --lying in bed.  Alert and oriented X 4. Minimal dysarthria and continues to have left hemiplegia.  Lungs clear. Heart-RRR. Abdomen- SNT, +BS. Scalp: Incision with moderate serous drainage on dressing. Ext: MIn edema LUE/LLE. Warm with + pulses.

## 2020-09-15 NOTE — Progress Notes (Signed)
Patient p/u by [facility] for transport via ambulance. Gave report, and documentation.

## 2020-09-15 NOTE — Progress Notes (Signed)
[facility] [employer] Chief [employer] called to request copy of Covid-19 result from 09/15/2020 be faxed to [phone]. Faxed and received confirmation that it was received.

## 2020-09-16 LAB — PATHOLOGIST SMEAR REVIEW: Path Review: 4072022

## 2020-09-16 NOTE — Progress Notes (Signed)
Inpatient Rehabilitation Care Coordinator Discharge Note  The overall goal for the admission was met for:   Discharge location: Yes. Transfer to [facility] for procedure. Neuro unit at [place] [phone]).  Length of Stay: Yes. 19 days.   Discharge activity level: Yes. Max A  Home/community participation: Yes. Limited.   Services provided included: MD, RD, PT, OT, SLP, RN, CM, TR, Pharmacy, Neuropsych and SW  Financial Services: Private Insurance: [employer]  Choices offered to/list presented to:NA  Follow-up services arranged: N/A  Comments (or additional information):  Patient/Family verbalized understanding of follow-up arrangements: Yes  Individual responsible for coordination of the follow-up plan: contact pt wife Maudie Mercury [phone]  Confirmed correct DME delivered: Rana Snare 09/16/2020    Rana Snare

## 2020-09-17 ENCOUNTER — Encounter: Payer: Self-pay | Admitting: Internal Medicine

## 2020-09-17 ENCOUNTER — Ambulatory Visit: Payer: Commercial Managed Care - PPO | Admitting: Internal Medicine

## 2020-09-20 ENCOUNTER — Ambulatory Visit: Payer: Commercial Managed Care - PPO | Admitting: Radiation Oncology

## 2020-09-24 ENCOUNTER — Telehealth: Payer: Self-pay | Admitting: *Deleted

## 2020-09-24 ENCOUNTER — Ambulatory Visit
Admission: RE | Admit: 2020-09-24 | Discharge: 2020-09-24 | Disposition: A | Discharge status: Home / Self Care | Payer: Commercial Managed Care - PPO | Source: Ambulatory Visit | Attending: Radiation Oncology | Admitting: Radiation Oncology

## 2020-09-24 ENCOUNTER — Encounter: Payer: Self-pay | Admitting: Internal Medicine

## 2020-09-24 ENCOUNTER — Inpatient Hospital Stay: Payer: Commercial Managed Care - PPO | Attending: Internal Medicine | Admitting: Internal Medicine

## 2020-09-24 VITALS — BP 120/88 | HR 97 | Temp 97.8°F | Resp 20

## 2020-09-24 DIAGNOSIS — C719 Malignant neoplasm of brain, unspecified: Secondary | ICD-10-CM

## 2020-09-24 DIAGNOSIS — G8194 Hemiplegia, unspecified affecting left nondominant side: Secondary | ICD-10-CM

## 2020-09-24 DIAGNOSIS — C712 Malignant neoplasm of temporal lobe: Secondary | ICD-10-CM

## 2020-09-24 DIAGNOSIS — R569 Unspecified convulsions: Secondary | ICD-10-CM

## 2020-09-24 NOTE — Progress Notes (Signed)
[facility] [place], [place] [address] [phone]   New Patient Evaluation  Date of Service: 09/24/20 Patient Name: Todd Villa Patient MRN: [id number] Patient DOB: 03-12-1971 Provider: Ventura Sellers, MD  Identifying Statement:  Todd Villa is a 50 y.o. male with right temporal glioblastoma who presents for initial consultation and evaluation.    Referring Provider: [place],  [place] [address]  Oncologic History: Oncology History  Glioblastoma multiforme of brain [facility])  08/19/2020 Surgery   Craniotomy, resection of right temporal mass with Dr. Lacinda Axon; path demonstrates Glioblastoma IDH-1 wild type   09/16/2020 Surgery   Wound flap washout with Dr. Lacinda Axon; collection speciates proteus.  PICC line placed for cefepime therapy.     Biomarkers:  MGMT Unknown.  IDH 1/2 Wild type.  EGFR Unknown  TERT Unknown   History of Present Illness: The patient's records from the referring physician were obtained and reviewed and the patient interviewed to confirm this HPI.  Todd Villa presents to clinic today for clinical review and treatment planning following repeat craniotomy last week.  During rehabilitation stay he developed soft tissue infection, was transferred to [facility] under care of Dr. Lacinda Axon who performed craniotomy and washout.  He was initially placed on IV antibiotics (cefepime), but was transitioned to ciprofloxacin upon discharge, which he is taking currently.  He continues to complain of dense left sided weakness, modestly improved from prior.  He has been back working (from home) mostly using telephone and computer.  Does complain of mild fatigue and has been napping daily.      Medications: Current Outpatient Medications on File Prior to Visit  Medication Sig Dispense Refill  . acetaminophen (TYLENOL) 325 MG tablet Take 1,000 mg by mouth.    Marland Kitchen apixaban (ELIQUIS) 5 MG  TABS tablet     . atorvastatin (LIPITOR) 10 MG tablet Take 10 mg by mouth daily.    . bacitracin ointment Apply topically 2 (two) times daily. 120 g 0  . ciprofloxacin (CIPRO) 750 MG tablet Take by mouth.    Marland Kitchen FLUoxetine (PROZAC) 20 MG capsule Take 20 mg by mouth daily.    Marland Kitchen levETIRAcetam (KEPPRA) 1000 MG tablet Take 1,000 mg by mouth every 12 (twelve) hours.    Marland Kitchen oxyCODONE (OXY IR/ROXICODONE) 5 MG immediate release tablet Take 5 mg by mouth every 4 (four) hours as needed (for pain not relieved by tylenol).    . pantoprazole (PROTONIX) 40 MG tablet Take 40 mg by mouth daily.    Marland Kitchen senna-docusate (SENOKOT-S) 8.6-50 MG tablet Take 2 tablets by mouth 2 (two) times daily.    Marland Kitchen acetaminophen (TYLENOL) 325 MG tablet Take 2 tablets (650 mg total) by mouth 4 (four) times daily -  with meals and at bedtime.    . ALPRAZolam (XANAX) 0.5 MG tablet Take 1 tablet (0.5 mg total) by mouth 2 (two) times daily as needed for anxiety.  0  . bacitracin 500 UNIT/GM ointment Apply 1 application topically daily. Apply to incision site for 14 days.    Marland Kitchen doxycycline (VIBRA-TABS) 100 MG tablet Take 1 tablet (100 mg total) by mouth every 12 (twelve) hours.    . melatonin 3 MG TABS tablet Take 1 tablet (3 mg total) by mouth at bedtime.  0  . polyethylene glycol (MIRALAX / GLYCOLAX) 17 g packet Take 17 g by mouth daily.    . QUEtiapine (SEROQUEL) 50 MG tablet Take  1 tablet (50 mg total) by mouth 2 (two) times daily.    Marland Kitchen topiramate (TOPAMAX) 50 MG tablet Take 1 tablet (50 mg total) by mouth at bedtime.    . traZODone (DESYREL) 100 MG tablet Take 1 tablet (100 mg total) by mouth at bedtime.     No current facility-administered medications on file prior to visit.    Allergies:  Allergies  Allergen Reactions  . Nuedexta [Dextromethorphan-Quinidine]     Psychosis   . Penicillins Other (See Comments)    Childhood reaction - patient is unsure of what happened.   Past Medical History:  Past Medical History:  Diagnosis  Date  . High blood pressure   . Obesity   . 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 IR IVC FILTER PLMT / S&I /IMG GUID/MOD SED  08/29/2020  . ROTATOR CUFF REPAIR Left 2015   Social History:  Social History   Socioeconomic History  . Marital status: Married    Spouse name: Not on file  . Number of children: Not on file  . Years of education: Not on file  . Highest education level: Not on file  Occupational History  . Not on file  Tobacco Use  . Smoking status: Current Every Day Smoker    Packs/day: 0.66    Years: 30.00    Pack years: 19.80    Types: Cigarettes  . Smokeless tobacco: Never Used  Substance and Sexual Activity  . Alcohol use: No    Alcohol/week: 0.0 standard drinks  . Drug use: No  . Sexual activity: Yes  Other Topics Concern  . Not on file  Social History Narrative  . Not on file   Social Determinants of Health   Financial Resource Strain: Not on file  Food Insecurity: Not on file  Transportation Needs: Not on file  Physical Activity: Not on file  Stress: Not on file  Social Connections: Not on file  Intimate Partner Violence: Not on file   Family History:  Family History  Problem Relation Age of Onset  . Hyperlipidemia Mother   . Heart disease Mother   . Hypertension Mother   . Diabetes Mother   . Alcohol abuse Father   . Heart disease Father   . Hypertension Father   . Diabetes Sister   . Cancer Neg Hx   . Stroke Neg Hx     Review of Systems: Constitutional: Doesn't report fevers, chills or abnormal weight loss Eyes: Doesn't report blurriness of vision Ears, nose, mouth, throat, and face: Doesn't report sore throat Respiratory: Doesn't report cough, dyspnea or wheezes Cardiovascular: Doesn't report palpitation, chest discomfort  Gastrointestinal:  Doesn't report nausea, constipation, diarrhea GU: Doesn't report incontinence Skin: Doesn't report skin  rashes Neurological: Per HPI Musculoskeletal: Doesn't report joint pain Behavioral/Psych: Doesn't report anxiety  Physical Exam: Vitals:   09/24/20 0903  BP: 120/88  Pulse: 97  Resp: 20  Temp: 97.8 F (36.6 C)  SpO2: 99%   KPS: 60. General: Alert, cooperative, pleasant, in no acute distress Head: Normal EENT: No conjunctival injection or scleral icterus.  Lungs: Resp effort normal Cardiac: Regular rate Abdomen: Non-distended abdomen Skin: No rashes cyanosis or petechiae. Extremities: No clubbing or edema  Neurologic Exam: Mental Status: Awake, alert, attentive to examiner. Oriented to self and environment. Language is fluent with intact comprehension.  Cranial Nerves: Visual acuity is grossly normal. Visual fields are full. Extra-ocular movements intact. No ptosis. Face  is symmetric Motor: Tone and bulk are normal. He is near plegic in left arm and leg, no significant antigravity movement. Reflexes are symmetric, no pathologic reflexes present.  Sensory: Intact to light touch Gait: Deferred   Labs: I have reviewed the data as listed    Component Value Date/Time   NA 139 09/15/2020 1513   NA 137 07/14/2013 1031   K 3.5 09/15/2020 1513   K 3.8 07/14/2013 1031   CL 106 09/15/2020 1513   CL 107 07/14/2013 1031   CO2 25 09/15/2020 1513   CO2 25 07/14/2013 1031   GLUCOSE 94 09/15/2020 1513   GLUCOSE 125 (H) 07/14/2013 1031   BUN 18 09/15/2020 1513   BUN 9 07/14/2013 1031   CREATININE 0.72 09/15/2020 1513   CREATININE 0.98 07/14/2013 1031   CALCIUM 8.8 (L) 09/15/2020 1513   CALCIUM 8.7 07/14/2013 1031   PROT 5.0 (L) 08/28/2020 0454   ALBUMIN 2.6 (L) 08/28/2020 0454   AST 20 08/28/2020 0454   ALT 27 08/28/2020 0454   ALKPHOS 34 (L) 08/28/2020 0454   BILITOT 0.7 08/28/2020 0454   GFRNONAA >60 09/15/2020 1513   GFRNONAA >60 07/14/2013 1031   GFRAA >60 07/14/2013 1031   Lab Results  Component Value Date   WBC 8.7 09/15/2020   NEUTROABS 4.7 09/15/2020   HGB 11.4  (L) 09/15/2020   HCT 35.2 (L) 09/15/2020   MCV 95.9 09/15/2020   PLT 375 09/15/2020    Imaging:  CT HEAD WO CONTRAST  Addendum Date: 08/30/2020   ADDENDUM REPORT: 08/30/2020 21:46 ADDENDUM: These results were called by telephone at the time of interpretation on 08/30/2020 at 9:46 pm to provider PA Hanson, who verbally acknowledged these results. Electronically Signed   By: Kellie Simmering DO   On: 08/30/2020 21:46   Result Date: 08/30/2020 CLINICAL DATA:  Mental status change, unknown cause; headaches, visual changes-new, GBM with surgery at [facility]. EXAM: CT HEAD WITHOUT CONTRAST TECHNIQUE: Contiguous axial images were obtained from the base of the skull through the vertex without intravenous contrast. COMPARISON:  Prior brain MRI 08/17/2020. FINDINGS: Brain: Cerebral volume is normal for age. Since the prior MRI of 08/17/2020, there has been interval right-sided craniotomy/cranioplasty for resection of a right cerebral mass. Also new from this prior exam, there is a cystic focus within the right frontoparietal lobes extending inferiorly toward the right basal ganglia and thalamus measuring 4.2 x 4.8 x 4.6 cm (AP x TV x CC) compatible with a resection cavity. Persistent fairly extensive predominantly white matter hypoattenuation within the surrounding right frontoparietal lobes extending into the white matter tracks inferiorly and also into the right temporal lobe. Findings may reflect edema and/or infiltrative tumor. Also new from the prior exam, there is a mixed attenuation extra-axial fluid collection overlying the right frontoparietal lobes measuring up to 8 mm in greatest thickness. There are hyperdense components within the collection posteriorly, compatible with a small amount of acute/subacute hemorrhage (for instance as seen on series 5, image 53). Persistent mass effect with partial effacement of the right lateral ventricle. 6 mm leftward midline shift measured at the level of the septum pellucidum  (previously 7 mm). No definite demarcated cortical infarct. Vascular: No hyperdense vessel.  Atherosclerotic calcifications. Skull: Interval right craniotomy/cranioplasty. No calvarial fracture. Sinuses/Orbits: Visualized orbits show no acute finding. Mild mucosal thickening within the inferior left maxillary sinus. Trace bilateral ethmoid sinus mucosal thickening. Other: Scalp fluid collection overlying the cranioplasty measuring up to 15 mm in thickness (for instance as seen on  series 3, image 20). IMPRESSION: There has been interval, presumed partial, resection of a tumor centered within the deep right frontoparietal region. 4.2 x 4.8 x 4.6 cm resection cavity at this site. Persistent fairly extensive predominantly white matter hypoattenuation within the surrounding frontoparietal lobes extending inferiorly and also into the right temporal lobe. This likely reflects a combination of edema and residual infiltrative tumor. Persistent mass effect with partial effacement of the right lateral ventricle. 6 mm leftward midline shift (previously 7 mm). New mixed attenuation extra-axial collection overlying the right frontoparietal lobes measuring up to 8 mm in greatest thickness. Small amount of acute/early subacute hemorrhage within the collection posteriorly. Scalp fluid collection overlying the cranioplasty measuring up to 15 mm in thickness. Mild paranasal sinus disease as described. Electronically Signed: By: Kellie Simmering DO On: 08/30/2020 20:07   IR IVC FILTER PLMT / S&I Burke Keels GUID/MOD SED  Result Date: 08/29/2020 CLINICAL DATA:  DVT. Recent intracranial surgery, a relative contraindication to anticoagulation. Caval filtration requested. EXAM: INFERIOR VENACAVOGRAM IVC FILTER PLACEMENT UNDER FLUOROSCOPY FLUOROSCOPY TIME:  1 minutes 24 seconds; 121 mGy TECHNIQUE: Patency of the right IJ vein was confirmed with ultrasound with image documentation. An appropriate skin site was determined. Skin site was marked,  prepped with chlorhexidine, and draped using maximum barrier technique. The region was infiltrated locally with 1% lidocaine. Intravenous Fentanyl 107mg and Versed 145mwere administered as conscious sedation during continuous monitoring of the patient's level of consciousness and physiological / cardiorespiratory status by the radiology RN, with a total moderate sedation time of 42 minutes. Under real-time ultrasound guidance, the right IJ vein was accessed with a 21 gauge micropuncture needle; the needle tip within the vein was confirmed with ultrasound image documentation. The needle was exchanged over a 018 guidewire for a transitional dilator, which allow advancement of the [facility] into the IVC. A long 6 French vascular sheath was placed for inferior venacavography. This demonstrated no caval thrombus. Renal vein inflows were evident. The [facility] filter was advanced through the sheath and successfully deployed under fluoroscopy at the L2 level. Followup cavagram demonstrates stable filter position and no evident complication. The sheath was removed and hemostasis achieved at the site. No immediate complication. IMPRESSION: 1. Normal IVC. No thrombus or significant anatomic variation. 2. Technically successful infrarenal IVC filter placement. This is a retrievable model. PLAN: This IVC filter is potentially retrievable. The patient will be assessed for filter retrieval by Interventional Radiology in approximately 8-12 weeks. Further recommendations regarding filter retrieval, continued surveillance or declaration of device permanence, will be made at that time. Electronically Signed   By: D Lucrezia Europe.D.   On: 08/29/2020 13:59   VAS USK[place]OWER EXTREMITY VENOUS (DVT)  Result Date: 08/29/2020  Lower Venous DVT Study Indications: Edema.  Risk Factors: None identified. Limitations: Body habitus and poor ultrasound/tissue interface. Comparison Study: No prior studies. Performing Technologist: GrOliver HumRVT  Examination Guidelines: A complete evaluation includes B-mode imaging, spectral Doppler, color Doppler, and power Doppler as needed of all accessible portions of each vessel. Bilateral testing is considered an integral part of a complete examination. Limited examinations for reoccurring indications may be performed as noted. The reflux portion of the exam is performed with the patient in reverse Trendelenburg.  +---------+---------------+---------+-----------+----------+--------------+ RIGHT    CompressibilityPhasicitySpontaneityPropertiesThrombus Aging +---------+---------------+---------+-----------+----------+--------------+ CFV      Full           Yes      Yes                                 +---------+---------------+---------+-----------+----------+--------------+  SFJ      Full                                                        +---------+---------------+---------+-----------+----------+--------------+ FV Prox  Full                                                        +---------+---------------+---------+-----------+----------+--------------+ FV Mid   Full                                                        +---------+---------------+---------+-----------+----------+--------------+ FV DistalFull                                                        +---------+---------------+---------+-----------+----------+--------------+ PFV      Full                                                        +---------+---------------+---------+-----------+----------+--------------+ POP      Full           Yes      Yes                                 +---------+---------------+---------+-----------+----------+--------------+ PTV      Full                                                        +---------+---------------+---------+-----------+----------+--------------+ PERO     Full                                                         +---------+---------------+---------+-----------+----------+--------------+   +---------+---------------+---------+-----------+----------+--------------+ LEFT     CompressibilityPhasicitySpontaneityPropertiesThrombus Aging +---------+---------------+---------+-----------+----------+--------------+ CFV      Full           Yes      Yes                                 +---------+---------------+---------+-----------+----------+--------------+ SFJ      Full                                                        +---------+---------------+---------+-----------+----------+--------------+  FV Prox  Full                                                        +---------+---------------+---------+-----------+----------+--------------+ FV Mid   Full                                                        +---------+---------------+---------+-----------+----------+--------------+ FV DistalFull                                                        +---------+---------------+---------+-----------+----------+--------------+ PFV      Full                                                        +---------+---------------+---------+-----------+----------+--------------+ POP      Partial        No       No                   Acute          +---------+---------------+---------+-----------+----------+--------------+ PTV      Full                                                        +---------+---------------+---------+-----------+----------+--------------+ PERO     Partial                                      Acute          +---------+---------------+---------+-----------+----------+--------------+ Gastroc  Full                                                        +---------+---------------+---------+-----------+----------+--------------+     Summary: RIGHT: - There is no evidence of deep vein thrombosis in the lower extremity.  - No cystic structure found in  the popliteal fossa.  LEFT: - Findings consistent with acute deep vein thrombosis involving the left popliteal vein, and left peroneal veins. - No cystic structure found in the popliteal fossa.  *See table(s) above for measurements and observations. Electronically signed by Ruta Hinds MD on 08/29/2020 at 11:13:43 AM.    Final     Pathology: Case Report  Surgical Pathology                Case: [id number]                 Authorizing Provider: Malen Gauze, MD  Collected:      08/19/2020 [place]        Ordering Location:   [facility]  Received:      08/20/2020 [place]                   Periop                                    Pathologist:      Alesia Richards, MD                           Intraop:       Jeris Penta, MD                           Specimens:  A) - Tissue, right brain tumor tissue                                B) - Tissue, right brain tumor                              Addendum    Based on IDH sequencing (see [id number]) the final integrated diagnosis is:  A-B. Brain, right frontoparietal mass, resection:  Glioblastoma, IDH-wildtype, CNS WHO grade 4.  Addendum electronically signed by Alesia Richards, MD on 09/02/2020 at 1:36 PM  DIAGNOSIS    A-B. Brain, right frontoparietal mass, resection:  High-grade infiltrating glioma, pending molecular studies for an integrated CNS WHO diagnosis.   Comment: The integrated diagnosis will be reported in an addendum when molecular studies are complete.     Assessment/Plan Glioblastoma multiforme of brain ([place]) [C71.9]  We appreciate the opportunity to participate in the care of Todd Villa.  He is clinically stable with regards to focal deficits from tumor and  initial resection.  Still living with dense functional impairment on the left, confined to a wheelchair and reliant on family for ADLs.    We had an extensive conversation with him regarding pathology, prognosis, and available treatment pathways.  His prognosis is limited by the well understood aggressive nature of glioblastoma, but also because of his functional impairments.    We ultimately recommended proceeding with course of intensity modulated radiation therapy and concurrent daily Temozolomide.  Radiation will be administered Mon-Fri over 6 weeks, Temodar will be dosed at 44m/m2 to be given daily over 42 days.  We reviewed side effects of temodar, including fatigue, nausea/vomiting, constipation, and cytopenias.  Informed consent was verbally obtained at bedside to proceed with oral chemotherapy.  Chemotherapy should be held for the following:  ANC less than 1,000  Platelets less than 100,000  LFT or creatinine greater than 2x ULN  If clinical concerns/contraindications develop  We will recommend he complete antibiotic course before initiating radiation and Temodar.  Will plan to obtain an MRI brain prior to radiation planning given the long time since initial craniotomy.  Should continue Keppra 10027mBID.  Every 2 weeks during radiation, labs will be checked accompanied by a clinical evaluation in the brain tumor clinic.  Screening for potential clinical trials was performed and discussed using eligibility criteria for active protocols at [facility], as well as national database available on [email or web address]  We spent twenty additional minutes teaching regarding the natural history, biology, and historical experience in the treatment of brain tumors. We then discussed in detail the current recommendations for therapy focusing on the mode of administration, mechanism of action, anticipated toxicities, and quality of life issues associated with  this plan. We also provided teaching sheets for the patient to take home as an additional resource.  All questions were answered. The patient knows to call the clinic with any problems, questions or concerns. No barriers to learning were detected.  The total time spent in the encounter was 60 minutes and more than 50% was on counseling and review of test results   Ventura Sellers, MD Medical Director of Neuro-Oncology [facility] at [place] 09/24/20 9:03 AM

## 2020-09-24 NOTE — Consult Note (Signed)
NEW PATIENT EVALUATION  Name: Todd Villa  MRN: [id number]  Date:   09/24/2020     DOB: 29-Mar-1971   This 50 y.o. 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  REFERRING PHYSICIAN: Victor: No chief complaint on file.   DIAGNOSIS: There were no encounter diagnoses.   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  He was noted back in March after left-sided weakness worsened and was found on MRI scan to have a large heterogeneous mass lesion centered in the deep right frontoparietal region extending to the right basal ganglia measuring 4 x 3.8 x 3.6 cm concerning for high-grade neoplasm versus metastasis.  There was significant surrounding vasogenic edema.  Patient was transferred to [facility] where he underwent craniotomy after CT scans of abdomen chest and pelvis revealed no evidence of primary tumor.  He underwent a right-sided craniotomy with brain tumor resection by Dr. Lacinda Axon for high-grade glioma GBM.  His surgery was complicated by increased somnolence and was found to have a GI bleed which was treated by upper endoscopy.  He otherwise did well although needed reexcision for wound flap washout andcefepime therapy was instituted.  He is now referred to radiation oncology for opinion.  He is doing fairly well still has significant left sided weakness.  He is also being [facility] at [facility] by infectious disease.  He states some of his left-sided weakness has modestly improved.  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    PAST SURGICAL HISTORY:  Past Surgical  History:  Procedure Laterality Date  . IR IVC FILTER PLMT / S&I /IMG GUID/MOD SED  08/29/2020  . ROTATOR CUFF REPAIR Left 2015    FAMILY HISTORY: family history includes Alcohol abuse in his father; Diabetes in his mother and sister; Heart disease in his father and mother; Hyperlipidemia in his mother; Hypertension in his father and mother.  SOCIAL HISTORY:  reports that he has been smoking cigarettes. He has a 19.80 pack-year smoking history. He has never used smokeless tobacco. He reports that he does not drink alcohol and does not use drugs.  ALLERGIES: Nuedexta [dextromethorphan-quinidine] and Penicillins  MEDICATIONS:  Current Outpatient Medications  Medication Sig Dispense Refill  . acetaminophen (TYLENOL) 325 MG tablet Take 2 tablets (650 mg total) by mouth 4 (four) times daily -  with meals and at bedtime.    Marland Kitchen acetaminophen (TYLENOL) 325 MG tablet Take 1,000 mg by mouth.    Marland Kitchen apixaban (ELIQUIS) 5 MG TABS tablet     . atorvastatin (LIPITOR) 10 MG tablet Take 10 mg by mouth daily.    . bacitracin 500 UNIT/GM ointment Apply 1 application topically daily. Apply to incision site for 14 days.    . bacitracin ointment Apply topically 2 (two) times daily. 120 g 0  . ciprofloxacin (CIPRO) 750 MG tablet Take by mouth.    . doxycycline (VIBRA-TABS) 100 MG tablet Take 1 tablet (100 mg total) by mouth every 12 (twelve) hours.    Marland Kitchen FLUoxetine (PROZAC) 20 MG capsule Take 20 mg by mouth daily.    Marland Kitchen levETIRAcetam (KEPPRA) 1000 MG tablet Take 1,000 mg by  mouth every 12 (twelve) hours.    . melatonin 3 MG TABS tablet Take 1 tablet (3 mg total) by mouth at bedtime.  0  . oxyCODONE (OXY IR/ROXICODONE) 5 MG immediate release tablet Take 5 mg by mouth every 4 (four) hours as needed (for pain not relieved by tylenol).    . pantoprazole (PROTONIX) 40 MG tablet Take 40 mg by mouth daily.    . polyethylene glycol (MIRALAX / GLYCOLAX) 17 g packet Take 17 g by mouth daily.    Marland Kitchen senna-docusate (SENOKOT-S)  8.6-50 MG tablet Take 2 tablets by mouth 2 (two) times daily.     No current facility-administered medications for this encounter.    ECOG PERFORMANCE STATUS:  2 - Symptomatic, <50% confined to bed  REVIEW OF SYSTEMS: Patient denies any weight loss, fatigue, weakness, fever, chills or night sweats. Patient denies any loss of vision, blurred vision. Patient denies any ringing  of the ears or hearing loss. No irregular heartbeat. Patient denies heart murmur or history of fainting. Patient denies any chest pain or pain radiating to her upper extremities. Patient denies any shortness of breath, difficulty breathing at night, cough or hemoptysis. Patient denies any swelling in the lower legs. Patient denies any nausea vomiting, vomiting of blood, or coffee ground material in the vomitus. Patient denies any stomach pain. Patient states has had normal bowel movements no significant constipation or diarrhea. Patient denies any dysuria, hematuria or significant nocturia. Patient denies any problems walking, swelling in the joints or loss of balance. Patient denies any skin changes, loss of hair or loss of weight. Patient denies any excessive worrying or anxiety or significant depression. Patient denies any problems with insomnia. Patient denies excessive thirst, polyuria, polydipsia. Patient denies any swollen glands, patient denies easy bruising or easy bleeding. Patient denies any recent infections, allergies or URI. Patient "s visual fields have not changed significantly in recent time.   PHYSICAL EXAM: There were no vitals taken for this visit. Patient has profound left-sided weakness.  Crude visual fields are within normal range right side no focal neurologic deficits.  Well-developed well-nourished patient in NAD. HEENT reveals PERLA, EOMI, discs not visualized.  Oral cavity is clear. No oral mucosal lesions are identified. Neck is clear without evidence of cervical or supraclavicular adenopathy. Lungs are  clear to A&P. Cardiac examination is essentially unremarkable with regular rate and rhythm without murmur rub or thrill. Abdomen is benign with no organomegaly or masses noted. Motor sensory and DTR levels are equal and symmetric in the upper and lower extremities. Cranial nerves II through XII are grossly intact. Proprioception is intact. No peripheral adenopathy or edema is identified. No motor or sensory levels are noted. Crude visual fields are within normal range.  LABORATORY DATA: Pathology report reviewed    RADIOLOGY RESULTS: MRI and CT scans reviewed compatible with above-stated findings   IMPRESSION: Resected GBM in 50 year old male for adjuvant therapy  PLAN: At this time elect to go ahead with adjuvant radiation therapy.  Would use IMRT treatment planning delivery.  We will plan delivering 60 Gray over 6 weeks.  Patient is also seen neuro oncology and will be receiving oral Temodar.  Risks and benefits of treatment occluding hair loss fatigue alteration of blood counts all were discussed in detail with the patient.  I am putting off his treatment planning till early May when he has been cleared by ID.  He will also be having repeat MRI scan performed which I would like to evaluate to  assess any possible gross residual disease prior to treatment planning.  Patient and wife both comprehend my treatment plan well.  I discussed case personally with Dr. Mickeal Skinner who is in agreement.  I would like to take this opportunity to thank you for allowing me to participate in the care of your patient.Noreene Filbert, MD

## 2020-09-29 ENCOUNTER — Telehealth: Payer: Self-pay | Admitting: Pharmacist

## 2020-09-29 ENCOUNTER — Other Ambulatory Visit (HOSPITAL_COMMUNITY): Payer: Self-pay

## 2020-09-29 ENCOUNTER — Telehealth: Payer: Self-pay | Admitting: Radiation Oncology

## 2020-09-29 DIAGNOSIS — C719 Malignant neoplasm of brain, unspecified: Secondary | ICD-10-CM

## 2020-09-29 NOTE — Telephone Encounter (Signed)
Oral Oncology Pharmacist Encounter  Received new prescription for Temodar (temozolomide) for the treatment of right temporal GBM in conjunction with XRT, planned duration until the end of radiation. Neuro-oncology appt on 10/15/20. CT Sim scheduled for 10/18/20  Prescription dose and frequency assessed.   Current medication list in Epic reviewed, no DDIs with temozolomide identified.  Evaluated chart and no patient barriers to medication adherence identified.   Oral Oncology Clinic will continue to follow for insurance authorization, copayment issues, initial counseling and start date.  Patient agreed to treatment on 09/24/20 per MD documentation.  Darl Pikes, PharmD, BCPS, BCOP, CPP Hematology/Oncology Clinical Pharmacist Practitioner [facility] [phone]  09/29/2020 2:14 PM

## 2020-09-29 NOTE — Telephone Encounter (Signed)
Error

## 2020-09-29 NOTE — Telephone Encounter (Signed)
Patient's wife Todd Villa left message on answering service with questions about a referral made to Dr. Renda Rolls office and would like a return call.

## 2020-10-01 ENCOUNTER — Other Ambulatory Visit (HOSPITAL_COMMUNITY): Payer: Self-pay

## 2020-10-01 ENCOUNTER — Telehealth: Payer: Self-pay | Admitting: Pharmacy Technician

## 2020-10-01 ENCOUNTER — Ambulatory Visit: Payer: Commercial Managed Care - PPO | Admitting: Internal Medicine

## 2020-10-01 NOTE — Telephone Encounter (Signed)
Oral Oncology Patient Advocate Encounter  Prior Authorization for Temozolomide has been approved.    PA# [id number] Effective dates: 10/01/20 through 10/01/21  Patient must use [employer] per insurance.  Oral Oncology Clinic will continue to follow.   [place] Patient [place] Phone [phone] Fax [phone] 10/01/2020 10:30 AM

## 2020-10-01 NOTE — Telephone Encounter (Signed)
Oral Oncology Patient Advocate Encounter  Received notification from [employer] that prior authorization for Temodar (Temozolomide) is required.  PA submitted on CoverMyMeds Key [id number]  Status is pending  Oral Oncology Clinic will continue to follow.  [place] Patient [place] Phone [phone] Fax [phone] 10/01/2020 9:40 AM

## 2020-10-05 ENCOUNTER — Emergency Department: Payer: Commercial Managed Care - PPO

## 2020-10-05 ENCOUNTER — Emergency Department
Admission: EM | Admit: 2020-10-05 | Discharge: 2020-10-05 | Disposition: A | Discharge status: Home / Self Care | Payer: Commercial Managed Care - PPO | Attending: Emergency Medicine | Admitting: Emergency Medicine

## 2020-10-05 ENCOUNTER — Other Ambulatory Visit: Payer: Self-pay

## 2020-10-05 DIAGNOSIS — R21 Rash and other nonspecific skin eruption: Secondary | ICD-10-CM | POA: Insufficient documentation

## 2020-10-05 DIAGNOSIS — R300 Dysuria: Secondary | ICD-10-CM | POA: Diagnosis not present

## 2020-10-05 DIAGNOSIS — C719 Malignant neoplasm of brain, unspecified: Secondary | ICD-10-CM

## 2020-10-05 DIAGNOSIS — R059 Cough, unspecified: Secondary | ICD-10-CM | POA: Diagnosis not present

## 2020-10-05 DIAGNOSIS — F17210 Nicotine dependence, cigarettes, uncomplicated: Secondary | ICD-10-CM | POA: Diagnosis not present

## 2020-10-05 DIAGNOSIS — Z7901 Long term (current) use of anticoagulants: Secondary | ICD-10-CM | POA: Diagnosis not present

## 2020-10-05 DIAGNOSIS — Z48811 Encounter for surgical aftercare following surgery on the nervous system: Secondary | ICD-10-CM | POA: Diagnosis not present

## 2020-10-05 DIAGNOSIS — R569 Unspecified convulsions: Secondary | ICD-10-CM

## 2020-10-05 DIAGNOSIS — E876 Hypokalemia: Secondary | ICD-10-CM | POA: Insufficient documentation

## 2020-10-05 DIAGNOSIS — Z20822 Contact with and (suspected) exposure to covid-19: Secondary | ICD-10-CM | POA: Diagnosis not present

## 2020-10-05 DIAGNOSIS — Z9889 Other specified postprocedural states: Secondary | ICD-10-CM

## 2020-10-05 HISTORY — DX: Unspecified convulsions: R56.9

## 2020-10-05 LAB — COMPREHENSIVE METABOLIC PANEL
ALT: 24 U/L (ref 0–44)
AST: 24 U/L (ref 15–41)
Albumin: 3.2 g/dL — ABNORMAL LOW (ref 3.5–5.0)
Alkaline Phosphatase: 55 U/L (ref 38–126)
Anion gap: 10 (ref 5–15)
BUN: 10 mg/dL (ref 6–20)
CO2: 27 mmol/L (ref 22–32)
Calcium: 8.7 mg/dL — ABNORMAL LOW (ref 8.9–10.3)
Chloride: 101 mmol/L (ref 98–111)
Creatinine, Ser: 0.73 mg/dL (ref 0.61–1.24)
GFR, Estimated: >60 mL/min (ref >60)
Glucose, Bld: 93 mg/dL (ref 70–99)
Potassium: 3.3 mmol/L — ABNORMAL LOW (ref 3.5–5.1)
Sodium: 138 mmol/L (ref 135–145)
Total Bilirubin: 0.6 mg/dL (ref 0.3–1.2)
Total Protein: 6.6 g/dL (ref 6.5–8.1)

## 2020-10-05 LAB — CBC WITH DIFFERENTIAL/PLATELET
Abs Immature Granulocytes: 0.09 10*3/uL — ABNORMAL HIGH (ref 0.00–0.07)
Basophils Absolute: 0.1 10*3/uL (ref 0.0–0.1)
Basophils Relative: 1 %
Eosinophils Absolute: 0 10*3/uL (ref 0.0–0.5)
Eosinophils Relative: 0 %
HCT: 34.3 % — ABNORMAL LOW (ref 39.0–52.0)
Hemoglobin: 10.9 g/dL — ABNORMAL LOW (ref 13.0–17.0)
Immature Granulocytes: 1 %
Lymphocytes Relative: 15 %
Lymphs Abs: 1.4 10*3/uL (ref 0.7–4.0)
MCH: 29.7 pg (ref 26.0–34.0)
MCHC: 31.8 g/dL (ref 30.0–36.0)
MCV: 93.5 fL (ref 80.0–100.0)
Monocytes Absolute: 1 10*3/uL (ref 0.1–1.0)
Monocytes Relative: 10 %
Neutro Abs: 6.7 10*3/uL (ref 1.7–7.7)
Neutrophils Relative %: 73 %
Platelets: 376 10*3/uL (ref 150–400)
RBC: 3.67 MIL/uL — ABNORMAL LOW (ref 4.22–5.81)
RDW: 15.6 % — ABNORMAL HIGH (ref 11.5–15.5)
WBC: 9.3 10*3/uL (ref 4.0–10.5)
nRBC: 0 % (ref 0.0–0.2)

## 2020-10-05 LAB — PROTIME-INR
INR: 1.1 (ref 0.8–1.2)
Prothrombin Time: 14 seconds (ref 11.4–15.2)

## 2020-10-05 LAB — RESP PANEL BY RT-PCR (FLU A&B, COVID) ARPGX2
Influenza A by PCR: NEGATIVE
Influenza B by PCR: NEGATIVE
SARS Coronavirus 2 by RT PCR: NEGATIVE

## 2020-10-05 LAB — MAGNESIUM: Magnesium: 1.6 mg/dL — ABNORMAL LOW (ref 1.7–2.4)

## 2020-10-05 MED ORDER — MAGNESIUM SULFATE 2 GM/50ML IV SOLN
2.0000 g | Freq: Once | INTRAVENOUS | Status: AC
  Administered 2020-10-05: 2 g via INTRAVENOUS
  Filled 2020-10-05: qty 50

## 2020-10-05 MED ORDER — LEVETIRACETAM 750 MG PO TABS: 1500.0000 mg | ORAL_TABLET | Freq: Two times a day (BID) | ORAL | 0 refills | Status: DC

## 2020-10-05 MED ORDER — LEVETIRACETAM IN NACL 1500 MG/100ML IV SOLN
1500.0000 mg | Freq: Once | INTRAVENOUS | Status: AC
  Administered 2020-10-05: 1500 mg via INTRAVENOUS
  Filled 2020-10-05: qty 100

## 2020-10-05 MED ORDER — POTASSIUM CHLORIDE CRYS ER 20 MEQ PO TBCR
40.0000 meq | EXTENDED_RELEASE_TABLET | Freq: Once | ORAL | Status: AC
  Administered 2020-10-05: 40 meq via ORAL
  Filled 2020-10-05: qty 2

## 2020-10-05 NOTE — ED Notes (Signed)
Pt assisted to wheelchair, instructions given to wife, no questions or concerns voiced. Taken to car and helped in car via wheelchair and 2 techs. VSS. NAD

## 2020-10-05 NOTE — ED Provider Notes (Signed)
[facility] Emergency Department Provider Note  ____________________________________________   Event Date/Time   First MD Initiated Contact with Patient 10/05/20 1642     (approximate)  I have reviewed the triage vital signs and the nursing notes.   HISTORY  Chief Complaint Loss of Consciousness   HPI Todd Villa is a 50 y.o. male with a past medical history of HTN, DVT on Eliquis status post IVC filter, obesity, CVA with left hemiparesis and glioblastoma status post right craniectomy complicated by seizures on keppra and development of abscess s/p I&D a [facility] 4/7 currently on Cipro who presents for assessment after he had a witnessed seizure at a postop appointment in neurosurgery clinic office earlier today.  He is accompanied by his wife who assist in providing some history.  He states he feels fine and only has had a little cough the last couple days.  He still has some weakness in his left arm and left leg but otherwise has been doing okay.  Denies any headache, vision changes, vertigo, cough, chest pain, shortness of breath, Donnell pain recent vomiting, diarrhea, dysuria, rash or any other acute sick symptoms.  No recent falls.  He has been taking his Keppra and other medicines including Eliquis as directed.         Past Medical History:  Diagnosis Date  . High blood pressure   . Obesity   . Seizures ([place])   . Stroke (cerebrum) ([place]) 06/2020    Patient Active Problem List   Diagnosis Date Noted  . Wound drainage 09/15/2020  . Slow transit constipation   . Sleep disturbance   . Hyponatremia   . New onset seizure ([place])   . Acute deep vein thrombosis (DVT) of popliteal vein of left lower extremity ([place])   . Vascular headache   . Sleep disorder   . Left hemiparesis ([place])   . Glioblastoma multiforme of brain ([place]) 08/27/2020    Past Surgical History:  Procedure Laterality Date  . IR IVC FILTER PLMT / S&I /IMG GUID/MOD SED  08/29/2020   . ROTATOR CUFF REPAIR Left 2015    Prior to Admission medications   Medication Sig Start Date End Date Taking? Authorizing Provider  acetaminophen (TYLENOL) 325 MG tablet Take 2 tablets (650 mg total) by mouth 4 (four) times daily -  with meals and at bedtime. 09/08/20   Love, Ivan Anchors, PA-C  acetaminophen (TYLENOL) 325 MG tablet Take 1,000 mg by mouth. 09/20/20   [provider]  apixaban (ELIQUIS) 5 MG TABS tablet  09/21/20   [provider]  atorvastatin (LIPITOR) 10 MG tablet Take 10 mg by mouth daily.    [provider]  bacitracin 500 UNIT/GM ointment Apply 1 application topically daily. Apply to incision site for 14 days.    [provider]  bacitracin ointment Apply topically 2 (two) times daily. 09/14/20   Love, Ivan Anchors, PA-C  ciprofloxacin (CIPRO) 750 MG tablet Take by mouth. 09/20/20 11/01/20  [provider]  doxycycline (VIBRA-TABS) 100 MG tablet Take 1 tablet (100 mg total) by mouth every 12 (twelve) hours. 09/14/20   Love, Ivan Anchors, PA-C  FLUoxetine (PROZAC) 20 MG capsule Take 20 mg by mouth daily.    [provider]  levETIRAcetam (KEPPRA) 750 MG tablet Take 2 tablets (1,500 mg total) by mouth every 12 (twelve) hours. 10/05/20 11/04/20  Lucrezia Starch, MD  melatonin 3 MG TABS tablet Take 1 tablet (3 mg total) by mouth at bedtime. 09/14/20  Love, Pamela S, PA-C  oxyCODONE (OXY IR/ROXICODONE) 5 MG immediate release tablet Take 5 mg by mouth every 4 (four) hours as needed (for pain not relieved by tylenol).    [provider]  pantoprazole (PROTONIX) 40 MG tablet Take 40 mg by mouth daily.    [provider]  polyethylene glycol (MIRALAX / GLYCOLAX) 17 g packet Take 17 g by mouth daily.    [provider]  senna-docusate (SENOKOT-S) 8.6-50 MG tablet Take 2 tablets by mouth 2 (two) times daily.    [provider]    Allergies Nuedexta [dextromethorphan-quinidine] and Penicillins  Family History   Problem Relation Age of Onset  . Hyperlipidemia Mother   . Heart disease Mother   . Hypertension Mother   . Diabetes Mother   . Alcohol abuse Father   . Heart disease Father   . Hypertension Father   . Diabetes Sister   . Cancer Neg Hx   . Stroke Neg Hx     Social History Social History   Tobacco Use  . Smoking status: Current Every Day Smoker    Packs/day: 0.66    Years: 30.00    Pack years: 19.80    Types: Cigarettes  . Smokeless tobacco: Never Used  Substance Use Topics  . Alcohol use: No    Alcohol/week: 0.0 standard drinks  . Drug use: No    Review of Systems  Review of Systems  Constitutional: Negative for chills and fever.  HENT: Negative for sore throat.   Eyes: Negative for pain.  Respiratory: Positive for cough. Negative for stridor.   Cardiovascular: Negative for chest pain.  Gastrointestinal: Negative for vomiting.  Genitourinary: Negative for dysuria.  Musculoskeletal: Negative for myalgias.  Skin: Negative for rash.  Neurological: Positive for sensory change ( decreased sensation over L arm, L leg), focal weakness ( L arm, L leg) and seizures. Negative for headaches.  Psychiatric/Behavioral: Negative for suicidal ideas.  All other systems reviewed and are negative.     ____________________________________________   PHYSICAL EXAM:  VITAL SIGNS: ED Triage Vitals  Enc Vitals Group     BP      Pulse      Resp      Temp      Temp src      SpO2      Weight      Height      Head Circumference      Peak Flow      Pain Score      Pain Loc      Pain Edu?      Excl. in [place]?    Vitals:   10/05/20 1655  BP: 104/63  Pulse: 78  Resp: 18  Temp: 98.9 F (37.2 C)  SpO2: 95%   Physical Exam Vitals and nursing note reviewed.  Constitutional:      Appearance: He is well-developed. He is obese.  HENT:     Head: Normocephalic and atraumatic.     Right Ear: External ear normal.     Left Ear: External ear normal.     Nose: Nose normal.   Eyes:     Conjunctiva/sclera: Conjunctivae normal.  Cardiovascular:     Rate and Rhythm: Normal rate and regular rhythm.     Heart sounds: No murmur heard.   Pulmonary:     Effort: Pulmonary effort is normal. No respiratory distress.     Breath sounds: Normal breath sounds.  Abdominal:     Palpations: Abdomen is  soft.     Tenderness: There is no abdominal tenderness.  Musculoskeletal:     Cervical back: Neck supple.  Skin:    General: Skin is warm and dry.     Capillary Refill: Capillary refill takes less than 2 seconds.  Neurological:     Mental Status: He is alert and oriented to person, place, and time.  Psychiatric:        Mood and Affect: Mood normal.     Patient is able to move his left arm and left leg somewhat although he is fairly unclear needed and is much weaker in the right arm and right leg.  He also reports a sensation is decreased although intact to light touch in the left arm and leg.  Full strength about the right upper leg.  PERRLA.  EOMI.  Cranial nerves II through XII grossly intact.  Scalp incision appears clean dry and intact without evidence of induration fluctuance active drainage or other evidence of infection.   ____________________________________________   LABS (all labs ordered are listed, but only abnormal results are displayed)  Labs Reviewed  CBC WITH DIFFERENTIAL/PLATELET - Abnormal; Notable for the following components:      Result Value   RBC 3.67 (*)    Hemoglobin 10.9 (*)    HCT 34.3 (*)    RDW 15.6 (*)    Abs Immature Granulocytes 0.09 (*)    All other components within normal limits  COMPREHENSIVE METABOLIC PANEL - Abnormal; Notable for the following components:   Potassium 3.3 (*)    Calcium 8.7 (*)    Albumin 3.2 (*)    All other components within normal limits  MAGNESIUM - Abnormal; Notable for the following components:   Magnesium 1.6 (*)    All other components within normal limits  RESP PANEL BY RT-PCR (FLU A&B, COVID)  ARPGX2  PROTIME-INR   ____________________________________________  EKG  Sinus rhythm with ventricular to 72, normal axis, unremarkable intervals and no clearance of acute ischemia or significant underlying arrhythmia ____________________________________________  RADIOLOGY  ED MD interpretation: Chest x-ray has no focal consolidation, effusion, edema or other clear acute thoracic process.  CT head shows right frontal craniectomy with improving edema and no midline shift.  No evidence of acute intracranial hemorrhage or other clear acute cranial process.  Official radiology report(s): CT Head Wo Contrast  Result Date: 10/05/2020 CLINICAL DATA:  Seizure. History of glioblastoma with multiple craniotomy. Recent craniotomy for infection 10 days ago. EXAM: CT HEAD WITHOUT CONTRAST TECHNIQUE: Contiguous axial images were obtained from the base of the skull through the vertex without intravenous contrast. COMPARISON:  CT head 08/30/2020.  MRI head 08/20/2020 FINDINGS: Brain: Interval right craniectomy. Right frontal parietal fluid collection is slightly smaller now measuring approximately 3.7 x 3.3 x 3.0 cm. Surrounding edema has improved. There is mass-effect on the overlying brain which protrudes through the craniectomy defect. No acute hemorrhage. Ventricle size normal.  No midline shift. Vascular: Negative for hyperdense vessel Skull: Interval right craniectomy. Sinuses/Orbits: Negative Other: None IMPRESSION: Recent right craniectomy for resection. The right frontal parietal fluid collection is mildly improved in the surrounding edema has improved. No midline shift. Electronically Signed   By: Franchot Gallo M.D.   On: 10/05/2020 18:26   DG Chest Portable 1 View  Result Date: 10/05/2020 CLINICAL DATA:  Syncopal episode. EXAM: PORTABLE CHEST 1 VIEW COMPARISON:  May 27, 2013 FINDINGS: The heart size and mediastinal contours are within normal limits. No focal consolidation. No pleural effusion.  No pneumothorax.  Thoracic spondylosis. Surgical clips overlie the left upper quadrant. Surgical anchors in the left humeral head. IMPRESSION: No acute cardiopulmonary disease. Electronically Signed   By: Dahlia Bailiff MD   On: 10/05/2020 17:48    ____________________________________________   PROCEDURES  Procedure(s) performed (including Critical Care):  .1-3 Lead EKG Interpretation Performed by: Lucrezia Starch, MD Authorized by: Lucrezia Starch, MD     Interpretation: normal     ECG rate assessment: normal     Rhythm: sinus rhythm     Ectopy: none     Conduction: normal       ____________________________________________   INITIAL IMPRESSION / ASSESSMENT AND PLAN / ED COURSE      Patient presents with above-stated history exam for assessment after a witnessed seizure at neurosurgery postop visit earlier today at clinic.  This is a resolved without any interventions and reported lasting less than 2 or 3 minutes.  It consisted of generalized shaking per report with some incontinence.  This in the setting of known GBM not currently started on radiation treatment scheduled for 2 weeks from now and patient reporting is compliant with Keppra.  On arrival he is afebrile and hemodynamically stable.  He does have some left arm and leg weakness which seems to be baseline of the last couple weeks.  He denies any other associate symptoms other than some cough last couple days.  Differential includes breakthrough seizures secondary to progression of GBM, recurrence of abscess, metabolic derangements, other acute infectious process.  No history or exam findings to suggest recent trauma.  Low suspicion for toxic ingestion.  CMP shows a K of 3.3 which was repleted.  No other significant metabolic derangements.  Mag is low at 1.6.  This was also repleted.  CBC and INR unremarkable.   Discussed with neurosurgeon Dr. Lacinda Axon who recommended reaching out to neurology to see if patient should be  started on another antiepileptic.  Discussed patient presentation with on-call neurologist Dr. Mike Craze recommended increasing patient's Keppra to 1500 twice daily.  Given otherwise reassuring labs and CT head without other clear acute pathology I think this is reasonable.  Retrorectus cough and cardiology although bronchitis is in differential.  No fever elevated blood cell count or findings on chest x-ray to suggest pneumonia.  No evidence of pneumothorax.  No evidence of acute pulmonary edema.  Suspicion for PE as patient is on Eliquis.  He has no chest pain or EKG findings to suggest ACS.  Given stable vitals otherwise reassuring exam work-up I think he is safe for discharge with outpatient follow-up.  Discharged stable condition.  Strict return precautions advised and discussed. ____________________________________________   FINAL CLINICAL IMPRESSION(S) / ED DIAGNOSES  Final diagnoses:  Seizure ([place])  Hypokalemia  GBM (glioblastoma multiforme) ([facility])  S/P craniotomy  Hypomagnesemia    Medications  potassium chloride SA (KLOR-CON) CR tablet 40 mEq (has no administration in time range)  magnesium sulfate IVPB 2 g 50 mL (has no administration in time range)  levETIRAcetam (KEPPRA) IVPB 1500 mg/ 100 mL premix (1,500 mg Intravenous New Bag/Given 10/05/20 1833)     ED Discharge Orders         Ordered    levETIRAcetam (KEPPRA) 750 MG tablet  Every 12 hours        10/05/20 1904           Note:  This document was prepared using Dragon voice recognition software and may include unintentional dictation errors.   Lucrezia Starch, MD 10/05/20  1959  

## 2020-10-05 NOTE — Discharge Instructions (Addendum)
Your Head CT today showed: Recent right craniectomy for resection. The right frontal parietal fluid collection is mildly improved in the surrounding edema has improved. No midline shift.  Your CXR today showed:  IMPRESSION: No acute cardiopulmonary disease.

## 2020-10-05 NOTE — ED Triage Notes (Signed)
Pt comes from [facility] with c/o syncopal episode while getting sutures removed from site on head. Wife at bedside reports pt had 2 seizures while at the doctors.  Wife reports shaking. Pt takes Keppra for seizures. Pt denies any current complaints.

## 2020-10-11 ENCOUNTER — Other Ambulatory Visit: Payer: Self-pay | Admitting: Internal Medicine

## 2020-10-12 ENCOUNTER — Other Ambulatory Visit: Payer: Self-pay

## 2020-10-12 ENCOUNTER — Other Ambulatory Visit: Payer: Self-pay | Admitting: Internal Medicine

## 2020-10-12 ENCOUNTER — Ambulatory Visit
Admission: RE | Admit: 2020-10-12 | Discharge: 2020-10-12 | Disposition: A | Discharge status: Home / Self Care | Payer: Commercial Managed Care - PPO | Source: Ambulatory Visit | Attending: Internal Medicine | Admitting: Internal Medicine

## 2020-10-12 DIAGNOSIS — C719 Malignant neoplasm of brain, unspecified: Secondary | ICD-10-CM | POA: Insufficient documentation

## 2020-10-12 MED ORDER — GADOBUTROL 1 MMOL/ML IV SOLN
10.0000 mL | Freq: Once | INTRAVENOUS | Status: AC | PRN
  Administered 2020-10-12: 10 mL via INTRAVENOUS

## 2020-10-12 MED ORDER — TEMOZOLOMIDE 100 MG PO CAPS
75.0000 mg/m2/d | ORAL_CAPSULE | Freq: Every day | ORAL | 0 refills | Status: DC
  Filled 2020-10-12: qty 85, 42d supply, fill #0

## 2020-10-12 MED ORDER — ONDANSETRON HCL 8 MG PO TABS
8.0000 mg | ORAL_TABLET | Freq: Two times a day (BID) | ORAL | 1 refills | Status: DC | PRN
  Filled 2020-10-12: qty 30, 15d supply, fill #0

## 2020-10-12 NOTE — Progress Notes (Signed)
START ON PATHWAY REGIMEN - Neuro     One cycle, concurrent with RT:     Temozolomide   **Always confirm dose/schedule in your pharmacy ordering system**  Patient Characteristics: Glioblastoma (Grade 4 Glioma), Newly Diagnosed / Treatment Naive, Good Performance Status and/or Younger Patient, MGMT Promoter Unmethylated/Unknown Disease Classification: Glioma Disease Classification: Glioblastoma (Grade 4 Glioma) Disease Status: Newly Diagnosed / Treatment Naive Performance Status: Good Performance Status and/or Younger Patient MGMT Promoter Methylation Status: Awaiting Test Results Intent of Therapy: Non-Curative / Palliative Intent, Discussed with Patient

## 2020-10-13 ENCOUNTER — Other Ambulatory Visit (HOSPITAL_COMMUNITY): Payer: Self-pay

## 2020-10-13 MED ORDER — TEMOZOLOMIDE 100 MG PO CAPS: 75.0000 mg/m2/d | ORAL_CAPSULE | Freq: Every day | ORAL | 0 refills | Status: DC

## 2020-10-13 NOTE — Telephone Encounter (Signed)
Oral Chemotherapy Pharmacist Encounter  Due to insurance restriction the medication could not be filled at [facility]. Prescription has been e-scribed to [facility] [employer]  Supportive information was faxed to [facility]. We will continue to follow medication access.   Called Mrs. Venuto to let her know about [employer]. I will see the patient on for Temodar education folowing his CT sim appt on 10/18/20.  Darl Pikes, PharmD, BCPS, [facility] Hematology/Oncology Clinical Pharmacist [facility]/HP/AP [facility] [phone]  10/13/2020 9:57 AM

## 2020-10-14 ENCOUNTER — Ambulatory Visit: Payer: Commercial Managed Care - PPO

## 2020-10-14 NOTE — Telephone Encounter (Signed)
Per email from [employer] rep, generic Temodar copay is $6.00.  They will be reaching out to the patient to schedule delivery.  [facility] Phone [phone] Fax [phone] 10/14/2020 10:59 AM

## 2020-10-15 ENCOUNTER — Inpatient Hospital Stay: Payer: Commercial Managed Care - PPO | Attending: Internal Medicine | Admitting: Internal Medicine

## 2020-10-15 ENCOUNTER — Ambulatory Visit: Payer: Commercial Managed Care - PPO | Admitting: Internal Medicine

## 2020-10-15 ENCOUNTER — Other Ambulatory Visit: Payer: Self-pay

## 2020-10-15 DIAGNOSIS — C712 Malignant neoplasm of temporal lobe: Secondary | ICD-10-CM | POA: Insufficient documentation

## 2020-10-15 DIAGNOSIS — R569 Unspecified convulsions: Secondary | ICD-10-CM

## 2020-10-15 DIAGNOSIS — C719 Malignant neoplasm of brain, unspecified: Secondary | ICD-10-CM

## 2020-10-15 NOTE — Progress Notes (Signed)
I connected with Kristin Bruins on 10/15/20 at  9:00 AM EDT by telephone visit and verified that I am speaking with the correct person using two identifiers.  I discussed the limitations, risks, security and privacy concerns of performing an evaluation and management service by telemedicine and the availability of in-person appointments. I also discussed with the patient that there may be a patient responsible charge related to this service. The patient expressed understanding and agreed to proceed.  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  He thinks his left leg weakness has improved mildly, but overall he is still very weak and has no meaningful use of his left side.  Radiation sim is scheduled for Monday, he also has an infectious disease appointment today to address antibiotic regimen for post-surgical soft tissue infection.  He also recently completed an MRI brain, which he tolerated well.  Observations: Language and cognition at baseline  Imaging:  [place] Clinician Interpretation: I have personally reviewed the CNS images as listed.  My interpretation, in the context of the patient's clinical presentation, is progressive disease  CT Head Wo Contrast  Result Date: 10/05/2020 CLINICAL DATA:  Seizure. History of glioblastoma with multiple craniotomy. Recent craniotomy for infection 10 days ago. EXAM: CT HEAD WITHOUT CONTRAST TECHNIQUE: Contiguous axial images were obtained from the base of the skull through the vertex without intravenous contrast. COMPARISON:  CT head 08/30/2020.  MRI head 08/20/2020 FINDINGS: Brain: Interval right  craniectomy. Right frontal parietal fluid collection is slightly smaller now measuring approximately 3.7 x 3.3 x 3.0 cm. Surrounding edema has improved. There is mass-effect on the overlying brain which protrudes through the craniectomy defect. No acute hemorrhage. Ventricle size normal.  No midline shift. Vascular: Negative for hyperdense vessel Skull: Interval right craniectomy. Sinuses/Orbits: Negative Other: None IMPRESSION: Recent right craniectomy for resection. The right frontal parietal fluid collection is mildly improved in the surrounding edema has improved. No midline shift. Electronically Signed   By: Franchot Gallo M.D.   On: 10/05/2020 18:26   MR BRAIN W WO CONTRAST  Result Date: 10/13/2020 CLINICAL DATA:  Brain/CNS neoplasm, assess treatment response. EXAM: MRI HEAD WITHOUT AND WITH CONTRAST TECHNIQUE: Multiplanar, multiecho pulse sequences of the brain and surrounding structures were obtained without and with intravenous contrast. CONTRAST:  63mL GADAVIST GADOBUTROL 1 MMOL/ML IV SOLN COMPARISON:  None. FINDINGS: Brain: Per chart review, the patient has undergone craniectomy and I&D for abscess on 04/07. in comparison to outside prior from August 20, 2020, improved extra-axial fluid collection with residual peripherally enhancing 4.0 x 1.5 cm fluid collection along the craniectomy defect. Herniation of brain parenchyma through craniectomy defect with developing encephalomalacia posteriorly likely secondary to interval infarct. Surrounding the resection cavity there is marked improvement in edema and mass effect with resolution of leftward midline shift. Multiple new areas of nodular enhancement surrounding the resection cavity. For example, there is nodular enhancement along the deep and superior aspect of the resection cavity (series 18, image 124), along the anterior/superior aspect of the resection cavity (series 18, image 117), and along the inferior posterior aspect resection cavity (series 18,  image 9). Additional more linear enhancement along the inferior  aspect of the resection cavity (series 18, image 88) is not substantially changed. New nodular area of restricted diffusion in the inferior aspect of the resection cavity (series 5, image 26) may tumor. No hydrocephalus. Susceptibility artifact involving the craniectomy and resection site likely represents prior hemorrhage. No acute mass occupying hemorrhage. Vascular: Major arterial flow voids are maintained at the skull base. Skull and upper cervical spine: Normal marrow signal. Sinuses/Orbits: Extensive mucosal thickening of bilateral maxillary sinuses, left greater than right ethmoid and sphenoid sinuses. Other: Right greater than left mastoid effusions. IMPRESSION: 1. Per chart review, the patient has undergone craniectomy and I&D for abscess on 04/07. In comparison to outside prior from August 20, 2020, improved extra-axial fluid collection with residual sterility indeterminate peripherally enhancing 4.0 x 1.5 cm fluid collection along the craniectomy defect. Herniation of brain parenchyma through craniectomy defect with developing encephalomalacia posteriorly likely secondary to interval infarct. 2. Surrounding the resection cavity there is marked improvement in edema and mass effect with resolution of leftward midline shift. 3. Multiple new areas of nodular enhancement surrounding the resection cavity are described above and concerning for progression of tumor. 4. Paranasal sinus mucosal thickening and bilateral mastoid effusions. Electronically Signed   By: Margaretha Sheffield MD   On: 10/13/2020 16:57   DG Chest Portable 1 View  Result Date: 10/05/2020 CLINICAL DATA:  Syncopal episode. EXAM: PORTABLE CHEST 1 VIEW COMPARISON:  May 27, 2013 FINDINGS: The heart size and mediastinal contours are within normal limits. No focal consolidation. No pleural effusion. No pneumothorax. Thoracic spondylosis. Surgical clips overlie the left upper  quadrant. Surgical anchors in the left humeral head. IMPRESSION: No acute cardiopulmonary disease. 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  Still with dense motor deficits, awaiting visit from home PT services.  MRI demonstrates recurrence of tumor in nodular pattern adjacent to resection cavity.  This pattern of recurrence is not unexpected and is unlikely to amend radiation treatment plan significantly.    We ultimately recommended proceeding with course of intensity modulated radiation therapy and concurrent daily Temozolomide.  Radiation will be administered Mon-Fri over 6 weeks, Temodar will be dosed at 75mg /m2 to be given daily over 42 days.  We reviewed side effects of temodar, including fatigue, nausea/vomiting, constipation, and cytopenias.  Informed consent was verbally obtained at bedside to proceed with oral chemotherapy.  Chemotherapy should be held for the following:  ANC less than 1,000  Platelets less than 100,000  LFT or creatinine greater than 2x ULN  If clinical concerns/contraindications develop  Keppra should continue at 1500mg  BID.  Antibiotics (currently oral ciprofloxacin) per infectious diseases.  Every 2 weeks during radiation, labs will be checked accompanied by a clinical evaluation in the brain tumor clinic.  I discussed the assessment and treatment plan with the patient.  The patient was provided an opportunity to ask questions and all were answered.  The patient agreed with the plan and demonstrated understanding of the instructions.    The patient was advised to call back or seek an in-person evaluation if the symptoms worsen or if the condition fails to improve as anticipated.  I provided 5-10 minutes of non-face-to-face time during this enocunter.  Ventura Sellers, MD   I provided 25  minutes of non face-to-face telephone visit time during this encounter, and > 50% was spent counseling as  documented under my assessment & plan.

## 2020-10-18 ENCOUNTER — Other Ambulatory Visit: Payer: Self-pay | Admitting: Licensed Clinical Social Worker

## 2020-10-18 ENCOUNTER — Ambulatory Visit
Admission: RE | Admit: 2020-10-18 | Discharge: 2020-10-18 | Disposition: A | Discharge status: Home / Self Care | Payer: Commercial Managed Care - PPO | Source: Ambulatory Visit | Attending: Radiation Oncology | Admitting: Radiation Oncology

## 2020-10-18 ENCOUNTER — Inpatient Hospital Stay: Payer: Commercial Managed Care - PPO | Admitting: Pharmacist

## 2020-10-18 ENCOUNTER — Other Ambulatory Visit (HOSPITAL_COMMUNITY): Payer: Self-pay

## 2020-10-18 DIAGNOSIS — C712 Malignant neoplasm of temporal lobe: Secondary | ICD-10-CM | POA: Diagnosis not present

## 2020-10-18 DIAGNOSIS — Z510 Encounter for antineoplastic radiation therapy: Secondary | ICD-10-CM | POA: Diagnosis present

## 2020-10-18 DIAGNOSIS — C719 Malignant neoplasm of brain, unspecified: Secondary | ICD-10-CM

## 2020-10-18 MED ORDER — LANSOPRAZOLE 30 MG PO CPDR: 30.0000 mg | DELAYED_RELEASE_CAPSULE | Freq: Every day | ORAL | 1 refills | Status: DC

## 2020-10-18 MED ORDER — ONDANSETRON HCL 8 MG PO TABS: 8.0000 mg | ORAL_TABLET | Freq: Two times a day (BID) | ORAL | 1 refills | Status: DC | PRN

## 2020-10-18 MED ORDER — DEXAMETHASONE 4 MG PO TABS: 4.0000 mg | ORAL_TABLET | Freq: Every day | ORAL | 1 refills | Status: DC

## 2020-10-18 NOTE — Progress Notes (Signed)
[facility]  Telephone:([phone] [phone]  Patient Care Team: Cleophas Dunker as PCP - General (Family Medicine)   Name of the patient: Todd Villa  [id number]  02-25-1971   Date of visit: 10/18/20  HPI: Patient is a 50 y.o. male with glioblastoma multiforme. Planned treatment of Temodar (temozolomide) with radiation. His first treatment is scheduled for 5/17.  Reason for Consult: Temodar (temozolomide) oral chemotherapy education.   PAST MEDICAL HISTORY: Past Medical History:  Diagnosis Date  . High blood pressure   . Obesity   . Seizures ([place])   . 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 Lacinda Axon; path demonstrates Glioblastoma IDH-1 wild type   09/16/2020 Surgery   Wound flap washout with Dr. Lacinda Axon; collection speciates proteus.  PICC line placed for cefepime therapy.   10/12/2020 -  Chemotherapy    Patient is on Treatment Plan: BRAIN GLIOBLASTOMA RADIATION THERAPY WITH CONCURRENT TEMOZOLOMIDE 75 MG/M2 DAILY FOLLOWED BY SEQUENTIAL MAINTENANCE TEMOZOLOMIDE X 6-12 CYCLES        ALLERGIES:  is allergic to nuedexta [dextromethorphan-quinidine] and penicillins.  MEDICATIONS:  Current Outpatient Medications  Medication Sig Dispense Refill  . acetaminophen (TYLENOL) 325 MG tablet Take 2 tablets (650 mg total) by mouth 4 (four) times daily -  with meals and at bedtime.    Mar[place] Kitchen acetaminophen (TYLENOL) 325 MG tablet Take 1,000 mg by mouth.    Mar[place] Kitchen apixaban (ELIQUIS) 5 MG TABS tablet     . atorvastatin (LIPITOR) 10 MG tablet Take 10 mg by mouth daily.    . bacitracin 500 UNIT/GM ointment Apply 1 application topically daily. Apply to incision site for 14 days.    . bacitracin ointment Apply topically 2 (two) times daily. 120 g 0  . ciprofloxacin (CIPRO)  750 MG tablet Take by mouth.    . doxycycline (VIBRA-TABS) 100 MG tablet Take 1 tablet (100 mg total) by mouth every 12 (twelve) hours.    Marland Kitchen FLUoxetine (PROZAC) 20 MG capsule Take 20 mg by mouth daily.    Marland Kitchen levETIRAcetam (KEPPRA) 750 MG tablet Take 2 tablets (1,500 mg total) by mouth every 12 (twelve) hours. 120 tablet 0  . melatonin 3 MG TABS tablet Take 1 tablet (3 mg total) by mouth at bedtime.  0  . ondansetron (ZOFRAN) 8 MG tablet Take 1 tablet (8 mg total) by mouth 2 (two) times daily as needed (nausea and vomiting). May take 30-60 minutes prior to Temodar administration if nausea/vomiting occurs. 30 tablet 1  . oxyCODONE (OXY IR/ROXICODONE) 5 MG immediate release tablet Take 5 mg by mouth every 4 (four) hours as needed (for pain not relieved by tylenol).    . pantoprazole (PROTONIX) 40 MG tablet Take 40 mg by mouth daily.    . polyethylene glycol (MIRALAX / GLYCOLAX) 17 g packet Take 17 g by mouth daily.    Marland Kitchen senna-docusate (SENOKOT-S) 8.6-50 MG tablet Take 2 tablets by mouth 2 (two) times daily.    Marland Kitchen temozolomide (TEMODAR) 100 MG capsule Take 2 capsules (200 mg total) by mouth daily. May take on an empty stomach to decrease nausea & vomiting. 84 capsule 0   No current facility-administered medications for this visit.    VITAL SIGNS: There were no vitals taken for this visit. There were no vitals filed for this visit.  Estimated body mass index  is 40.64 kg/m as calculated from the following:   Height as of 08/27/20: 6' 3"  (1.905 m).   Weight as of 08/27/20: 147.5 kg (325 lb 2.9 oz).  LABS: CBC:    Component Value Date/Time   WBC 9.3 10/05/2020 1804   HGB 10.9 (L) 10/05/2020 1804   HGB 14.4 07/14/2013 1031   HCT 34.3 (L) 10/05/2020 1804   HCT 43.1 07/14/2013 1031   PLT 376 10/05/2020 1804   PLT 337 07/14/2013 1031   MCV 93.5 10/05/2020 1804   MCV 93 07/14/2013 1031   NEUTROABS 6.7 10/05/2020 1804   LYMPHSABS 1.4 10/05/2020 1804   MONOABS 1.0 10/05/2020 1804   EOSABS 0.0  10/05/2020 1804   BASOSABS 0.1 10/05/2020 1804   Comprehensive Metabolic Panel:    Component Value Date/Time   NA 138 10/05/2020 1804   NA 137 07/14/2013 1031   K 3.3 (L) 10/05/2020 1804   K 3.8 07/14/2013 1031   CL 101 10/05/2020 1804   CL 107 07/14/2013 1031   CO2 27 10/05/2020 1804   CO2 25 07/14/2013 1031   BUN 10 10/05/2020 1804   BUN 9 07/14/2013 1031   CREATININE 0.73 10/05/2020 1804   CREATININE 0.98 07/14/2013 1031   GLUCOSE 93 10/05/2020 1804   GLUCOSE 125 (H) 07/14/2013 1031   CALCIUM 8.7 (L) 10/05/2020 1804   CALCIUM 8.7 07/14/2013 1031   AST 24 10/05/2020 1804   ALT 24 10/05/2020 1804   ALKPHOS 55 10/05/2020 1804   BILITOT 0.6 10/05/2020 1804   PROT 6.6 10/05/2020 1804   ALBUMIN 3.2 (L) 10/05/2020 1804     Present during today's visit: Mr. Elgersma and his sister   Assessment and Plan: Start plan: Patient will start temozolomide along with his first radiation treatment on 5/17.   Patient Education I spoke with patient for overview of new oral chemotherapy medication: Temodar (temozolomide) for the treatment of right temporal GBM in conjunction with XRT, planned duration until the end of radiation.   Administration: Counseled patient on administration, dosing, side effects, monitoring, drug-food interactions, safe handling, storage, and disposal. Patient will take 2 capsules (200 mg total) by mouth daily. May take on an empty stomach to decrease nausea & vomiting.  Side Effects: Side effects include but not limited to: nausea, hair loss, decreased wbc, fatigue, constipation.    Ondansetron prescription redirected to his local pharmacy.  Adherence: After discussion with patient, no patient barriers to medication adherence identified.  Reviewed with patient importance of keeping a medication schedule and plan for any missed doses.  Mr. Montfort voiced understanding and appreciation. All questions answered. Medication handout provided.  Provided patient  with [facility] phone number. Patient knows to call the office with questions or concerns. Oral Chemotherapy Navigation Clinic will continue to follow.  Patient expressed understanding and was in agreement with this plan. He also understands that He can call clinic at any time with any questions, concerns, or complaints.   Medication Access Issues: Patient required to fill at [facility]. Temozolomide has been delivered, Copay $6.00.  Thank you for allowing me to participate in the care of this patient.   Time Total: 20 mins  Visit consisted of counseling and education on dealing with issues of symptom management in the setting of serious and potentially life-threatening illness.Greater than 50%  of this time was spent counseling and coordinating care related to the above assessment and plan.  Signed by: Darl Pikes, PharmD, BCPS, BCOP, CPP Hematology/Oncology Clinical Pharmacist  Practitioner [facility]/HP/AP Oral Chemotherapy Navigation [facility] [phone]  10/18/2020 9:54 AM

## 2020-10-20 DIAGNOSIS — C712 Malignant neoplasm of temporal lobe: Secondary | ICD-10-CM | POA: Diagnosis not present

## 2020-10-22 ENCOUNTER — Ambulatory Visit: Payer: Commercial Managed Care - PPO | Admitting: Internal Medicine

## 2020-10-25 ENCOUNTER — Ambulatory Visit: Admission: RE | Admit: 2020-10-25 | Payer: Commercial Managed Care - PPO | Source: Ambulatory Visit

## 2020-10-26 ENCOUNTER — Ambulatory Visit
Admission: RE | Admit: 2020-10-26 | Discharge: 2020-10-26 | Disposition: A | Discharge status: Home / Self Care | Payer: Commercial Managed Care - PPO | Source: Ambulatory Visit | Attending: Radiation Oncology | Admitting: Radiation Oncology

## 2020-10-26 DIAGNOSIS — C712 Malignant neoplasm of temporal lobe: Secondary | ICD-10-CM | POA: Diagnosis not present

## 2020-10-27 ENCOUNTER — Ambulatory Visit
Admission: RE | Admit: 2020-10-27 | Discharge: 2020-10-27 | Disposition: A | Discharge status: Home / Self Care | Payer: Commercial Managed Care - PPO | Source: Ambulatory Visit | Attending: Radiation Oncology | Admitting: Radiation Oncology

## 2020-10-27 DIAGNOSIS — C712 Malignant neoplasm of temporal lobe: Secondary | ICD-10-CM | POA: Diagnosis not present

## 2020-10-28 ENCOUNTER — Ambulatory Visit
Admission: RE | Admit: 2020-10-28 | Discharge: 2020-10-28 | Disposition: A | Discharge status: Home / Self Care | Payer: Commercial Managed Care - PPO | Source: Ambulatory Visit | Attending: Radiation Oncology | Admitting: Radiation Oncology

## 2020-10-28 DIAGNOSIS — C712 Malignant neoplasm of temporal lobe: Secondary | ICD-10-CM | POA: Diagnosis not present

## 2020-10-29 ENCOUNTER — Ambulatory Visit
Admission: RE | Admit: 2020-10-29 | Discharge: 2020-10-29 | Disposition: A | Discharge status: Home / Self Care | Payer: Commercial Managed Care - PPO | Source: Ambulatory Visit | Attending: Radiation Oncology | Admitting: Radiation Oncology

## 2020-10-29 DIAGNOSIS — C712 Malignant neoplasm of temporal lobe: Secondary | ICD-10-CM | POA: Diagnosis not present

## 2020-11-01 ENCOUNTER — Ambulatory Visit
Admission: RE | Admit: 2020-11-01 | Discharge: 2020-11-01 | Disposition: A | Discharge status: Home / Self Care | Payer: Commercial Managed Care - PPO | Source: Ambulatory Visit | Attending: Radiation Oncology | Admitting: Radiation Oncology

## 2020-11-01 DIAGNOSIS — C712 Malignant neoplasm of temporal lobe: Secondary | ICD-10-CM | POA: Diagnosis not present

## 2020-11-02 ENCOUNTER — Ambulatory Visit
Admission: RE | Admit: 2020-11-02 | Discharge: 2020-11-02 | Disposition: A | Discharge status: Home / Self Care | Payer: Commercial Managed Care - PPO | Source: Ambulatory Visit | Attending: Radiation Oncology | Admitting: Radiation Oncology

## 2020-11-02 DIAGNOSIS — C712 Malignant neoplasm of temporal lobe: Secondary | ICD-10-CM | POA: Diagnosis not present

## 2020-11-03 ENCOUNTER — Ambulatory Visit
Admission: RE | Admit: 2020-11-03 | Discharge: 2020-11-03 | Disposition: A | Discharge status: Home / Self Care | Payer: Commercial Managed Care - PPO | Source: Ambulatory Visit | Attending: Radiation Oncology | Admitting: Radiation Oncology

## 2020-11-03 DIAGNOSIS — C712 Malignant neoplasm of temporal lobe: Secondary | ICD-10-CM | POA: Diagnosis not present

## 2020-11-04 ENCOUNTER — Ambulatory Visit
Admission: RE | Admit: 2020-11-04 | Discharge: 2020-11-04 | Disposition: A | Discharge status: Home / Self Care | Payer: Commercial Managed Care - PPO | Source: Ambulatory Visit | Attending: Radiation Oncology | Admitting: Radiation Oncology

## 2020-11-04 DIAGNOSIS — C712 Malignant neoplasm of temporal lobe: Secondary | ICD-10-CM | POA: Diagnosis not present

## 2020-11-05 ENCOUNTER — Inpatient Hospital Stay: Payer: Commercial Managed Care - PPO

## 2020-11-05 ENCOUNTER — Encounter: Payer: Self-pay | Admitting: Internal Medicine

## 2020-11-05 ENCOUNTER — Ambulatory Visit
Admission: RE | Admit: 2020-11-05 | Discharge: 2020-11-05 | Disposition: A | Discharge status: Home / Self Care | Payer: Commercial Managed Care - PPO | Source: Ambulatory Visit | Attending: Radiation Oncology | Admitting: Radiation Oncology

## 2020-11-05 ENCOUNTER — Inpatient Hospital Stay (HOSPITAL_BASED_OUTPATIENT_CLINIC_OR_DEPARTMENT_OTHER): Payer: Commercial Managed Care - PPO | Admitting: Internal Medicine

## 2020-11-05 ENCOUNTER — Other Ambulatory Visit: Payer: Self-pay

## 2020-11-05 VITALS — BP 113/76 | HR 84 | Resp 18 | Ht 75.0 in | Wt 300.6 lb

## 2020-11-05 DIAGNOSIS — C719 Malignant neoplasm of brain, unspecified: Secondary | ICD-10-CM | POA: Diagnosis not present

## 2020-11-05 DIAGNOSIS — C712 Malignant neoplasm of temporal lobe: Secondary | ICD-10-CM | POA: Diagnosis not present

## 2020-11-05 LAB — CBC WITH DIFFERENTIAL/PLATELET
Abs Immature Granulocytes: 0.25 10*3/uL — ABNORMAL HIGH (ref 0.00–0.07)
Basophils Absolute: 0.1 10*3/uL (ref 0.0–0.1)
Basophils Relative: 0 %
Eosinophils Absolute: 0.1 10*3/uL (ref 0.0–0.5)
Eosinophils Relative: 0 %
HCT: 41.9 % (ref 39.0–52.0)
Hemoglobin: 13.5 g/dL (ref 13.0–17.0)
Immature Granulocytes: 1 %
Lymphocytes Relative: 18 %
Lymphs Abs: 4.3 10*3/uL — ABNORMAL HIGH (ref 0.7–4.0)
MCH: 29.6 pg (ref 26.0–34.0)
MCHC: 32.2 g/dL (ref 30.0–36.0)
MCV: 91.9 fL (ref 80.0–100.0)
Monocytes Absolute: 2.3 10*3/uL — ABNORMAL HIGH (ref 0.1–1.0)
Monocytes Relative: 9 %
Neutro Abs: 17.7 10*3/uL — ABNORMAL HIGH (ref 1.7–7.7)
Neutrophils Relative %: 72 %
Platelets: 344 10*3/uL (ref 150–400)
RBC: 4.56 MIL/uL (ref 4.22–5.81)
RDW: 15.3 % (ref 11.5–15.5)
WBC: 24.7 10*3/uL — ABNORMAL HIGH (ref 4.0–10.5)
nRBC: 0 % (ref 0.0–0.2)

## 2020-11-05 LAB — COMPREHENSIVE METABOLIC PANEL
ALT: 24 U/L (ref 0–44)
AST: 16 U/L (ref 15–41)
Albumin: 3.5 g/dL (ref 3.5–5.0)
Alkaline Phosphatase: 40 U/L (ref 38–126)
Anion gap: 11 (ref 5–15)
BUN: 20 mg/dL (ref 6–20)
CO2: 26 mmol/L (ref 22–32)
Calcium: 8.7 mg/dL — ABNORMAL LOW (ref 8.9–10.3)
Chloride: 102 mmol/L (ref 98–111)
Creatinine, Ser: 0.69 mg/dL (ref 0.61–1.24)
GFR, Estimated: >60 mL/min (ref >60)
Glucose, Bld: 93 mg/dL (ref 70–99)
Potassium: 3.4 mmol/L — ABNORMAL LOW (ref 3.5–5.1)
Sodium: 139 mmol/L (ref 135–145)
Total Bilirubin: 0.5 mg/dL (ref 0.3–1.2)
Total Protein: 6.2 g/dL — ABNORMAL LOW (ref 6.5–8.1)

## 2020-11-05 MED ORDER — DEXAMETHASONE 1 MG PO TABS: ORAL_TABLET | ORAL | 0 refills | Status: DC

## 2020-11-05 NOTE — Progress Notes (Signed)
[facility] [address] [place], [place] [address] [phone]   Interval Evaluation  Date of Service: 11/05/20 Patient Name: Todd Villa Patient MRN: [id number] Patient DOB: March 27, 1971 Provider: Ventura Sellers, MD  Identifying Statement:  Todd Villa is a 50 y.o. male with right temporal glioblastoma   Oncologic History: Oncology History  Glioblastoma multiforme of brain [facility])  08/19/2020 Surgery   Craniotomy, resection of right temporal mass with Dr. Lacinda Axon; path demonstrates Glioblastoma IDH-1 wild type   09/16/2020 Surgery   Wound flap washout with Dr. Lacinda Axon; collection speciates proteus.  PICC line placed for cefepime therapy.   10/12/2020 -  Chemotherapy    Patient is on Treatment Plan: BRAIN GLIOBLASTOMA RADIATION THERAPY WITH CONCURRENT TEMOZOLOMIDE 75 MG/M2 DAILY FOLLOWED BY SEQUENTIAL MAINTENANCE TEMOZOLOMIDE X 6-12 CYCLES        Biomarkers:  MGMT Unknown.  IDH 1/2 Wild type.  EGFR Unknown  TERT Unknown   Interval History:  Todd Villa presents today for follow up, now having completed 2 weeks of radiation and Temozolomide.  He describes overall stability of left sided weakness, still with no meaningful use of the left hand.  He continues to ambulate around the home with his walker, or sometimes by holding onto objects.  He does describe increase in fatigue symptoms in recent days.  Despite that, he continues to work part time, still using phone and laptop for that at home.  Continues to dose decadron 24m daily.   H+P (09/24/20) Patient presents to clinic today for clinical review and treatment planning following repeat craniotomy last week.  During rehabilitation stay he developed soft tissue infection, [facility] performed [facility]aniotomy and washout.  He was initially placed on IV antibiotics (cefepime), but was transitioned to ciprofloxacin upon discharge, which he is  taking currently.  He continues to complain of dense left sided weakness, modestly improved from prior.  He has been back working (from home) mostly using telephone and computer.  Does complain of mild fatigue and has been napping daily.      Medications: Current Outpatient Medications on File Prior to Visit  Medication Sig Dispense Refill  . apixaban (ELIQUIS) 5 MG TABS tablet     . atorvastatin (LIPITOR) 10 MG tablet Take 10 mg by mouth daily.    .Marland Kitchendexamethasone (DECADRON) 4 MG tablet Take 1 tablet (4 mg total) by mouth daily. 30 tablet 1  . FLUoxetine (PROZAC) 20 MG capsule Take 20 mg by mouth daily.    . lansoprazole (PREVACID) 30 MG capsule Take 1 capsule (30 mg total) by mouth daily at 12 noon. 30 capsule 1  . ondansetron (ZOFRAN) 8 MG tablet Take 1 tablet (8 mg total) by mouth 2 (two) times daily as needed (nausea and vomiting). May take 30-60 minutes prior to Temodar administration if nausea/vomiting occurs. 30 tablet 1  . pantoprazole (PROTONIX) 40 MG tablet Take 40 mg by mouth daily.    .Marland Kitchentemozolomide (TEMODAR) 100 MG capsule Take 2 capsules (200 mg total) by mouth daily. May take on an empty stomach to decrease nausea & vomiting. 84 capsule 0  . acetaminophen (TYLENOL) 325 MG tablet Take 2 tablets (650 mg total) by mouth 4 (four) times daily -  with meals and at bedtime.    .Marland Kitchenacetaminophen (TYLENOL) 325 MG tablet Take 1,000 mg by mouth.    . bacitracin 500 UNIT/GM ointment Apply 1 application topically daily.  Apply to incision site for 14 days.    . bacitracin ointment Apply topically 2 (two) times daily. 120 g 0  . doxycycline (VIBRA-TABS) 100 MG tablet Take 1 tablet (100 mg total) by mouth every 12 (twelve) hours.    . levETIRAcetam (KEPPRA) 750 MG tablet Take 2 tablets (1,500 mg total) by mouth every 12 (twelve) hours. 120 tablet 0  . melatonin 3 MG TABS tablet Take 1 tablet (3 mg total) by mouth at bedtime.  0  . oxyCODONE (OXY IR/ROXICODONE) 5 MG immediate release tablet Take 5 mg  by mouth every 4 (four) hours as needed (for pain not relieved by tylenol).    . polyethylene glycol (MIRALAX / GLYCOLAX) 17 g packet Take 17 g by mouth daily.    Marland Kitchen senna-docusate (SENOKOT-S) 8.6-50 MG tablet Take 2 tablets by mouth 2 (two) times daily.     No current facility-administered medications on file prior to visit.    Allergies:  Allergies  Allergen Reactions  . Nuedexta [Dextromethorphan-Quinidine]     Psychosis   . Penicillins Other (See Comments)    Childhood reaction - patient is unsure of what happened.   Past Medical History:  Past Medical History:  Diagnosis Date  . High blood pressure   . Obesity   . Seizures ([place])   . 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 IR IVC FILTER PLMT / S&I /IMG GUID/MOD SED  08/29/2020  . ROTATOR CUFF REPAIR Left 2015   Social History:  Social History   Socioeconomic History  . Marital status: Married    Spouse name: Not on file  . Number of children: Not on file  . Years of education: Not on file  . Highest education level: Not on file  Occupational History  . Not on file  Tobacco Use  . Smoking status: Current Every Day Smoker    Packs/day: 0.66    Years: 30.00    Pack years: 19.80    Types: Cigarettes  . Smokeless tobacco: Never Used  Substance and Sexual Activity  . Alcohol use: No    Alcohol/week: 0.0 standard drinks  . Drug use: No  . Sexual activity: Yes  Other Topics Concern  . Not on file  Social History Narrative  . Not on file   Social Determinants of Health   Financial Resource Strain: Not on file  Food Insecurity: Not on file  Transportation Needs: Not on file  Physical Activity: Not on file  Stress: Not on file  Social Connections: Not on file  Intimate Partner Violence: Not on file   Family History:  Family History  Problem Relation Age of Onset  . Hyperlipidemia Mother   . Heart disease Mother   . Hypertension  Mother   . Diabetes Mother   . Alcohol abuse Father   . Heart disease Father   . Hypertension Father   . Diabetes Sister   . Cancer Neg Hx   . Stroke Neg Hx     Review of Systems: Constitutional: Doesn't report fevers, chills or abnormal weight loss Eyes: Doesn't report blurriness of vision Ears, nose, mouth, throat, and face: Doesn't report sore throat Respiratory: Doesn't report cough, dyspnea or wheezes Cardiovascular: Doesn't report palpitation, chest discomfort  Gastrointestinal:  Doesn't report nausea, constipation, diarrhea GU: Doesn't report incontinence Skin: Doesn't report skin rashes Neurological: Per HPI Musculoskeletal: Doesn't report joint pain Behavioral/Psych: Doesn't report anxiety  Physical Exam: Vitals:  11/05/20 0958  BP: 113/76  Pulse: 84  Resp: 18  SpO2: 99%   KPS: 60. General: Alert, cooperative, pleasant, in no acute distress Head: Normal EENT: No conjunctival injection or scleral icterus.  Lungs: Resp effort normal Cardiac: Regular rate Abdomen: Non-distended abdomen Skin: No rashes cyanosis or petechiae. Extremities: No clubbing or edema  Neurologic Exam: Mental Status: Awake, alert, attentive to examiner. Oriented to self and environment. Language is fluent with intact comprehension.  Cranial Nerves: Visual acuity is grossly normal. Visual fields are full. Extra-ocular movements intact. No ptosis. Face is symmetric Motor: Tone and bulk are normal. He is near plegic in left arm and leg, no significant antigravity movement. Reflexes are symmetric, no pathologic reflexes present.  Sensory: Intact to light touch Gait: Deferred   Labs: I have reviewed the data as listed    Component Value Date/Time   NA 139 11/05/2020 0942   NA 137 07/14/2013 1031   K 3.4 (L) 11/05/2020 0942   K 3.8 07/14/2013 1031   CL 102 11/05/2020 0942   CL 107 07/14/2013 1031   CO2 26 11/05/2020 0942   CO2 25 07/14/2013 1031   GLUCOSE 93 11/05/2020 0942    GLUCOSE 125 (H) 07/14/2013 1031   BUN 20 11/05/2020 0942   BUN 9 07/14/2013 1031   CREATININE 0.69 11/05/2020 0942   CREATININE 0.98 07/14/2013 1031   CALCIUM 8.7 (L) 11/05/2020 0942   CALCIUM 8.7 07/14/2013 1031   PROT 6.2 (L) 11/05/2020 0942   ALBUMIN 3.5 11/05/2020 0942   AST 16 11/05/2020 0942   ALT 24 11/05/2020 0942   ALKPHOS 40 11/05/2020 0942   BILITOT 0.5 11/05/2020 0942   GFRNONAA >60 11/05/2020 0942   GFRNONAA >60 07/14/2013 1031   GFRAA >60 07/14/2013 1031   Lab Results  Component Value Date   WBC 24.7 (H) 11/05/2020   NEUTROABS 17.7 (H) 11/05/2020   HGB 13.5 11/05/2020   HCT 41.9 11/05/2020   MCV 91.9 11/05/2020   PLT 344 11/05/2020    Assessment/Plan Glioblastoma multiforme of brain ([place]) [C71.9]  RECTOR DEVONSHIRE is clinically stable today, now having completed 2 weeks of IMRT and concurrent Temozolomide.   We recommended continuing with course of intensity modulated radiation therapy and concurrent daily Temozolomide.  Radiation will be administered Mon-Fri over 6 weeks, Temodar will be dosed at 44m/m2 to be given daily over 42 days.  We reviewed side effects of temodar, including fatigue, nausea/vomiting, constipation, and cytopenias.  Chemotherapy should be held for the following:  ANC less than 1,000  Platelets less than 100,000  LFT or creatinine greater than 2x ULN  If clinical concerns/contraindications develop  Despite fatigue, recommended gradual withdrawal of decadron.  Burden of inflammation surrounding resection cavity on recent MRI was mild/moderate, and he is having some side effects (irritability, leg swelling).  Also noted is marked leukocytosis on CBC today in absence of infectious symptoms/fever, suspect effect of corticosteroids.  Recommended decreasing decadron to 360mdaily, and decreasing dose by 69m83mach week until at 0mg22me understands that steroids are not a good long term treatment for brain tumor fatigue, which is typically a  chronic symptom associated with the disease course.  He has completed course of ciprofloxacin, surgical site looks well healed today.  Should continue Keppra 1000mg67m.  Every 2 weeks during radiation, labs will be checked accompanied by a clinical evaluation in the brain tumor clinic.  Will return in 2 weeks.  All questions were answered. The patient knows to call  the clinic with any problems, questions or concerns. No barriers to learning were detected.  The total time spent in the encounter was 40 minutes and more than 50% was on counseling and review of test results   Ventura Sellers, MD Medical Director of Neuro-Oncology [facility] [place] 11/05/20 10:39 AM

## 2020-11-09 ENCOUNTER — Ambulatory Visit
Admission: RE | Admit: 2020-11-09 | Discharge: 2020-11-09 | Disposition: A | Discharge status: Home / Self Care | Payer: Commercial Managed Care - PPO | Source: Ambulatory Visit | Attending: Radiation Oncology | Admitting: Radiation Oncology

## 2020-11-09 DIAGNOSIS — C712 Malignant neoplasm of temporal lobe: Secondary | ICD-10-CM | POA: Diagnosis not present

## 2020-11-10 ENCOUNTER — Ambulatory Visit
Admission: RE | Admit: 2020-11-10 | Discharge: 2020-11-10 | Disposition: A | Discharge status: Home / Self Care | Payer: Commercial Managed Care - PPO | Source: Ambulatory Visit | Attending: Radiation Oncology | Admitting: Radiation Oncology

## 2020-11-10 DIAGNOSIS — C712 Malignant neoplasm of temporal lobe: Secondary | ICD-10-CM | POA: Insufficient documentation

## 2020-11-10 DIAGNOSIS — Z510 Encounter for antineoplastic radiation therapy: Secondary | ICD-10-CM | POA: Diagnosis present

## 2020-11-11 ENCOUNTER — Ambulatory Visit
Admission: RE | Admit: 2020-11-11 | Discharge: 2020-11-11 | Disposition: A | Discharge status: Home / Self Care | Payer: Commercial Managed Care - PPO | Source: Ambulatory Visit | Attending: Radiation Oncology | Admitting: Radiation Oncology

## 2020-11-11 DIAGNOSIS — C712 Malignant neoplasm of temporal lobe: Secondary | ICD-10-CM | POA: Diagnosis not present

## 2020-11-12 ENCOUNTER — Ambulatory Visit
Admission: RE | Admit: 2020-11-12 | Discharge: 2020-11-12 | Disposition: A | Discharge status: Home / Self Care | Payer: Commercial Managed Care - PPO | Source: Ambulatory Visit | Attending: Radiation Oncology | Admitting: Radiation Oncology

## 2020-11-12 DIAGNOSIS — C712 Malignant neoplasm of temporal lobe: Secondary | ICD-10-CM | POA: Diagnosis not present

## 2020-11-15 ENCOUNTER — Ambulatory Visit
Admission: RE | Admit: 2020-11-15 | Discharge: 2020-11-15 | Disposition: A | Discharge status: Home / Self Care | Payer: Commercial Managed Care - PPO | Source: Ambulatory Visit | Attending: Radiation Oncology | Admitting: Radiation Oncology

## 2020-11-15 DIAGNOSIS — C712 Malignant neoplasm of temporal lobe: Secondary | ICD-10-CM | POA: Diagnosis not present

## 2020-11-16 ENCOUNTER — Other Ambulatory Visit: Payer: Self-pay | Admitting: Internal Medicine

## 2020-11-16 ENCOUNTER — Ambulatory Visit
Admission: RE | Admit: 2020-11-16 | Discharge: 2020-11-16 | Disposition: A | Discharge status: Home / Self Care | Payer: Commercial Managed Care - PPO | Source: Ambulatory Visit | Attending: Radiation Oncology | Admitting: Radiation Oncology

## 2020-11-16 ENCOUNTER — Encounter: Payer: Self-pay | Admitting: Internal Medicine

## 2020-11-16 DIAGNOSIS — C719 Malignant neoplasm of brain, unspecified: Secondary | ICD-10-CM

## 2020-11-16 DIAGNOSIS — C712 Malignant neoplasm of temporal lobe: Secondary | ICD-10-CM | POA: Diagnosis not present

## 2020-11-17 ENCOUNTER — Ambulatory Visit
Admission: RE | Admit: 2020-11-17 | Discharge: 2020-11-17 | Disposition: A | Discharge status: Home / Self Care | Payer: Commercial Managed Care - PPO | Source: Ambulatory Visit | Attending: Radiation Oncology | Admitting: Radiation Oncology

## 2020-11-17 DIAGNOSIS — C712 Malignant neoplasm of temporal lobe: Secondary | ICD-10-CM | POA: Diagnosis not present

## 2020-11-17 MED ORDER — ELIQUIS 5 MG PO TABS
5.0000 mg | ORAL_TABLET | Freq: Two times a day (BID) | ORAL | 3 refills | Status: DC
Start: 2020-11-17 — End: 2021-03-21

## 2020-11-18 ENCOUNTER — Ambulatory Visit
Admission: RE | Admit: 2020-11-18 | Discharge: 2020-11-18 | Disposition: A | Discharge status: Home / Self Care | Payer: Commercial Managed Care - PPO | Source: Ambulatory Visit | Attending: Radiation Oncology | Admitting: Radiation Oncology

## 2020-11-18 DIAGNOSIS — C712 Malignant neoplasm of temporal lobe: Secondary | ICD-10-CM | POA: Diagnosis not present

## 2020-11-19 ENCOUNTER — Inpatient Hospital Stay (HOSPITAL_BASED_OUTPATIENT_CLINIC_OR_DEPARTMENT_OTHER): Payer: Commercial Managed Care - PPO | Admitting: Internal Medicine

## 2020-11-19 ENCOUNTER — Ambulatory Visit
Admission: RE | Admit: 2020-11-19 | Discharge: 2020-11-19 | Disposition: A | Discharge status: Home / Self Care | Payer: Commercial Managed Care - PPO | Source: Ambulatory Visit | Attending: Radiation Oncology | Admitting: Radiation Oncology

## 2020-11-19 ENCOUNTER — Encounter: Payer: Self-pay | Admitting: Internal Medicine

## 2020-11-19 ENCOUNTER — Inpatient Hospital Stay: Payer: Commercial Managed Care - PPO | Attending: Internal Medicine

## 2020-11-19 VITALS — BP 122/89 | HR 68 | Temp 98.2°F | Resp 16 | Wt 301.0 lb

## 2020-11-19 DIAGNOSIS — C719 Malignant neoplasm of brain, unspecified: Secondary | ICD-10-CM | POA: Diagnosis not present

## 2020-11-19 DIAGNOSIS — Z923 Personal history of irradiation: Secondary | ICD-10-CM | POA: Insufficient documentation

## 2020-11-19 DIAGNOSIS — C712 Malignant neoplasm of temporal lobe: Secondary | ICD-10-CM | POA: Insufficient documentation

## 2020-11-19 DIAGNOSIS — R569 Unspecified convulsions: Secondary | ICD-10-CM

## 2020-11-19 DIAGNOSIS — Z79899 Other long term (current) drug therapy: Secondary | ICD-10-CM | POA: Diagnosis not present

## 2020-11-19 LAB — COMPREHENSIVE METABOLIC PANEL
ALT: 30 U/L (ref 0–44)
AST: 19 U/L (ref 15–41)
Albumin: 3.6 g/dL (ref 3.5–5.0)
Alkaline Phosphatase: 37 U/L — ABNORMAL LOW (ref 38–126)
Anion gap: 12 (ref 5–15)
BUN: 14 mg/dL (ref 6–20)
CO2: 24 mmol/L (ref 22–32)
Calcium: 8.8 mg/dL — ABNORMAL LOW (ref 8.9–10.3)
Chloride: 102 mmol/L (ref 98–111)
Creatinine, Ser: 0.58 mg/dL — ABNORMAL LOW (ref 0.61–1.24)
GFR, Estimated: >60 mL/min (ref >60)
Glucose, Bld: 100 mg/dL — ABNORMAL HIGH (ref 70–99)
Potassium: 3.2 mmol/L — ABNORMAL LOW (ref 3.5–5.1)
Sodium: 138 mmol/L (ref 135–145)
Total Bilirubin: 0.3 mg/dL (ref 0.3–1.2)
Total Protein: 6.5 g/dL (ref 6.5–8.1)

## 2020-11-19 LAB — CBC WITH DIFFERENTIAL/PLATELET
Abs Immature Granulocytes: 0.09 10*3/uL — ABNORMAL HIGH (ref 0.00–0.07)
Basophils Absolute: 0 10*3/uL (ref 0.0–0.1)
Basophils Relative: 0 %
Eosinophils Absolute: 0.2 10*3/uL (ref 0.0–0.5)
Eosinophils Relative: 2 %
HCT: 43 % (ref 39.0–52.0)
Hemoglobin: 14.1 g/dL (ref 13.0–17.0)
Immature Granulocytes: 1 %
Lymphocytes Relative: 25 %
Lymphs Abs: 3.3 10*3/uL (ref 0.7–4.0)
MCH: 30.2 pg (ref 26.0–34.0)
MCHC: 32.8 g/dL (ref 30.0–36.0)
MCV: 92.1 fL (ref 80.0–100.0)
Monocytes Absolute: 1.3 10*3/uL — ABNORMAL HIGH (ref 0.1–1.0)
Monocytes Relative: 10 %
Neutro Abs: 8.1 10*3/uL — ABNORMAL HIGH (ref 1.7–7.7)
Neutrophils Relative %: 62 %
Platelets: 330 10*3/uL (ref 150–400)
RBC: 4.67 MIL/uL (ref 4.22–5.81)
RDW: 15.9 % — ABNORMAL HIGH (ref 11.5–15.5)
WBC: 13 10*3/uL — ABNORMAL HIGH (ref 4.0–10.5)
nRBC: 0 % (ref 0.0–0.2)

## 2020-11-19 NOTE — Progress Notes (Signed)
[facility] [address] [place], [place] [address] [phone]   Interval Evaluation  Date of Service: 11/19/20 Patient Name: Todd Villa Patient MRN: [id number] Patient DOB: 02/26/1971 Provider: Ventura Sellers, MD  Identifying Statement:  Todd Villa is a 50 y.o. male with right temporal glioblastoma   Oncologic History: Oncology History  Glioblastoma multiforme of brain [facility])  08/19/2020 Surgery   Craniotomy, resection of right temporal mass with Dr. Lacinda Axon; path demonstrates Glioblastoma IDH-1 wild type   09/16/2020 Surgery   Wound flap washout with Dr. Lacinda Axon; collection speciates proteus.  PICC line placed for cefepime therapy.   10/12/2020 -  Chemotherapy    Patient is on Treatment Plan: BRAIN GLIOBLASTOMA RADIATION THERAPY WITH CONCURRENT TEMOZOLOMIDE 75 MG/M2 DAILY FOLLOWED BY SEQUENTIAL MAINTENANCE TEMOZOLOMIDE X 6-12 CYCLES         Biomarkers:  MGMT Unknown.  IDH 1/2 Wild type.  EGFR Unknown  TERT Unknown   Interval History:  Todd Villa presents today for follow up, now having completed 4 weeks of radiation and Temozolomide.  No significant improvement in left sided weakness noted.  He continues to ambulate around the home with his walker, or sometimes by holding onto objects.  There was one fall recently which resulted in ankle pain, he is in a supportive boot today.  Despite that, he continues to work part time, still using phone and laptop for that at home.  Decadron he has tapered down to 29m daily as of 6/8.  H+P (09/24/20) Patient presents to clinic today for clinical review and treatment planning following repeat craniotomy last week.  During rehabilitation stay he developed soft tissue infection[facility] craniotomy and washout.  He was initially placed on IV antibiotics (cefepime), but was transitioned to ciprofloxacin upon discharge, which he is  taking currently.  He continues to complain of dense left sided weakness, modestly improved from prior.  He has been back working (from home) mostly using telephone and computer.  Does complain of mild fatigue and has been napping daily.      Medications: Current Outpatient Medications on File Prior to Visit  Medication Sig Dispense Refill   acetaminophen (TYLENOL) 325 MG tablet Take 2 tablets (650 mg total) by mouth 4 (four) times daily -  with meals and at bedtime.     acetaminophen (TYLENOL) 325 MG tablet Take 1,000 mg by mouth.     apixaban (ELIQUIS) 5 MG TABS tablet Take 1 tablet (5 mg total) by mouth 2 (two) times daily. 60 tablet 3   atorvastatin (LIPITOR) 10 MG tablet Take 10 mg by mouth daily.     bacitracin 500 UNIT/GM ointment Apply 1 application topically daily. Apply to incision site for 14 days.     bacitracin ointment Apply topically 2 (two) times daily. 120 g 0   dexamethasone (DECADRON) 1 MG tablet Take #336min AM x7 days, then 75m18mn AM x7 days, then 1mg81m AM x7 days, then STOP 60 tablet 0   doxycycline (VIBRA-TABS) 100 MG tablet Take 1 tablet (100 mg total) by mouth every 12 (twelve) hours.     FLUoxetine (PROZAC) 20 MG capsule Take 20 mg by mouth daily.     lansoprazole (PREVACID) 30 MG capsule Take 1 capsule (30 mg total) by mouth daily at 12 noon. 30 capsule 1   levETIRAcetam (KEPPRA) 750 MG tablet Take 2 tablets (1,500 mg total) by  mouth every 12 (twelve) hours. 120 tablet 0   melatonin 3 MG TABS tablet Take 1 tablet (3 mg total) by mouth at bedtime.  0   ondansetron (ZOFRAN) 8 MG tablet Take 1 tablet (8 mg total) by mouth 2 (two) times daily as needed (nausea and vomiting). May take 30-60 minutes prior to Temodar administration if nausea/vomiting occurs. 30 tablet 1   oxyCODONE (OXY IR/ROXICODONE) 5 MG immediate release tablet Take 5 mg by mouth every 4 (four) hours as needed (for pain not relieved by tylenol).     pantoprazole (PROTONIX) 40 MG tablet Take 40 mg by mouth  daily.     polyethylene glycol (MIRALAX / GLYCOLAX) 17 g packet Take 17 g by mouth daily.     senna-docusate (SENOKOT-S) 8.6-50 MG tablet Take 2 tablets by mouth 2 (two) times daily.     temozolomide (TEMODAR) 100 MG capsule Take 2 capsules (200 mg total) by mouth daily. May take on an empty stomach to decrease nausea & vomiting. 84 capsule 0   No current facility-administered medications on file prior to visit.    Allergies:  Allergies  Allergen Reactions   Nuedexta [Dextromethorphan-Quinidine]     Psychosis    Penicillins Other (See Comments)    Childhood reaction - patient is unsure of what happened.   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   Past Surgical History:  Past Surgical History:  Procedure Laterality Date   IR IVC FILTER PLMT / S&I /IMG GUID/MOD SED  08/29/2020   ROTATOR CUFF REPAIR Left 2015   Social History:  Social History   Socioeconomic History   Marital status: Married    Spouse name: Not on file   Number of children: Not on file   Years of education: Not on file   Highest education level: Not on file  Occupational History   Not on file  Tobacco Use   Smoking status: Every Day    Packs/day: 0.66    Years: 30.00    Pack years: 19.80    Types: Cigarettes   Smokeless tobacco: Never  Substance and Sexual Activity   Alcohol use: No    Alcohol/week: 0.0 standard drinks   Drug use: No   Sexual activity: Yes  Other Topics Concern   Not on file  Social History Narrative   Not on file   Social Determinants of Health   Financial Resource Strain: Not on file  Food Insecurity: Not on file  Transportation Needs: Not on file  Physical Activity: Not on file  Stress: Not on file  Social Connections: Not on file  Intimate Partner Violence: Not on file   Family History:  Family History  Problem Relation Age of Onset   Hyperlipidemia Mother     Heart disease Mother    Hypertension Mother    Diabetes Mother    Alcohol abuse Father    Heart disease Father    Hypertension Father    Diabetes Sister    Cancer Neg Hx    Stroke Neg Hx     Review of Systems: Constitutional: Doesn't report fevers, chills or abnormal weight loss Eyes: Doesn't report blurriness of vision Ears, nose, mouth, throat, and face: Doesn't report sore throat Respiratory: Doesn't report cough, dyspnea or wheezes Cardiovascular: Doesn't report palpitation, chest discomfort  Gastrointestinal:  Doesn't report nausea, constipation, diarrhea GU: Doesn't report  incontinence Skin: Doesn't report skin rashes Neurological: Per HPI Musculoskeletal: Doesn't report joint pain Behavioral/Psych: Doesn't report anxiety  Physical Exam: Vitals:   11/19/20 1012  BP: 122/89  Pulse: 68  Resp: 16  Temp: 98.2 F (36.8 C)  SpO2: 99%    KPS: 60. General: Alert, cooperative, pleasant, in no acute distress Head: Normal EENT: No conjunctival injection or scleral icterus.  Lungs: Resp effort normal Cardiac: Regular rate Abdomen: Non-distended abdomen Skin: No rashes cyanosis or petechiae. Extremities: No clubbing or edema  Neurologic Exam: Mental Status: Awake, alert, attentive to examiner. Oriented to self and environment. Language is fluent with intact comprehension.  Cranial Nerves: Visual acuity is grossly normal. Visual fields are full. Extra-ocular movements intact. No ptosis. Face is symmetric Motor: Tone and bulk are normal. He is near plegic in left arm and leg, no significant antigravity movement. Reflexes are symmetric, no pathologic reflexes present.  Sensory: Intact to light touch Gait: Deferred   Labs: I have reviewed the data as listed    Component Value Date/Time   NA 139 11/05/2020 0942   NA 137 07/14/2013 1031   K 3.4 (L) 11/05/2020 0942   K 3.8 07/14/2013 1031   CL 102 11/05/2020 0942   CL 107 07/14/2013 1031   CO2 26 11/05/2020 0942    CO2 25 07/14/2013 1031   GLUCOSE 93 11/05/2020 0942   GLUCOSE 125 (H) 07/14/2013 1031   BUN 20 11/05/2020 0942   BUN 9 07/14/2013 1031   CREATININE 0.69 11/05/2020 0942   CREATININE 0.98 07/14/2013 1031   CALCIUM 8.7 (L) 11/05/2020 0942   CALCIUM 8.7 07/14/2013 1031   PROT 6.2 (L) 11/05/2020 0942   ALBUMIN 3.5 11/05/2020 0942   AST 16 11/05/2020 0942   ALT 24 11/05/2020 0942   ALKPHOS 40 11/05/2020 0942   BILITOT 0.5 11/05/2020 0942   GFRNONAA >60 11/05/2020 0942   GFRNONAA >60 07/14/2013 1031   GFRAA >60 07/14/2013 1031   Lab Results  Component Value Date   WBC 24.7 (H) 11/05/2020   NEUTROABS 17.7 (H) 11/05/2020   HGB 13.5 11/05/2020   HCT 41.9 11/05/2020   MCV 91.9 11/05/2020   PLT 344 11/05/2020    Assessment/Plan Glioblastoma multiforme of brain ([place]) [C71.9]  Todd Villa is clinically stable today, now having completed 4 weeks of IMRT and concurrent Temozolomide.   We recommended continuing with course of intensity modulated radiation therapy and concurrent daily Temozolomide.  Radiation will be administered Mon-Fri over 6 weeks, Temodar will be dosed at 44m/m2 to be given daily over 42 days.  We reviewed side effects of temodar, including fatigue, nausea/vomiting, constipation, and cytopenias.  Chemotherapy should be held for the following:  ANC less than 1,000  Platelets less than 100,000  LFT or creatinine greater than 2x ULN  If clinical concerns/contraindications develop  He should continue decreasing decadron by 126meach week until is is discontinued.  Should continue Keppra 100010mID.  Every 2 weeks during radiation, labs will be checked accompanied by a clinical evaluation in the brain tumor clinic.  Will return in 2 weeks for final RT lab check and visit.  All questions were answered. The patient knows to call the clinic with any problems, questions or concerns. No barriers to learning were detected.  The total time spent in the encounter  was 30 minutes and more than 50% was on counseling and review of test results   ZacVentura SellersD Medical Director of Neuro-Oncology [facility]  [place] 11/19/20 9:34 AM
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
## 2020-11-22 ENCOUNTER — Ambulatory Visit
Admission: RE | Admit: 2020-11-22 | Discharge: 2020-11-22 | Disposition: A | Discharge status: Home / Self Care | Payer: Commercial Managed Care - PPO | Source: Ambulatory Visit | Attending: Radiation Oncology | Admitting: Radiation Oncology

## 2020-11-22 DIAGNOSIS — C712 Malignant neoplasm of temporal lobe: Secondary | ICD-10-CM | POA: Diagnosis not present

## 2020-11-23 ENCOUNTER — Ambulatory Visit
Admission: RE | Admit: 2020-11-23 | Discharge: 2020-11-23 | Disposition: A | Discharge status: Home / Self Care | Payer: Commercial Managed Care - PPO | Source: Ambulatory Visit | Attending: Radiation Oncology | Admitting: Radiation Oncology

## 2020-11-23 DIAGNOSIS — C712 Malignant neoplasm of temporal lobe: Secondary | ICD-10-CM | POA: Diagnosis not present

## 2020-11-24 ENCOUNTER — Ambulatory Visit
Admission: RE | Admit: 2020-11-24 | Discharge: 2020-11-24 | Disposition: A | Discharge status: Home / Self Care | Payer: Commercial Managed Care - PPO | Source: Ambulatory Visit | Attending: Radiation Oncology | Admitting: Radiation Oncology

## 2020-11-24 DIAGNOSIS — C712 Malignant neoplasm of temporal lobe: Secondary | ICD-10-CM | POA: Diagnosis not present

## 2020-11-25 ENCOUNTER — Ambulatory Visit
Admission: RE | Admit: 2020-11-25 | Discharge: 2020-11-25 | Disposition: A | Discharge status: Home / Self Care | Payer: Commercial Managed Care - PPO | Source: Ambulatory Visit | Attending: Radiation Oncology | Admitting: Radiation Oncology

## 2020-11-25 DIAGNOSIS — C712 Malignant neoplasm of temporal lobe: Secondary | ICD-10-CM | POA: Diagnosis not present

## 2020-11-26 ENCOUNTER — Ambulatory Visit
Admission: RE | Admit: 2020-11-26 | Discharge: 2020-11-26 | Disposition: A | Discharge status: Home / Self Care | Payer: Commercial Managed Care - PPO | Source: Ambulatory Visit | Attending: Radiation Oncology | Admitting: Radiation Oncology

## 2020-11-26 DIAGNOSIS — C712 Malignant neoplasm of temporal lobe: Secondary | ICD-10-CM | POA: Diagnosis not present

## 2020-11-29 ENCOUNTER — Ambulatory Visit
Admission: RE | Admit: 2020-11-29 | Discharge: 2020-11-29 | Disposition: A | Discharge status: Home / Self Care | Payer: Commercial Managed Care - PPO | Source: Ambulatory Visit | Attending: Radiation Oncology | Admitting: Radiation Oncology

## 2020-11-29 DIAGNOSIS — C712 Malignant neoplasm of temporal lobe: Secondary | ICD-10-CM | POA: Diagnosis not present

## 2020-11-30 ENCOUNTER — Ambulatory Visit
Admission: RE | Admit: 2020-11-30 | Discharge: 2020-11-30 | Disposition: A | Discharge status: Home / Self Care | Payer: Commercial Managed Care - PPO | Source: Ambulatory Visit | Attending: Radiation Oncology | Admitting: Radiation Oncology

## 2020-11-30 DIAGNOSIS — C712 Malignant neoplasm of temporal lobe: Secondary | ICD-10-CM | POA: Diagnosis not present

## 2020-12-01 ENCOUNTER — Ambulatory Visit
Admission: RE | Admit: 2020-12-01 | Discharge: 2020-12-01 | Disposition: A | Discharge status: Home / Self Care | Payer: Commercial Managed Care - PPO | Source: Ambulatory Visit | Attending: Radiation Oncology | Admitting: Radiation Oncology

## 2020-12-01 DIAGNOSIS — C712 Malignant neoplasm of temporal lobe: Secondary | ICD-10-CM | POA: Diagnosis not present

## 2020-12-02 ENCOUNTER — Ambulatory Visit
Admission: RE | Admit: 2020-12-02 | Discharge: 2020-12-02 | Disposition: A | Discharge status: Home / Self Care | Payer: Commercial Managed Care - PPO | Source: Ambulatory Visit | Attending: Radiation Oncology | Admitting: Radiation Oncology

## 2020-12-02 DIAGNOSIS — C712 Malignant neoplasm of temporal lobe: Secondary | ICD-10-CM | POA: Diagnosis not present

## 2020-12-03 ENCOUNTER — Other Ambulatory Visit: Payer: Self-pay

## 2020-12-03 ENCOUNTER — Ambulatory Visit
Admission: RE | Admit: 2020-12-03 | Discharge: 2020-12-03 | Disposition: A | Discharge status: Home / Self Care | Payer: Commercial Managed Care - PPO | Source: Ambulatory Visit | Attending: Radiation Oncology | Admitting: Radiation Oncology

## 2020-12-03 ENCOUNTER — Encounter: Payer: Self-pay | Admitting: Internal Medicine

## 2020-12-03 ENCOUNTER — Inpatient Hospital Stay: Payer: Commercial Managed Care - PPO

## 2020-12-03 ENCOUNTER — Inpatient Hospital Stay (HOSPITAL_BASED_OUTPATIENT_CLINIC_OR_DEPARTMENT_OTHER): Payer: Commercial Managed Care - PPO | Admitting: Internal Medicine

## 2020-12-03 VITALS — BP 123/85 | HR 101 | Temp 98.4°F | Resp 20

## 2020-12-03 DIAGNOSIS — C719 Malignant neoplasm of brain, unspecified: Secondary | ICD-10-CM

## 2020-12-03 DIAGNOSIS — R569 Unspecified convulsions: Secondary | ICD-10-CM

## 2020-12-03 DIAGNOSIS — C712 Malignant neoplasm of temporal lobe: Secondary | ICD-10-CM | POA: Diagnosis not present

## 2020-12-03 LAB — COMPREHENSIVE METABOLIC PANEL
ALT: 21 U/L (ref 0–44)
AST: 15 U/L (ref 15–41)
Albumin: 3.5 g/dL (ref 3.5–5.0)
Alkaline Phosphatase: 41 U/L (ref 38–126)
Anion gap: 9 (ref 5–15)
BUN: 16 mg/dL (ref 6–20)
CO2: 27 mmol/L (ref 22–32)
Calcium: 8.9 mg/dL (ref 8.9–10.3)
Chloride: 105 mmol/L (ref 98–111)
Creatinine, Ser: 0.73 mg/dL (ref 0.61–1.24)
GFR, Estimated: >60 mL/min (ref >60)
Glucose, Bld: 127 mg/dL — ABNORMAL HIGH (ref 70–99)
Potassium: 3.5 mmol/L (ref 3.5–5.1)
Sodium: 141 mmol/L (ref 135–145)
Total Bilirubin: 0.7 mg/dL (ref 0.3–1.2)
Total Protein: 6.4 g/dL — ABNORMAL LOW (ref 6.5–8.1)

## 2020-12-03 LAB — CBC WITH DIFFERENTIAL/PLATELET
Abs Immature Granulocytes: 0.06 10*3/uL (ref 0.00–0.07)
Basophils Absolute: 0 10*3/uL (ref 0.0–0.1)
Basophils Relative: 0 %
Eosinophils Absolute: 0.1 10*3/uL (ref 0.0–0.5)
Eosinophils Relative: 1 %
HCT: 43 % (ref 39.0–52.0)
Hemoglobin: 14.3 g/dL (ref 13.0–17.0)
Immature Granulocytes: 1 %
Lymphocytes Relative: 14 %
Lymphs Abs: 1.5 10*3/uL (ref 0.7–4.0)
MCH: 30.3 pg (ref 26.0–34.0)
MCHC: 33.3 g/dL (ref 30.0–36.0)
MCV: 91.1 fL (ref 80.0–100.0)
Monocytes Absolute: 0.9 10*3/uL (ref 0.1–1.0)
Monocytes Relative: 9 %
Neutro Abs: 8 10*3/uL — ABNORMAL HIGH (ref 1.7–7.7)
Neutrophils Relative %: 75 %
Platelets: 238 10*3/uL (ref 150–400)
RBC: 4.72 MIL/uL (ref 4.22–5.81)
RDW: 15.7 % — ABNORMAL HIGH (ref 11.5–15.5)
WBC: 10.6 10*3/uL — ABNORMAL HIGH (ref 4.0–10.5)
nRBC: 0 % (ref 0.0–0.2)

## 2020-12-03 NOTE — Progress Notes (Signed)
[facility], [place] [address] [phone]   Interval Evaluation  Date of Service: 12/03/20 Patient Name: Todd Villa Patient MRN: [id number] Patient DOB: 17-Aug-1970 Provider: Ventura Sellers, MD  Identifying Statement:  Todd Villa is a 50 y.o. male with right temporal glioblastoma   Oncologic History: Oncology History  Glioblastoma multiforme of brain [facility])  08/19/2020 Surgery   Craniotomy, resection of right temporal mass with Dr. Lacinda Axon; path demonstrates Glioblastoma IDH-1 wild type   09/16/2020 Surgery   Wound flap washout with Dr. Lacinda Axon; collection speciates proteus.  PICC line placed for cefepime therapy.   10/25/2020 - 12/03/2020 Radiation Therapy   IMRT and concurrent Temodar 54m/m2     Biomarkers:  MGMT Unknown.  IDH 1/2 Wild type.  EGFR Unknown  TERT Unknown   Interval History:  Todd DEMARESTpresents today for follow up, now having completed all 6 weeks of radiation and Temozolomide.  Continues to experience dense left sided weakness.  No recent falls, ankle has continued to heal.  Despite that, he continues to work part time, still using phone and laptop for that at home.  Formally discontinued decadron.  H+P (09/24/20) Patient presents to clinic today for clinical review and treatment planning following repeat craniotomy last week.  During rehabilitation stay [facility] developed soft tissue infection[facility] and washout.  He was initially placed on IV antibiotics (cefepime), but was transitioned to ciprofloxacin upon discharge, which he is taking currently.  He continues to complain of dense left sided weakness, modestly improved from prior.  He has been back working (from home) mostly using telephone and computer.  Does complain of mild fatigue and has been napping daily.      Medications: Current Outpatient Medications on File  Prior to Visit  Medication Sig Dispense Refill   Acetaminophen 500 MG capsule Take 1,000 mg by mouth 2 (two) times daily.     apixaban (ELIQUIS) 5 MG TABS tablet Take 1 tablet (5 mg total) by mouth 2 (two) times daily. 60 tablet 3   atorvastatin (LIPITOR) 10 MG tablet Take 10 mg by mouth daily.     dexamethasone (DECADRON) 1 MG tablet Take #314min AM x7 days, then 69m19mn AM x7 days, then 1mg23m AM x7 days, then STOP 60 tablet 0   FLUoxetine (PROZAC) 20 MG capsule Take 20 mg by mouth daily.     levETIRAcetam (KEPPRA) 750 MG tablet Take 2 tablets (1,500 mg total) by mouth every 12 (twelve) hours. 120 tablet 0   ondansetron (ZOFRAN) 8 MG tablet Take 1 tablet (8 mg total) by mouth 2 (two) times daily as needed (nausea and vomiting). May take 30-60 minutes prior to Temodar administration if nausea/vomiting occurs. 30 tablet 1   pantoprazole (PROTONIX) 40 MG tablet Take 40 mg by mouth daily.     temozolomide (TEMODAR) 100 MG capsule Take 2 capsules (200 mg total) by mouth daily. May take on an empty stomach to decrease nausea & vomiting. 84 capsule 0   No current facility-administered medications on file prior to visit.    Allergies:  Allergies  Allergen Reactions   Nuedexta [Dextromethorphan-Quinidine]     Psychosis    Penicillins Other (See Comments)    Childhood reaction - patient is unsure of what happened.   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   Past Surgical History:  Past Surgical History:  Procedure Laterality Date   IR IVC FILTER PLMT / S&I /IMG GUID/MOD SED  08/29/2020   ROTATOR CUFF REPAIR Left 2015   Social History:  Social History   Socioeconomic History   Marital status: Married    Spouse name: Not on file   Number of children: Not on file   Years of education: Not on file   Highest education level: Not on file  Occupational History   Not on file   Tobacco Use   Smoking status: Every Day    Packs/day: 0.66    Years: 30.00    Pack years: 19.80    Types: Cigarettes   Smokeless tobacco: Never  Substance and Sexual Activity   Alcohol use: No    Alcohol/week: 0.0 standard drinks   Drug use: No   Sexual activity: Yes  Other Topics Concern   Not on file  Social History Narrative   Not on file   Social Determinants of Health   Financial Resource Strain: Not on file  Food Insecurity: Not on file  Transportation Needs: Not on file  Physical Activity: Not on file  Stress: Not on file  Social Connections: Not on file  Intimate Partner Violence: Not on file   Family History:  Family History  Problem Relation Age of Onset   Hyperlipidemia Mother    Heart disease Mother    Hypertension Mother    Diabetes Mother    Alcohol abuse Father    Heart disease Father    Hypertension Father    Diabetes Sister    Cancer Neg Hx    Stroke Neg Hx     Review of Systems: Constitutional: Doesn't report fevers, chills or abnormal weight loss Eyes: Doesn't report blurriness of vision Ears, nose, mouth, throat, and face: Doesn't report sore throat Respiratory: Doesn't report cough, dyspnea or wheezes Cardiovascular: Doesn't report palpitation, chest discomfort  Gastrointestinal:  Doesn't report nausea, constipation, diarrhea GU: Doesn't report incontinence Skin: Doesn't report skin rashes Neurological: Per HPI Musculoskeletal: Doesn't report joint pain Behavioral/Psych: Doesn't report anxiety  Physical Exam: Vitals:   12/03/20 0945  BP: 123/85  Pulse: (!) 101  Resp: 20  Temp: 98.4 F (36.9 C)  SpO2: 97%    KPS: 60. General: Alert, cooperative, pleasant, in no acute distress Head: Normal EENT: No conjunctival injection or scleral icterus.  Lungs: Resp effort normal Cardiac: Regular rate Abdomen: Non-distended abdomen Skin: No rashes cyanosis or petechiae. Extremities: No clubbing or edema  Neurologic Exam: Mental  Status: Awake, alert, attentive to examiner. Oriented to self and environment. Language is fluent with intact comprehension.  Cranial Nerves: Visual acuity is grossly normal. Visual fields are full. Extra-ocular movements intact. No ptosis. Face is symmetric Motor: Tone and bulk are normal. He is near plegic in left arm and leg, no significant antigravity movement. Reflexes are symmetric, no pathologic reflexes present.  Sensory: Intact to light touch Gait: Deferred   Labs: I have reviewed the data as listed    Component Value Date/Time   NA 138 11/19/2020 0928   NA 137 07/14/2013 1031   K 3.2 (L) 11/19/2020 0928   K 3.8 07/14/2013 1031   CL 102 11/19/2020 0928   CL 107 07/14/2013 1031   CO2 24 11/19/2020 0928   CO2 25 07/14/2013 1031   GLUCOSE 100 (H) 11/19/2020 0928   GLUCOSE 125 (H) 07/14/2013 1031  BUN 14 11/19/2020 0928   BUN 9 07/14/2013 1031   CREATININE 0.58 (L) 11/19/2020 0928   CREATININE 0.98 07/14/2013 1031   CALCIUM 8.8 (L) 11/19/2020 0928   CALCIUM 8.7 07/14/2013 1031   PROT 6.5 11/19/2020 0928   ALBUMIN 3.6 11/19/2020 0928   AST 19 11/19/2020 0928   ALT 30 11/19/2020 0928   ALKPHOS 37 (L) 11/19/2020 0928   BILITOT 0.3 11/19/2020 0928   GFRNONAA >60 11/19/2020 0928   GFRNONAA >60 07/14/2013 1031   GFRAA >60 07/14/2013 1031   Lab Results  Component Value Date   WBC 10.6 (H) 12/03/2020   NEUTROABS 8.0 (H) 12/03/2020   HGB 14.3 12/03/2020   HCT 43.0 12/03/2020   MCV 91.1 12/03/2020   PLT 238 12/03/2020    Assessment/Plan Glioblastoma multiforme of brain ([place]) [C71.9]  Todd Villa is clinically stable today, now having completed 6 weeks of IMRT and concurrent Temozolomide.  No new or progressive deficits.  We recommended completing course of intensity modulated radiation therapy and concurrent daily Temozolomide.  Radiation will be administered Mon-Fri over 6 weeks, Temodar will be dosed at 75m/m2 to be given daily over 42 days.  We reviewed side  effects of temodar, including fatigue, nausea/vomiting, constipation, and cytopenias.  Chemotherapy should be held for the following:  ANC less than 1,000  Platelets less than 100,000  LFT or creatinine greater than 2x ULN  If clinical concerns/contraindications develop  Should continue Keppra 10063mBID.  We ask that Todd SOULESeturn to clinic in 1 months following post-RT brain MRI, or sooner as needed.  All questions were answered. The patient knows to call the clinic with any problems, questions or concerns. No barriers to learning were detected.  The total time spent in the encounter was 30 minutes and more than 50% was on counseling and review of test results   Todd SellersMD Medical Director of Neuro-Oncology [facility] WeStarbuck6/24/22 9:57 AM

## 2020-12-06 ENCOUNTER — Ambulatory Visit
Admission: RE | Admit: 2020-12-06 | Discharge: 2020-12-06 | Disposition: A | Discharge status: Home / Self Care | Payer: Commercial Managed Care - PPO | Source: Ambulatory Visit | Attending: Radiation Oncology | Admitting: Radiation Oncology

## 2020-12-06 DIAGNOSIS — C712 Malignant neoplasm of temporal lobe: Secondary | ICD-10-CM | POA: Diagnosis not present

## 2020-12-07 ENCOUNTER — Ambulatory Visit
Admission: RE | Admit: 2020-12-07 | Discharge: 2020-12-07 | Disposition: A | Discharge status: Home / Self Care | Payer: Commercial Managed Care - PPO | Source: Ambulatory Visit | Attending: Radiation Oncology | Admitting: Radiation Oncology

## 2020-12-07 DIAGNOSIS — C712 Malignant neoplasm of temporal lobe: Secondary | ICD-10-CM | POA: Diagnosis not present

## 2020-12-09 ENCOUNTER — Other Ambulatory Visit: Payer: Self-pay | Admitting: Interventional Radiology

## 2020-12-09 ENCOUNTER — Other Ambulatory Visit: Payer: Self-pay | Admitting: *Deleted

## 2020-12-09 DIAGNOSIS — Z95828 Presence of other vascular implants and grafts: Secondary | ICD-10-CM

## 2020-12-09 DIAGNOSIS — C719 Malignant neoplasm of brain, unspecified: Secondary | ICD-10-CM

## 2020-12-09 NOTE — Progress Notes (Signed)
Patient is being discharged from [facility] rehab next week because he has achieved status to be able to get to and from outpatient rehab.    Marlowe Kays from [facility] requested referral for PT and OT to be sent to [facility] rehab for NEURO PT and NEURO OT.  Faxed to [phone]

## 2020-12-14 ENCOUNTER — Other Ambulatory Visit: Payer: Self-pay | Admitting: Interventional Radiology

## 2020-12-14 DIAGNOSIS — Z95828 Presence of other vascular implants and grafts: Secondary | ICD-10-CM

## 2020-12-17 ENCOUNTER — Encounter: Payer: Self-pay | Admitting: Internal Medicine

## 2020-12-18 ENCOUNTER — Encounter: Payer: Self-pay | Admitting: Internal Medicine

## 2020-12-18 DIAGNOSIS — C719 Malignant neoplasm of brain, unspecified: Secondary | ICD-10-CM

## 2020-12-20 ENCOUNTER — Encounter: Payer: Self-pay | Admitting: Internal Medicine

## 2020-12-20 MED ORDER — LEVETIRACETAM 750 MG PO TABS: 1500.0000 mg | ORAL_TABLET | Freq: Two times a day (BID) | ORAL | 3 refills | Status: DC

## 2020-12-20 MED ORDER — DEXAMETHASONE 4 MG PO TABS: 4.0000 mg | ORAL_TABLET | Freq: Every day | ORAL | 3 refills | Status: DC

## 2020-12-29 ENCOUNTER — Other Ambulatory Visit: Payer: Self-pay | Admitting: Radiation Therapy

## 2021-01-05 ENCOUNTER — Ambulatory Visit
Admission: RE | Admit: 2021-01-05 | Discharge: 2021-01-05 | Disposition: A | Discharge status: Home / Self Care | Payer: Commercial Managed Care - PPO | Source: Ambulatory Visit | Attending: Internal Medicine | Admitting: Internal Medicine

## 2021-01-05 ENCOUNTER — Other Ambulatory Visit: Payer: Self-pay

## 2021-01-05 DIAGNOSIS — C719 Malignant neoplasm of brain, unspecified: Secondary | ICD-10-CM | POA: Diagnosis not present

## 2021-01-05 MED ORDER — GADOBUTROL 1 MMOL/ML IV SOLN
10.0000 mL | Freq: Once | INTRAVENOUS | Status: AC | PRN
  Administered 2021-01-05: 10 mL via INTRAVENOUS

## 2021-01-06 ENCOUNTER — Ambulatory Visit: Payer: Commercial Managed Care - PPO | Admitting: Radiation Oncology

## 2021-01-07 ENCOUNTER — Inpatient Hospital Stay: Payer: Commercial Managed Care - PPO | Attending: Internal Medicine | Admitting: Internal Medicine

## 2021-01-07 ENCOUNTER — Ambulatory Visit
Admission: RE | Admit: 2021-01-07 | Discharge: 2021-01-07 | Disposition: A | Discharge status: Home / Self Care | Payer: Commercial Managed Care - PPO | Source: Ambulatory Visit | Attending: Radiation Oncology | Admitting: Radiation Oncology

## 2021-01-07 ENCOUNTER — Telehealth: Payer: Self-pay | Admitting: Pharmacist

## 2021-01-07 ENCOUNTER — Other Ambulatory Visit (HOSPITAL_COMMUNITY): Payer: Self-pay

## 2021-01-07 ENCOUNTER — Encounter: Payer: Self-pay | Admitting: Internal Medicine

## 2021-01-07 VITALS — BP 137/95 | HR 66 | Temp 97.9°F | Resp 18 | Wt 298.0 lb

## 2021-01-07 DIAGNOSIS — R569 Unspecified convulsions: Secondary | ICD-10-CM | POA: Diagnosis not present

## 2021-01-07 DIAGNOSIS — C719 Malignant neoplasm of brain, unspecified: Secondary | ICD-10-CM

## 2021-01-07 DIAGNOSIS — Z79899 Other long term (current) drug therapy: Secondary | ICD-10-CM | POA: Diagnosis not present

## 2021-01-07 DIAGNOSIS — Z923 Personal history of irradiation: Secondary | ICD-10-CM | POA: Insufficient documentation

## 2021-01-07 DIAGNOSIS — C712 Malignant neoplasm of temporal lobe: Secondary | ICD-10-CM | POA: Insufficient documentation

## 2021-01-07 MED ORDER — DEXAMETHASONE 1 MG PO TABS: 3.0000 mg | ORAL_TABLET | Freq: Every day | ORAL | 1 refills | Status: DC

## 2021-01-07 MED ORDER — TEMOZOLOMIDE 100 MG PO CAPS: 150.0000 mg/m2/d | ORAL_CAPSULE | Freq: Every day | ORAL | 0 refills | Status: DC

## 2021-01-07 MED ORDER — ONDANSETRON HCL 8 MG PO TABS
8.0000 mg | ORAL_TABLET | Freq: Two times a day (BID) | ORAL | 1 refills | Status: DC | PRN
  Filled 2021-01-07: qty 30, 15d supply, fill #0

## 2021-01-07 MED ORDER — TEMOZOLOMIDE 100 MG PO CAPS
150.0000 mg/m2/d | ORAL_CAPSULE | Freq: Every day | ORAL | 0 refills | Status: DC
  Filled 2021-01-07: qty 20, 5d supply, fill #0

## 2021-01-07 MED ORDER — ONDANSETRON HCL 8 MG PO TABS
8.0000 mg | ORAL_TABLET | Freq: Two times a day (BID) | ORAL | 1 refills | Status: AC | PRN
Start: 2021-01-07 — End: ?

## 2021-01-07 NOTE — Progress Notes (Signed)
DISCONTINUE ON PATHWAY REGIMEN - Neuro     One cycle, concurrent with RT:     Temozolomide   **Always confirm dose/schedule in your pharmacy ordering system**  REASON: Continuation Of Treatment PRIOR TREATMENT: [id number]: Radiation Therapy with Concurrent Temozolomide 75 mg/m2 Daily x 6 Weeks, Followed by Adjuvant Temozolomide TREATMENT RESPONSE: Progressive Disease (PD)  START ON PATHWAY REGIMEN - Neuro     A cycle is every 28 days:     Temozolomide      Temozolomide   **Always confirm dose/schedule in your pharmacy ordering system**  Patient Characteristics: Glioblastoma (Grade 4 Glioma), Newly Diagnosed / Treatment Naive, Good Performance Status and/or Younger Patient, MGMT Promoter Unmethylated/Unknown Disease Classification: Glioma Disease Classification: Glioblastoma (Grade 4 Glioma) Disease Status: Newly Diagnosed / Treatment Naive Performance Status: Good Performance Status and/or Younger Patient MGMT Promoter Methylation Status: Awaiting Test Results Intent of Therapy: Non-Curative / Palliative Intent, Discussed with Patient

## 2021-01-07 NOTE — Progress Notes (Signed)
Radiation Oncology Follow up Note  Name: Todd Villa   Date:   01/07/2021 MRN:  [id number] DOB: [birth year]    This 50 y.o. male presents to the clinic today for 1 month follow-up status post external beam radiation therapy for right temporal GBM status post resection.  REFERRING PROVIDER: Donnamarie Rossetti,*  HPI: Patient is a 50 year old male now at 1 month having completed external beam radiation therapy for a right temporal GBM.  He is seen today in routine follow-up clinically he is doing well.  He is having some increased strength on his left side mostly his lower extremities.  He is having no significant pain or headaches at this time.Marland Kitchen  MRI scan this week did show right parietal temporal progression with multiple new areas of nodular contrast-enhancement and worsening surrounding hyperintense T2 weighted signal.  He is started on elevated dose of Temodar.  COMPLICATIONS OF TREATMENT: none  FOLLOW UP COMPLIANCE: keeps appointments   PHYSICAL EXAM:  There were no vitals taken for this visit. Patient still has subtle left-sided weakness although this seems to be increased strength in his left lower extremity.  Crude visual fields within normal range.  Cranial nerves II through XII are grossly intact.  Well-developed well-nourished patient in NAD. HEENT reveals PERLA, EOMI, discs not visualized.  Oral cavity is clear. No oral mucosal lesions are identified. Neck is clear without evidence of cervical or supraclavicular adenopathy. Lungs are clear to A&P. Cardiac examination is essentially unremarkable with regular rate and rhythm without murmur rub or thrill. Abdomen is benign with no organomegaly or masses noted. Motor sensory and DTR levels are equal and symmetric in the upper and lower extremities. Cranial nerves II through XII are grossly intact. Proprioception is intact. No peripheral adenopathy or edema is identified. No motor or sensory levels are noted. Crude visual fields are  within normal range.  RADIOLOGY RESULTS: MRI scan reviewed  PLAN: Present time patient continues treatment with Temodar.  I have asked to see him back in 3 to 4 months for follow-up.  Patient and family know to call with any concerns at any time.  I would like to take this opportunity to thank you for allowing me to participate in the care of your patient.Noreene Filbert, MD

## 2021-01-07 NOTE — Progress Notes (Signed)
Patient here for neuro follow-up appointment, expresses no new concerns today

## 2021-01-07 NOTE — Telephone Encounter (Signed)
Oral Oncology Pharmacist Encounter  Received new prescription for Temodar (temozolomide) for the maintenance treatment of glioblastoma multiforme, planned duration until disease progression or unacceptable drug toxicity.  CMP from 12/03/20 assessed, no relevant lab abnormalities. Prescription dose and frequency assessed.   Current medication list in Epic reviewed, no DDIs with temozolimide identified.  Evaluated chart and no patient barriers to medication adherence identified.   Prescription has been e-scribed to the [facility]  Oral Oncology Clinic will continue to follow for insurance authorization, copayment issues, initial counseling and start date.  Patient agreed to treatment on 01/07/21 per MD documentation.  Darl Pikes, PharmD, BCPS, BCOP, CPP Hematology/Oncology Clinical Pharmacist Practitioner [facility] [phone]  01/07/2021 11:50 AM

## 2021-01-07 NOTE — Telephone Encounter (Signed)
Oral Chemotherapy Pharmacist Encounter  Patient Education I spoke with patient's wife Todd Villa for overview of new oral chemotherapy medication: Temodar (temozolomide) for the maintenance treatment of glioblastoma multiforme, planned duration until disease progression or unacceptable drug toxicity.  Counseled [name] on administration, dosing, side effects, monitoring, drug-food interactions, safe handling, storage, and disposal. Patient will take 4 capsules (400 mg total) by mouth daily. May take on an empty stomach to decrease nausea & vomiting.  Side effects include but not limited to: N/V, decreased wbc/plt/hgb, fatigue, constipation.    Nausea: patient has ondansteron on hand  Reviewed with Todd Villa importance of keeping a medication schedule and plan for any missed doses.  After discussion with Todd Villa no patient barriers to medication adherence identified.   [name] voiced understanding and appreciation. All questions answered. Medication handout provided.  Todd Villa knows Todd Villa temozolomide prescription was sent to [facility], the same pharmacy he has to use previously for his medication during XRT.   Provided [name] with Oral Chemotherapy Navigation Clinic phone number. Todd Villa knows to call the office with questions or concerns. Oral Chemotherapy Navigation Clinic will continue to follow.  Darl Pikes, PharmD, BCPS, BCOP, CPP Hematology/Oncology Clinical Pharmacist Practitioner [facility] [place] [facility] [phone]  01/07/2021 12:05 PM

## 2021-01-07 NOTE — Progress Notes (Signed)
[facility], [place] [address] [phone]   Interval Evaluation  Date of Service: 01/07/21 Patient Name: Todd Villa Patient MRN: [id number] Patient DOB: 31-May-1971 Provider: Ventura Sellers, MD  Identifying Statement:  SALIH WILLIAMSON is a 50 y.o. male with right temporal glioblastoma   Oncologic History: Oncology History  Glioblastoma multiforme of brain [facility])  08/19/2020 Surgery   Craniotomy, resection of right temporal mass with Dr. Lacinda Axon; path demonstrates Glioblastoma IDH-1 wild type   09/16/2020 Surgery   Wound flap washout with Dr. Lacinda Axon; collection speciates proteus.  PICC line placed for cefepime therapy.   10/25/2020 - 12/03/2020 Radiation Therapy   IMRT and concurrent Temodar 76m/m2   01/07/2021 -  Chemotherapy    Patient is on Treatment Plan: BRAIN GLIOBLASTOMA CONSOLIDATION TEMOZOLOMIDE DAYS 1-5 Q28 DAYS          Biomarkers:  MGMT Unknown.  IDH 1/2 Wild type.  EGFR Unknown  TERT Unknown   Interval History:  TSINAN TUCHpresents today for follow up after recent MRI brain, now having completed radiation and Temodar.  Overall he feels significantly improved since starting the steroids.  He feels more awake, alert, and his left leg is stronger.  He is walking around the home with cane assist.  Continues to experience dense left sided weakness with the arm.  He continues to work part time, still using phone and laptop for that at home.  Experienced headache following MRI scan which resolved.    H+P (09/24/20) Patient presents to clinic today for clinical review and treatment planning following repeat craniotomy last week.  During rehabilitation stay [facility] developed soft tissue infection, [facility]ani[facility] and washout.  He was initially placed on IV antibiotics (cefepime), but was transitioned to ciprofloxacin upon discharge, which he is  taking currently.  He continues to complain of dense left sided weakness, modestly improved from prior.  He has been back working (from home) mostly using telephone and computer.  Does complain of mild fatigue and has been napping daily.      Decadron 12/17/20: 470m  Medications: Current Outpatient Medications on File Prior to Visit  Medication Sig Dispense Refill   Acetaminophen 500 MG capsule Take 1,000 mg by mouth 2 (two) times daily.     apixaban (ELIQUIS) 5 MG TABS tablet Take 1 tablet (5 mg total) by mouth 2 (two) times daily. 60 tablet 3   dexamethasone (DECADRON) 4 MG tablet Take 1 tablet (4 mg total) by mouth daily. 30 tablet 3   FLUoxetine (PROZAC) 20 MG capsule Take 20 mg by mouth daily.     levETIRAcetam (KEPPRA) 750 MG tablet Take 2 tablets (1,500 mg total) by mouth every 12 (twelve) hours. 120 tablet 3   atorvastatin (LIPITOR) 10 MG tablet Take 10 mg by mouth daily. (Patient not taking: Reported on 01/07/2021)     pantoprazole (PROTONIX) 40 MG tablet Take 40 mg by mouth daily. (Patient not taking: Reported on 01/07/2021)     No current facility-administered medications on file prior to visit.    Allergies:  Allergies  Allergen Reactions   Nuedexta [Dextromethorphan-Quinidine]     Psychosis    Penicillins Other (See Comments)    Childhood reaction - patient is unsure of what happened.   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   Past Surgical History:  Past Surgical History:  Procedure Laterality Date   IR IVC FILTER PLMT / S&I /IMG GUID/MOD SED  08/29/2020   ROTATOR CUFF REPAIR Left 2015   Social History:  Social History   Socioeconomic History   Marital status: Married    Spouse name: Not on file   Number of children: Not on file   Years of education: Not on file   Highest education level: Not on file  Occupational History   Not on file  Tobacco  Use   Smoking status: Every Day    Packs/day: 0.66    Years: 30.00    Pack years: 19.80    Types: Cigarettes   Smokeless tobacco: Never  Substance and Sexual Activity   Alcohol use: No    Alcohol/week: 0.0 standard drinks   Drug use: No   Sexual activity: Yes  Other Topics Concern   Not on file  Social History Narrative   Not on file   Social Determinants of Health   Financial Resource Strain: Not on file  Food Insecurity: Not on file  Transportation Needs: Not on file  Physical Activity: Not on file  Stress: Not on file  Social Connections: Not on file  Intimate Partner Violence: Not on file   Family History:  Family History  Problem Relation Age of Onset   Hyperlipidemia Mother    Heart disease Mother    Hypertension Mother    Diabetes Mother    Alcohol abuse Father    Heart disease Father    Hypertension Father    Diabetes Sister    Cancer Neg Hx    Stroke Neg Hx     Review of Systems: Constitutional: Doesn't report fevers, chills or abnormal weight loss Eyes: Doesn't report blurriness of vision Ears, nose, mouth, throat, and face: Doesn't report sore throat Respiratory: Doesn't report cough, dyspnea or wheezes Cardiovascular: Doesn't report palpitation, chest discomfort  Gastrointestinal:  Doesn't report nausea, constipation, diarrhea GU: Doesn't report incontinence Skin: Doesn't report skin rashes Neurological: Per HPI Musculoskeletal: Doesn't report joint pain Behavioral/Psych: Doesn't report anxiety  Physical Exam: Vitals:   01/07/21 1024  BP: (!) 137/95  Pulse: 66  Resp: 18  Temp: 97.9 F (36.6 C)  SpO2: 97%    KPS: 60. General: Alert, cooperative, pleasant, in no acute distress Head: Craniectomy defect EENT: No conjunctival injection or scleral icterus.  Lungs: Resp effort normal Cardiac: Regular rate Abdomen: Non-distended abdomen Skin: No rashes cyanosis or petechiae. Extremities: No clubbing or edema  Neurologic Exam: Mental  Status: Awake, alert, attentive to examiner. Oriented to self and environment. Language is fluent with intact comprehension.  Cranial Nerves: Visual acuity is grossly normal. Visual fields are full. Extra-ocular movements intact. No ptosis. Face is symmetric Motor: Tone and bulk are normal. Left arm is 4/5 proximally, 2/5 distally in hand, left leg is 4/5. Reflexes are spastic, no pathologic reflexes present.  Sensory: Intact to light touch Gait: Deferred   Labs: I have reviewed the data as listed    Component Value Date/Time   NA 141 12/03/2020 0927   NA 137 07/14/2013 1031   K 3.5 12/03/2020 0927   K 3.8 07/14/2013 1031   CL 105 12/03/2020 0927   CL 107 07/14/2013 1031   CO2 27 12/03/2020 0927   CO2 25 07/14/2013 1031   GLUCOSE 127 (H) 12/03/2020 0927   GLUCOSE 125 (H) 07/14/2013 1031   BUN 16  12/03/2020 0927   BUN 9 07/14/2013 1031   CREATININE 0.73 12/03/2020 0927   CREATININE 0.98 07/14/2013 1031   CALCIUM 8.9 12/03/2020 0927   CALCIUM 8.7 07/14/2013 1031   PROT 6.4 (L) 12/03/2020 0927   ALBUMIN 3.5 12/03/2020 0927   AST 15 12/03/2020 0927   ALT 21 12/03/2020 0927   ALKPHOS 41 12/03/2020 0927   BILITOT 0.7 12/03/2020 0927   GFRNONAA >60 12/03/2020 0927   GFRNONAA >60 07/14/2013 1031   GFRAA >60 07/14/2013 1031   Lab Results  Component Value Date   WBC 10.6 (H) 12/03/2020   NEUTROABS 8.0 (H) 12/03/2020   HGB 14.3 12/03/2020   HCT 43.0 12/03/2020   MCV 91.1 12/03/2020   PLT 238 12/03/2020   Imaging:  [place] Clinician Interpretation: I have personally reviewed the CNS images as listed.  My interpretation, in the context of the patient's clinical presentation, is treatment effect vs true progression  MR BRAIN W WO CONTRAST  Result Date: 01/06/2021 CLINICAL DATA:  Brain/CNS neoplasm, assess treatment response. Brain tumor removed by craniectomy follow-up EXAM: MRI HEAD WITHOUT AND WITH CONTRAST TECHNIQUE: Multiplanar, multiecho pulse sequences of the brain and  surrounding structures were obtained without and with intravenous contrast. CONTRAST:  17m GADAVIST GADOBUTROL 1 MMOL/ML IV SOLN COMPARISON:  10/12/2020 FINDINGS: Brain: Resection cavity in the right frontal and parietal lobes with a large amount of surrounding hyperintense T2-weighted signal that has worsened since the prior study. There are multiple areas of markedly worsened contrast enhancement. Anterior to the resection cavity is an area that measures 2.7 x 1.4 cm. At the inferior posterior aspect of the cavity is an area of enhancement measuring 3.1 x 1.8 cm. There are multiple other, smaller areas of enhancement in the surrounding parenchyma. There is persistent herniation of brain through the craniectomy defect. No midline shift. There are old right cerebellar infarcts and mild multifocal white hyperintensity. Wallerian degeneration in the right corticospinal tract. Vascular: Major flow voids are preserved. Skull and upper cervical spine: Right parietal craniectomy. Sinuses/Orbits:No paranasal sinus fluid levels or advanced mucosal thickening. No mastoid or middle ear effusion. Normal orbits. IMPRESSION: Right parietal tumor progression with multiple new areas of nodular contrast enhancement and worsened surrounding hyperintense T2-weighted signal. Electronically Signed   By: KUlyses JarredM.D.   On: 01/06/2021 22:30    Assessment/Plan [place]li[place]e [place] brain ([place] [C71.9]  TJOSEPHANTHONY TINDELis clinically stable today, now having completed 6 weeks of IMRT and concurrent Temozolomide.  He experienced a clinical decline earlier this month, from which he returned to baseline following initiation of decadron 439mdaily.    MRI demonstrates progressive changes with the high dose treatment field consistent with either post-RT inflammation, or organic progression of disease.    We recommended initiating treatment with Temozolomide 15030m2, on for five days and off for twenty three days in twenty  eight day cycles. The patient will have a complete blood count performed on days 21 and 28 of each cycle, and a comprehensive metabolic panel performed on day 28 of each cycle. Labs may need to be performed more often. Zofran will prescribed for home use for nausea/vomiting.   Informed consent was obtained verbally at bedside to proceed with oral chemotherapy.  Chemotherapy should be held for the following:  ANC less than 1,000  Platelets less than 100,000  LFT or creatinine greater than 2x ULN  If clinical concerns/contraindications develop  Decadron can decrease to 3mg12mily if tolerated, once he is complete with week  of chemo.   Should continue Keppra 1025m BID, Eliquis 52mBID.  Not good candidate for optune because of craniectomy defect.   We ask that TiKONA LOVEReturn to clinic in 1 months prior to cycle #2 with labs for evaluation, or sooner as needed.  All questions were answered. The patient knows to call the clinic with any problems, questions or concerns. No barriers to learning were detected.  The total time spent in the encounter was 40 minutes and more than 50% was on counseling and review of test results   ZaVentura SellersMD Medical Director of Neuro-Oncology [facility] WeHolden7/29/22 11:33 AM

## 2021-01-10 ENCOUNTER — Inpatient Hospital Stay: Payer: Commercial Managed Care - PPO | Attending: Internal Medicine

## 2021-01-27 ENCOUNTER — Other Ambulatory Visit: Payer: Self-pay | Admitting: Internal Medicine

## 2021-01-27 DIAGNOSIS — C719 Malignant neoplasm of brain, unspecified: Secondary | ICD-10-CM

## 2021-02-04 ENCOUNTER — Inpatient Hospital Stay (HOSPITAL_BASED_OUTPATIENT_CLINIC_OR_DEPARTMENT_OTHER): Payer: Commercial Managed Care - PPO | Admitting: Internal Medicine

## 2021-02-04 ENCOUNTER — Inpatient Hospital Stay: Payer: Commercial Managed Care - PPO | Attending: Internal Medicine

## 2021-02-04 ENCOUNTER — Encounter: Payer: Self-pay | Admitting: Internal Medicine

## 2021-02-04 ENCOUNTER — Other Ambulatory Visit: Payer: Self-pay | Admitting: Pharmacist

## 2021-02-04 VITALS — BP 116/85 | HR 75 | Temp 98.6°F | Resp 20 | Wt 300.1 lb

## 2021-02-04 DIAGNOSIS — F17210 Nicotine dependence, cigarettes, uncomplicated: Secondary | ICD-10-CM | POA: Diagnosis not present

## 2021-02-04 DIAGNOSIS — Z7901 Long term (current) use of anticoagulants: Secondary | ICD-10-CM | POA: Diagnosis not present

## 2021-02-04 DIAGNOSIS — R630 Anorexia: Secondary | ICD-10-CM | POA: Diagnosis not present

## 2021-02-04 DIAGNOSIS — C719 Malignant neoplasm of brain, unspecified: Secondary | ICD-10-CM

## 2021-02-04 DIAGNOSIS — I10 Essential (primary) hypertension: Secondary | ICD-10-CM | POA: Diagnosis not present

## 2021-02-04 DIAGNOSIS — Z79899 Other long term (current) drug therapy: Secondary | ICD-10-CM | POA: Diagnosis not present

## 2021-02-04 DIAGNOSIS — R569 Unspecified convulsions: Secondary | ICD-10-CM | POA: Diagnosis not present

## 2021-02-04 DIAGNOSIS — C712 Malignant neoplasm of temporal lobe: Secondary | ICD-10-CM | POA: Insufficient documentation

## 2021-02-04 DIAGNOSIS — R5383 Other fatigue: Secondary | ICD-10-CM | POA: Diagnosis not present

## 2021-02-04 DIAGNOSIS — R531 Weakness: Secondary | ICD-10-CM | POA: Diagnosis not present

## 2021-02-04 DIAGNOSIS — R112 Nausea with vomiting, unspecified: Secondary | ICD-10-CM | POA: Insufficient documentation

## 2021-02-04 DIAGNOSIS — Z923 Personal history of irradiation: Secondary | ICD-10-CM | POA: Insufficient documentation

## 2021-02-04 DIAGNOSIS — Z8673 Personal history of transient ischemic attack (TIA), and cerebral infarction without residual deficits: Secondary | ICD-10-CM | POA: Insufficient documentation

## 2021-02-04 DIAGNOSIS — Z7952 Long term (current) use of systemic steroids: Secondary | ICD-10-CM | POA: Diagnosis not present

## 2021-02-04 LAB — COMPREHENSIVE METABOLIC PANEL
ALT: 22 U/L (ref 0–44)
AST: 16 U/L (ref 15–41)
Albumin: 3.3 g/dL — ABNORMAL LOW (ref 3.5–5.0)
Alkaline Phosphatase: 33 U/L — ABNORMAL LOW (ref 38–126)
Anion gap: 9 (ref 5–15)
BUN: 21 mg/dL — ABNORMAL HIGH (ref 6–20)
CO2: 27 mmol/L (ref 22–32)
Calcium: 8.6 mg/dL — ABNORMAL LOW (ref 8.9–10.3)
Chloride: 104 mmol/L (ref 98–111)
Creatinine, Ser: 0.94 mg/dL (ref 0.61–1.24)
GFR, Estimated: >60 mL/min (ref >60)
Glucose, Bld: 114 mg/dL — ABNORMAL HIGH (ref 70–99)
Potassium: 3.7 mmol/L (ref 3.5–5.1)
Sodium: 140 mmol/L (ref 135–145)
Total Bilirubin: 0.6 mg/dL (ref 0.3–1.2)
Total Protein: 6.4 g/dL — ABNORMAL LOW (ref 6.5–8.1)

## 2021-02-04 LAB — CBC WITH DIFFERENTIAL/PLATELET
Abs Immature Granulocytes: 0.11 10*3/uL — ABNORMAL HIGH (ref 0.00–0.07)
Basophils Absolute: 0 10*3/uL (ref 0.0–0.1)
Basophils Relative: 0 %
Eosinophils Absolute: 0 10*3/uL (ref 0.0–0.5)
Eosinophils Relative: 0 %
HCT: 40.9 % (ref 39.0–52.0)
Hemoglobin: 13.7 g/dL (ref 13.0–17.0)
Immature Granulocytes: 1 %
Lymphocytes Relative: 10 %
Lymphs Abs: 1 10*3/uL (ref 0.7–4.0)
MCH: 30.7 pg (ref 26.0–34.0)
MCHC: 33.5 g/dL (ref 30.0–36.0)
MCV: 91.7 fL (ref 80.0–100.0)
Monocytes Absolute: 1.3 10*3/uL — ABNORMAL HIGH (ref 0.1–1.0)
Monocytes Relative: 12 %
Neutro Abs: 8 10*3/uL — ABNORMAL HIGH (ref 1.7–7.7)
Neutrophils Relative %: 77 %
Platelets: 214 10*3/uL (ref 150–400)
RBC: 4.46 MIL/uL (ref 4.22–5.81)
RDW: 17.1 % — ABNORMAL HIGH (ref 11.5–15.5)
WBC: 10.5 10*3/uL (ref 4.0–10.5)
nRBC: 0 % (ref 0.0–0.2)

## 2021-02-04 MED ORDER — TEMOZOLOMIDE 180 MG PO CAPS
180.0000 mg | ORAL_CAPSULE | Freq: Every day | ORAL | 0 refills | Status: DC
Start: 2021-02-04 — End: 2021-04-11

## 2021-02-04 MED ORDER — TEMOZOLOMIDE 250 MG PO CAPS: 250.0000 mg | ORAL_CAPSULE | Freq: Every day | ORAL | 0 refills | Status: DC

## 2021-02-04 NOTE — Progress Notes (Signed)
[facility], [place] [address] [phone]   Interval Evaluation  Date of Service: 02/04/21 Patient Name: Todd Villa Patient MRN: [id number] Patient DOB: [birth year] Provider: Ventura Sellers, MD  Identifying Statement:  Todd Villa is a 50 y.o. male with right temporal glioblastoma   Oncologic History: Oncology History  [facility]iforme [facility])  08/19/2020 Surgery   Craniotomy, resection of right temporal mass with Dr. Lacinda Axon; path demonstrates Glioblastoma IDH-1 wild type   09/16/2020 Surgery   Wound flap washout with Dr. Lacinda Axon; collection speciates proteus.  PICC line placed for cefepime therapy.   10/25/2020 - 12/03/2020 Radiation Therapy   IMRT and concurrent Temodar 56m/m2   01/07/2021 -  Chemotherapy    Patient is on Treatment Plan: BRAIN GLIOBLASTOMA CONSOLIDATION TEMOZOLOMIDE DAYS 1-5 Q28 DAYS          Biomarkers:  MGMT Unknown.  IDH 1/2 Wild type.  EGFR Unknown  TERT Unknown   Interval History:  Todd SWAGERpresents today for follow up after recent MRI brain, now having completed first cycle of 5-day Temodar.  Overall he feels more fatigue than last week, especially during week of treatment.  He is walking around the home with cane assist.  Continues to experience dense left sided weakness with the arm.  He continues to work part time, still using phone and laptop for that at home.  Stopped decadron on 8/21.   H+P (09/24/20) Patient presents to clinic today for clinical review and treatment planning following repeat craniotomy last week.  During rehabilitation stay he developed soft tissue infection[facility]inda Axonwho performed craniotomy and washout.  He was initially placed on IV antibiotics (cefepime), but was transitioned to ciprofloxacin upon discharge, which he is taking currently.  He continues to complain of dense left sided  weakness, modestly improved from prior.  He has been back working (from home) mostly using telephone and computer.  Does complain of mild fatigue and has been napping daily.      Decadron 12/17/20: 430m  Medications: Current Outpatient Medications on File Prior to Visit  Medication Sig Dispense Refill   Acetaminophen 500 MG capsule Take 1,000 mg by mouth 2 (two) times daily.     apixaban (ELIQUIS) 5 MG TABS tablet Take 1 tablet (5 mg total) by mouth 2 (two) times daily. 60 tablet 3   atorvastatin (LIPITOR) 10 MG tablet Take 10 mg by mouth daily. (Patient not taking: Reported on 01/07/2021)     dexamethasone (DECADRON) 1 MG tablet Take 3 tablets (3 mg total) by mouth daily. 90 tablet 1   FLUoxetine (PROZAC) 20 MG capsule Take 20 mg by mouth daily.     levETIRAcetam (KEPPRA) 750 MG tablet Take 2 tablets (1,500 mg total) by mouth every 12 (twelve) hours. 120 tablet 3   ondansetron (ZOFRAN) 8 MG tablet Take 1 tablet (8 mg total) by mouth 2 (two) times daily as needed (nausea and vomiting). May take 30-60 minutes prior to Temodar administration if nausea/vomiting occurs. 30 tablet 1   pantoprazole (PROTONIX) 40 MG tablet Take 40 mg by mouth daily. (Patient not taking: Reported on 01/07/2021)     temozolomide (TEMODAR) 100 MG capsule Take 4 capsules (400 mg total) by mouth daily. May take on an empty stomach to decrease nausea & vomiting. 20 capsule 0   No current facility-administered medications on file prior to visit.  Allergies:  Allergies  Allergen Reactions   Nuedexta [Dextromethorphan-Quinidine]     Psychosis    Penicillins Other (See Comments)    Childhood reaction - patient is unsure of what happened.   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   Past Surgical History:  Past Surgical History:  Procedure Laterality Date   IR IVC FILTER PLMT / S&I /IMG GUID/MOD SED   08/29/2020   ROTATOR CUFF REPAIR Left 2015   Social History:  Social History   Socioeconomic History   Marital status: Married    Spouse name: Not on file   Number of children: Not on file   Years of education: Not on file   Highest education level: Not on file  Occupational History   Not on file  Tobacco Use   Smoking status: Every Day    Packs/day: 0.66    Years: 30.00    Pack years: 19.80    Types: Cigarettes   Smokeless tobacco: Never  Substance and Sexual Activity   Alcohol use: No    Alcohol/week: 0.0 standard drinks   Drug use: No   Sexual activity: Yes  Other Topics Concern   Not on file  Social History Narrative   Not on file   Social Determinants of Health   Financial Resource Strain: Not on file  Food Insecurity: Not on file  Transportation Needs: Not on file  Physical Activity: Not on file  Stress: Not on file  Social Connections: Not on file  Intimate Partner Violence: Not on file   Family History:  Family History  Problem Relation Age of Onset   Hyperlipidemia Mother    Heart disease Mother    Hypertension Mother    Diabetes Mother    Alcohol abuse Father    Heart disease Father    Hypertension Father    Diabetes Sister    Cancer Neg Hx    Stroke Neg Hx     Review of Systems: Constitutional: Doesn't report fevers, chills or abnormal weight loss Eyes: Doesn't report blurriness of vision Ears, nose, mouth, throat, and face: Doesn't report sore throat Respiratory: Doesn't report cough, dyspnea or wheezes Cardiovascular: Doesn't report palpitation, chest discomfort  Gastrointestinal:  Doesn't report nausea, constipation, diarrhea GU: Doesn't report incontinence Skin: Doesn't report skin rashes Neurological: Per HPI Musculoskeletal: Doesn't report joint pain Behavioral/Psych: Doesn't report anxiety  Physical Exam: Vitals:   02/04/21 1140  BP: 116/85  Pulse: 75  Resp: 20  Temp: 98.6 F (37 C)  SpO2: 95%   KPS: 60. General: Alert,  cooperative, pleasant, in no acute distress Head: Craniectomy defect EENT: No conjunctival injection or scleral icterus.  Lungs: Resp effort normal Cardiac: Regular rate Abdomen: Non-distended abdomen Skin: No rashes cyanosis or petechiae. Extremities: No clubbing or edema  Neurologic Exam: Mental Status: Awake, alert, attentive to examiner. Oriented to self and environment. Language is fluent with intact comprehension.  Cranial Nerves: Visual acuity is grossly normal. Visual fields are full. Extra-ocular movements intact. No ptosis. Face is symmetric Motor: Tone and bulk are normal. Left arm is 4/5 proximally, 2/5 distally in hand, left leg is 4/5. Reflexes are spastic, no pathologic reflexes present.  Sensory: Intact to light touch Gait: Deferred   Labs: I have reviewed the data as listed    Component Value Date/Time   NA 141 12/03/2020 0927   NA 137  07/14/2013 1031   K 3.5 12/03/2020 0927   K 3.8 07/14/2013 1031   CL 105 12/03/2020 0927   CL 107 07/14/2013 1031   CO2 27 12/03/2020 0927   CO2 25 07/14/2013 1031   GLUCOSE 127 (H) 12/03/2020 0927   GLUCOSE 125 (H) 07/14/2013 1031   BUN 16 12/03/2020 0927   BUN 9 07/14/2013 1031   CREATININE 0.73 12/03/2020 0927   CREATININE 0.98 07/14/2013 1031   CALCIUM 8.9 12/03/2020 0927   CALCIUM 8.7 07/14/2013 1031   PROT 6.4 (L) 12/03/2020 0927   ALBUMIN 3.5 12/03/2020 0927   AST 15 12/03/2020 0927   ALT 21 12/03/2020 0927   ALKPHOS 41 12/03/2020 0927   BILITOT 0.7 12/03/2020 0927   GFRNONAA >60 12/03/2020 0927   GFRNONAA >60 07/14/2013 1031   GFRAA >60 07/14/2013 1031   Lab Results  Component Value Date   WBC 10.6 (H) 12/03/2020   NEUTROABS 8.0 (H) 12/03/2020   HGB 14.3 12/03/2020   HCT 43.0 12/03/2020   MCV 91.1 12/03/2020   PLT 238 12/03/2020    Assessment/Plan Glioblastoma multiforme of brain ([place]) [C71.9]  Todd Villa is clinically stable today, now having completed first cycle of adjuvant Temozolomide.     We recommended continuing treatment with Temozolomide, dose increased to 257m/m2, on for five days and off for twenty three days in twenty eight day cycles. The patient will have a complete blood count performed on days 21 and 28 of each cycle, and a comprehensive metabolic panel performed on day 28 of each cycle. Labs may need to be performed more often. Zofran will prescribed for home use for nausea/vomiting.   Chemotherapy should be held for the following:  ANC less than 1,000  Platelets less than 100,000  LFT or creatinine greater than 2x ULN  If clinical concerns/contraindications develop  Decadron can restarted at 0.588mdue to excessive fatigue, poor appetite.  Should continue Keppra 150023mID, Eliquis 5mg66mD.  We ask that Todd ANAYAurn to clinic in 1 months prior to cycle #3 with MRI brain evaluation, or sooner as needed.  All questions were answered. The patient knows to call the clinic with any problems, questions or concerns. No barriers to learning were detected.  The total time spent in the encounter was 30 minutes and more than 50% was on counseling and review of test results   ZachVentura Sellers Medical Director of Neuro-Oncology [facility] FlintWeslJump River26/22 11:23 AM

## 2021-02-04 NOTE — Progress Notes (Signed)
Pts wife states atorvastatin was prescribed at the hospital, not sure if they still need to take it or not.

## 2021-02-10 ENCOUNTER — Encounter: Payer: Self-pay | Admitting: Internal Medicine

## 2021-02-20 ENCOUNTER — Other Ambulatory Visit: Payer: Self-pay | Admitting: Internal Medicine

## 2021-02-21 ENCOUNTER — Encounter: Payer: Self-pay | Admitting: Internal Medicine

## 2021-02-22 ENCOUNTER — Telehealth: Payer: Self-pay | Admitting: Student

## 2021-02-22 ENCOUNTER — Other Ambulatory Visit: Payer: Self-pay | Admitting: Radiation Therapy

## 2021-02-22 ENCOUNTER — Inpatient Hospital Stay: Payer: Commercial Managed Care - PPO | Attending: Internal Medicine | Admitting: Hospice and Palliative Medicine

## 2021-02-22 VITALS — BP 135/96 | HR 86 | Temp 98.0°F | Resp 18 | Wt 301.0 lb

## 2021-02-22 DIAGNOSIS — Z923 Personal history of irradiation: Secondary | ICD-10-CM | POA: Diagnosis not present

## 2021-02-22 DIAGNOSIS — Z9221 Personal history of antineoplastic chemotherapy: Secondary | ICD-10-CM | POA: Diagnosis not present

## 2021-02-22 DIAGNOSIS — Z7952 Long term (current) use of systemic steroids: Secondary | ICD-10-CM | POA: Diagnosis not present

## 2021-02-22 DIAGNOSIS — C712 Malignant neoplasm of temporal lobe: Secondary | ICD-10-CM | POA: Diagnosis present

## 2021-02-22 DIAGNOSIS — Z79899 Other long term (current) drug therapy: Secondary | ICD-10-CM | POA: Insufficient documentation

## 2021-02-22 DIAGNOSIS — C719 Malignant neoplasm of brain, unspecified: Secondary | ICD-10-CM | POA: Diagnosis not present

## 2021-02-22 DIAGNOSIS — Z7901 Long term (current) use of anticoagulants: Secondary | ICD-10-CM | POA: Insufficient documentation

## 2021-02-22 DIAGNOSIS — Z8673 Personal history of transient ischemic attack (TIA), and cerebral infarction without residual deficits: Secondary | ICD-10-CM | POA: Insufficient documentation

## 2021-02-22 NOTE — Progress Notes (Signed)
Todd Villa  [phone] [phone]   Name: Todd Villa Date: 02/22/2021 MRN: [id number]  DOB: [birth year]  Patient Care Team: Donnamarie Rossetti, PA-C as PCP - General (Family Medicine)   REASON FOR CONSULTATION: Todd Villa is a 50 y.o. male with multiple medical problems including glioblastoma multiforme.  Patient was initially thought to have right basal ganglia CVA in January 2022 leaving him with significant left-sided weakness.  MRI of the brain ultimately revealed a large heterogeneous mass in the right frontal parietal region extending into the right basal ganglia with significant surrounding vasogenic edema concerning for high-grade neoplasm.  Patient was transferred to [facility] and underwent craniotomy with resection of right temporal mass on 08/19/2020.  This was followed by adjuvant radiation (completed 12/03/2020) and concurrent chemotherapy with Temodar.  Palliative care was consulted to progress goals and manage ongoing symptoms.  SOCIAL HISTORY:     reports that he quit smoking about 8 months ago. His smoking use included cigarettes. He has a 19.80 pack-year smoking history. He has never used smokeless tobacco. He reports that he does not drink alcohol and does not use drugs.  Patient lives at home with his wife and children.  He has three teenage children.  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 Lacinda Axon; path demonstrates Glioblastoma IDH-1 wild type   09/16/2020 Surgery   Wound flap washout with Dr. Lacinda Axon; collection speciates proteus.  PICC line placed for cefepime therapy.   10/25/2020 - 12/03/2020 Radiation Therapy   IMRT and concurrent Temodar 55m/m2   01/07/2021 -  Chemotherapy    Patient is on Treatment Plan: BRAIN GLIOBLASTOMA CONSOLIDATION TEMOZOLOMIDE DAYS 1-5 Q28 DAYS          ALLERGIES:  is allergic to nuedexta [dextromethorphan-quinidine] and penicillins.  MEDICATIONS:  Current Outpatient Medications  Medication Sig Dispense Refill   Acetaminophen 500 MG capsule Take 1,000 mg by mouth 2 (two) times daily.     apixaban (ELIQUIS) 5 MG TABS tablet Take 1 tablet (5 mg total) by mouth 2 (two) times daily. 60 tablet 3   atorvastatin (LIPITOR) 10 MG tablet Take 10 mg by mouth daily. (Patient not taking: Reported on 01/07/2021)     dexamethasone (DECADRON) 1 MG tablet TAKE 3 TABLETS(3 MG) BY MOUTH DAILY 90 tablet 1   FLUoxetine (PROZAC) 20 MG capsule Take 20 mg by mouth daily.     levETIRAcetam (KEPPRA) 750 MG tablet Take 2 tablets (1,500 mg total) by mouth every 12 (twelve) hours. 120 tablet 3   Multiple Vitamins-Minerals (MULTIVITAMIN ADULT, MINERALS, PO)      omeprazole (PRILOSEC) 40 MG capsule      ondansetron (ZOFRAN) 8 MG tablet Take 1 tablet (8 mg total) by mouth 2 (two) times daily as needed (nausea and vomiting). May take 30-60 minutes prior to Temodar  administration if nausea/vomiting occurs. 30 tablet 1   temozolomide (TEMODAR) 180 MG capsule Take 1 capsule (180 mg total) by mouth daily. May take on an empty stomach to decrease nausea & vomiting. 5 capsule 0   temozolomide (TEMODAR) 250 MG capsule Take 1 capsule (250 mg total) by mouth daily. May take on an empty stomach to decrease nausea & vomiting. 5 capsule 0   No current facility-administered medications for this visit.    VITAL  SIGNS: There were no vitals taken for this visit. There were no vitals filed for this visit.  Estimated body mass index is 37.51 kg/m as calculated from the following:   Height as of 11/05/20: _0  (1.905 m).   Weight as of 02/04/21: 300 lb 1.6 oz (136.1 kg).  LABS: CBC:    Component Value Date/Time   WBC 10.5 02/04/2021 1122   HGB 13.7 02/04/2021 1122   HGB 14.4 07/14/2013 1031   HCT 40.9 02/04/2021 1122   HCT 43.1 07/14/2013 1031   PLT 214 02/04/2021 1122   PLT 337 07/14/2013 1031   MCV 91.7 02/04/2021 1122   MCV 93 07/14/2013 1031   NEUTROABS 8.0 (H) 02/04/2021 1122   LYMPHSABS 1.0 02/04/2021 1122   MONOABS 1.3 (H) 02/04/2021 1122   EOSABS 0.0 02/04/2021 1122   BASOSABS 0.0 02/04/2021 1122   Comprehensive Metabolic Panel:    Component Value Date/Time   NA 140 02/04/2021 1122   NA 137 07/14/2013 1031   K 3.7 02/04/2021 1122   K 3.8 07/14/2013 1031   CL 104 02/04/2021 1122   CL 107 07/14/2013 1031   CO2 27 02/04/2021 1122   CO2 25 07/14/2013 1031   BUN 21 (H) 02/04/2021 1122   BUN 9 07/14/2013 1031   CREATININE 0.94 02/04/2021 1122   CREATININE 0.98 07/14/2013 1031   GLUCOSE 114 (H) 02/04/2021 1122   GLUCOSE 125 (H) 07/14/2013 1031   CALCIUM 8.6 (L) 02/04/2021 1122   CALCIUM 8.7 07/14/2013 1031   AST 16 02/04/2021 1122   ALT 22 02/04/2021 1122   ALKPHOS 33 (L) 02/04/2021 1122   BILITOT 0.6 02/04/2021 1122   PROT 6.4 (L) 02/04/2021 1122   ALBUMIN 3.3 (L) 02/04/2021 1122    RADIOGRAPHIC STUDIES: No results found.  PERFORMANCE STATUS (ECOG) : 2 - Symptomatic, <50% confined to bed  Review of Systems Unless otherwise noted, a complete review of systems is negative.  Physical Exam General: NAD Cardiovascular: regular rate and rhythm Pulmonary: clear ant fields Abdomen: soft, nontender, + bowel sounds GU: no suprapubic tenderness Extremities: no edema, no joint deformities Skin: no rashes Neurological: Left-sided weakness     IMPRESSION: I met with  patient and his wife today in clinic.  I introduced palliative care services and attempted to establish therapeutic rapport.  Patient reports that he is doing reasonably well and denied any significant symptomatic concerns today.    However, wife has been titrating dose of dexamethasone due to recent swelling at the site of patient's craniotomy.  Most recently, she has resumed dexamethasone 4 mg daily and says that swelling has improved over the past several days.  However, wife feels like patient is fatigued and has poor appetite.  Previously, both fatigue and appetite has improved on higher doses of steroids.  At baseline, patient lives at home with his wife and three teenage children.  Family are staying with patient around-the-clock.  Patient ambulates with use of a 4-pronged cane.  He relies on family support to help with bathing and  dressing.  Patient has had a couple of recent falls.  He is unable to utilize a walker due to left-sided weakness.  Will refer to [place], [place].  Weight has been relatively stable but his wife reports reduced oral intake recently.  Patient is eating 25 to 50% of meals.  He is drinking an oral supplement once daily and we discussed increasing the frequency to twice or 3 times daily.  Will refer to nutrition.  Patient was tearful as he discussed losing his independence and possible occupational layoff due to the recent sale of his employer.  He denies anxiety.  Patient is on fluoxetine 20 mg daily.  We discussed the option of increasing the dose of his SSRI but wife would like to continue current dosing for now.  We discussed home resources such as community palliative care and [facility]. Wife would be interested in referral to community palliative care.  Patient has completed ACP documents, which they will bring into the clinic to be scanned into the chart.  Wife is his healthcare power of attorney.  Patient also has a living will.  We discussed CODE STATUS and I sent  patient home with a MOST form to review.  PLAN: -Continue current prescription treatment -Agree with continued dexamethasone 4 mg daily -Referrals for nutrition, rehab screening, and home palliative care -MOST Form reviewed -Follow-up virtual visit in 3 to 4 months  Patient expressed understanding and was in agreement with this plan. He also understands that He can call the clinic at any time with any questions, concerns, or complaints.     Time Total: 30 minutes  Visit consisted of counseling and education dealing with the complex and emotionally intense issues of symptom management and palliative care in the setting of serious and potentially life-threatening illness.Greater than 50%  of this time was spent counseling and coordinating care related to the above assessment and plan.  Signed by: Altha Harm, PhD, NP-C

## 2021-02-22 NOTE — Telephone Encounter (Signed)
Attempted to contact patient's wife, Joelene Millin, to offer to schedule a Palliative Consult, no answer - left message with reason for call along with my name and call back number, requesting a return call to schedule visit.

## 2021-02-22 NOTE — Progress Notes (Signed)
Patient reports constipation, nausea, and left side numbness, tingling, weakness.

## 2021-02-25 ENCOUNTER — Other Ambulatory Visit: Payer: Self-pay | Admitting: Internal Medicine

## 2021-02-25 ENCOUNTER — Telehealth: Payer: Self-pay | Admitting: Student

## 2021-02-25 DIAGNOSIS — C719 Malignant neoplasm of brain, unspecified: Secondary | ICD-10-CM

## 2021-02-25 NOTE — Telephone Encounter (Signed)
Attempted to reach patient's wife to schedule Palliative Consult, no answer - left message requesting a return call to schedule visit and to also answer any questons they have regarding Palliative services

## 2021-02-28 ENCOUNTER — Telehealth: Payer: Self-pay | Admitting: Student

## 2021-02-28 NOTE — Telephone Encounter (Signed)
Rec'd return call from patient's wife, Joelene Millin, and we discussed the Palliative referral/services and all questions were answered and they were in agreement with scheduling visit.  I have scheduled an In-home Consult for 03/08/21 @ 9 AM

## 2021-03-02 ENCOUNTER — Telehealth: Payer: Self-pay | Admitting: *Deleted

## 2021-03-02 ENCOUNTER — Ambulatory Visit
Admission: RE | Admit: 2021-03-02 | Discharge: 2021-03-02 | Disposition: A | Discharge status: Home / Self Care | Payer: Commercial Managed Care - PPO | Source: Ambulatory Visit | Attending: Internal Medicine | Admitting: Internal Medicine

## 2021-03-02 ENCOUNTER — Other Ambulatory Visit: Payer: Self-pay

## 2021-03-02 DIAGNOSIS — C719 Malignant neoplasm of brain, unspecified: Secondary | ICD-10-CM | POA: Diagnosis not present

## 2021-03-02 MED ORDER — GADOBUTROL 1 MMOL/ML IV SOLN
10.0000 mL | Freq: Once | INTRAVENOUS | Status: AC | PRN
  Administered 2021-03-02: 10 mL via INTRAVENOUS

## 2021-03-02 NOTE — Telephone Encounter (Signed)
Received a call from Marlowe Kays- occupational therapy with [facility] asking for order approval for occupational therapy visits 2wk4, 1wk3. Please advise

## 2021-03-02 NOTE — Telephone Encounter (Signed)
VERBAL ORDER called to Marlowe Kays, had to leave it on her voice mail

## 2021-03-04 ENCOUNTER — Inpatient Hospital Stay: Payer: Commercial Managed Care - PPO

## 2021-03-04 ENCOUNTER — Inpatient Hospital Stay (HOSPITAL_BASED_OUTPATIENT_CLINIC_OR_DEPARTMENT_OTHER): Payer: Commercial Managed Care - PPO | Admitting: Internal Medicine

## 2021-03-04 ENCOUNTER — Other Ambulatory Visit: Payer: Self-pay

## 2021-03-04 ENCOUNTER — Encounter: Payer: Self-pay | Admitting: Internal Medicine

## 2021-03-04 VITALS — BP 133/90 | HR 102 | Temp 95.0°F | Resp 19 | Wt 300.9 lb

## 2021-03-04 DIAGNOSIS — C719 Malignant neoplasm of brain, unspecified: Secondary | ICD-10-CM

## 2021-03-04 DIAGNOSIS — C712 Malignant neoplasm of temporal lobe: Secondary | ICD-10-CM | POA: Diagnosis not present

## 2021-03-04 DIAGNOSIS — R569 Unspecified convulsions: Secondary | ICD-10-CM

## 2021-03-04 LAB — CBC WITH DIFFERENTIAL/PLATELET
Abs Immature Granulocytes: 0.28 10*3/uL — ABNORMAL HIGH (ref 0.00–0.07)
Basophils Absolute: 0 10*3/uL (ref 0.0–0.1)
Basophils Relative: 0 %
Eosinophils Absolute: 0 10*3/uL (ref 0.0–0.5)
Eosinophils Relative: 0 %
HCT: 39.5 % (ref 39.0–52.0)
Hemoglobin: 13.5 g/dL (ref 13.0–17.0)
Immature Granulocytes: 2 %
Lymphocytes Relative: 13 %
Lymphs Abs: 1.6 10*3/uL (ref 0.7–4.0)
MCH: 32.5 pg (ref 26.0–34.0)
MCHC: 34.2 g/dL (ref 30.0–36.0)
MCV: 95 fL (ref 80.0–100.0)
Monocytes Absolute: 1 10*3/uL (ref 0.1–1.0)
Monocytes Relative: 8 %
Neutro Abs: 9 10*3/uL — ABNORMAL HIGH (ref 1.7–7.7)
Neutrophils Relative %: 77 %
Platelets: 210 10*3/uL (ref 150–400)
RBC: 4.16 MIL/uL — ABNORMAL LOW (ref 4.22–5.81)
RDW: 15.8 % — ABNORMAL HIGH (ref 11.5–15.5)
WBC: 11.9 10*3/uL — ABNORMAL HIGH (ref 4.0–10.5)
nRBC: 0 % (ref 0.0–0.2)

## 2021-03-04 LAB — COMPREHENSIVE METABOLIC PANEL
ALT: 33 U/L (ref 0–44)
AST: 23 U/L (ref 15–41)
Albumin: 3.6 g/dL (ref 3.5–5.0)
Alkaline Phosphatase: 34 U/L — ABNORMAL LOW (ref 38–126)
Anion gap: 14 (ref 5–15)
BUN: 20 mg/dL (ref 6–20)
CO2: 24 mmol/L (ref 22–32)
Calcium: 8.7 mg/dL — ABNORMAL LOW (ref 8.9–10.3)
Chloride: 101 mmol/L (ref 98–111)
Creatinine, Ser: 0.87 mg/dL (ref 0.61–1.24)
GFR, Estimated: >60 mL/min (ref >60)
Glucose, Bld: 140 mg/dL — ABNORMAL HIGH (ref 70–99)
Potassium: 3.1 mmol/L — ABNORMAL LOW (ref 3.5–5.1)
Sodium: 139 mmol/L (ref 135–145)
Total Bilirubin: 0.6 mg/dL (ref 0.3–1.2)
Total Protein: 6.6 g/dL (ref 6.5–8.1)

## 2021-03-04 MED ORDER — TEMOZOLOMIDE 180 MG PO CAPS: 180.0000 mg | ORAL_CAPSULE | Freq: Every day | ORAL | 0 refills | Status: DC

## 2021-03-04 MED ORDER — TEMOZOLOMIDE 250 MG PO CAPS: 250.0000 mg | ORAL_CAPSULE | Freq: Every day | ORAL | 0 refills | Status: DC

## 2021-03-04 NOTE — Progress Notes (Signed)
Nutrition Assessment:  Referral from [place], NP for poor appetite  50 year old male with glioblastoma.  Past medical history of s/p craniotomy with resection of right temporal mass, adjuvant radiation,  HTN, stroke, seizures.  Patient on temodar.   Met with patient and wife following MD visit.  Patient reports poor appetite, eating about 50% of usual intake.  Reports that he usually eats banana and drinks boost max protein (160 calories, 30 g protein) for breakfast and may have eggs, [place] sausage that wife prepares before work.  Patient is usually at home during the day alone.  Snacks during the day then wife prepares evening meal of meat and vegetables.  Son took patient to lunch yesterday and had chicken breast with few fries, hushpuppies and slaw.  Patient reports that taste is off and has had some nausea (medications help)  Medications: zofran, MVI, decadron  Labs: K 3.1, glucose 140  Anthropometrics:   Weight 300 lb 14.4 oz today  301 lb on 9/13 327 lb on 08/17/20  8% weight loss in the last 6 months   NUTRITION DIAGNOSIS: Inadequate oral intake related to cancer and cancer related treatment side effects as evidenced by 8% weight loss in the last 6 months, nausea, eating 50% of typical intake   INTERVENTION:  Recommend switching to boost plus (360 calorie + ) shake for added calories. Discussed ways to increase calories in diet Discussed strategies to help with taste change.  Contact information provided    MONITORING, EVALUATION, GOAL: weight trends, intake   NEXT VISIT: Friday, Oct 21 after MD visit  Shalaine Payson B. Zenia Resides, [place], [place] Registered Dietitian [phone] (mobile)

## 2021-03-04 NOTE — Progress Notes (Signed)
[facility] [place], [place] [address] [phone]   Interval Evaluation  Date of Service: 03/04/21 Patient Name: Todd Villa Patient MRN: [id number] Patient DOB: 11/04/1970 Provider: Ventura Sellers, MD  Identifying Statement:  Todd Villa is a 50 y.o. male with right temporal glioblastoma   Oncologic History: Oncology History  Glioblastoma multiforme of brain [facility])  08/19/2020 Surgery   Craniotomy, resection of right temporal mass with Dr. Lacinda Axon; path demonstrates Glioblastoma IDH-1 wild type   09/16/2020 Surgery   Wound flap washout with Dr. Lacinda Axon; collection speciates proteus.  PICC line placed for cefepime therapy.   10/25/2020 - 12/03/2020 Radiation Therapy   IMRT and concurrent Temodar 21m/m2   01/07/2021 -  Chemotherapy    Patient is on Treatment Plan: BRAIN GLIOBLASTOMA CONSOLIDATION TEMOZOLOMIDE DAYS 1-5 Q28 DAYS          Biomarkers:  MGMT Unknown.  IDH 1/2 Wild type.  EGFR Unknown  TERT Unknown   Interval History:  Todd SPARLINGpresents today for follow up after recent MRI brain, now having completed cycle #2 of (dose escalated) 5-day Temodar.  Tolerated treatment well without nausea.  He did experience significant fatigue and generalized weakness this month, which has been improved dramatically with decadron 460mdaily.  He is still walking around the home with cane assist.  Continues to experience dense left sided weakness with the arm.    H+P (09/24/20) Patient presents to clinic today for clinical review and treatment planning following repeat craniotomy last week.  During rehabilitation stay he developed soft tissue infection, [facility]inda Axonho performed craniotomy and washout.  He was initially placed on IV antibiotics (cefepime), but was transitioned to ciprofloxacin upon discharge, which he is taking currently.  He continues to complain of dense left  sided weakness, modestly improved from prior.  He has been back working (from home) mostly using telephone and computer.  Does complain of mild fatigue and has been napping daily.      Decadron 12/17/20: 56m756m Medications: Current Outpatient Medications on File Prior to Visit  Medication Sig Dispense Refill   Acetaminophen 500 MG capsule Take 1,000 mg by mouth 2 (two) times daily.     apixaban (ELIQUIS) 5 MG TABS tablet Take 1 tablet (5 mg total) by mouth 2 (two) times daily. 60 tablet 3   dexamethasone (DECADRON) 1 MG tablet TAKE 3 TABLETS(3 MG) BY MOUTH DAILY 90 tablet 1   FLUoxetine (PROZAC) 20 MG capsule Take 20 mg by mouth daily.     levETIRAcetam (KEPPRA) 750 MG tablet Take 2 tablets (1,500 mg total) by mouth every 12 (twelve) hours. 120 tablet 3   Multiple Vitamins-Minerals (MULTIVITAMIN ADULT, MINERALS, PO)      omeprazole (PRILOSEC) 40 MG capsule      ondansetron (ZOFRAN) 8 MG tablet Take 1 tablet (8 mg total) by mouth 2 (two) times daily as needed (nausea and vomiting). May take 30-60 minutes prior to Temodar administration if nausea/vomiting occurs. 30 tablet 1   temozolomide (TEMODAR) 180 MG capsule Take 1 capsule (180 mg total) by mouth daily. May take on an empty stomach to decrease nausea & vomiting. (Patient not taking: No sig reported) 5 capsule 0   temozolomide (TEMODAR) 250 MG capsule Take 1 capsule (250 mg total) by mouth daily. May take on an empty stomach to decrease nausea & vomiting. (Patient not taking: No sig reported) 5 capsule  0   No current facility-administered medications on file prior to visit.    Allergies:  Allergies  Allergen Reactions   Nuedexta [Dextromethorphan-Quinidine]     Psychosis    Penicillins Other (See Comments)    Childhood reaction - patient is unsure of what happened.   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   Past  Surgical History:  Past Surgical History:  Procedure Laterality Date   IR IVC FILTER PLMT / S&I /IMG GUID/MOD SED  08/29/2020   ROTATOR CUFF REPAIR Left 2015   Social History:  Social History   Socioeconomic History   Marital status: Married    Spouse name: Not on file   Number of children: Not on file   Years of education: Not on file   Highest education level: Not on file  Occupational History   Not on file  Tobacco Use   Smoking status: Former    Packs/day: 0.66    Years: 30.00    Pack years: 19.80    Types: Cigarettes    Quit date: 06/18/2020    Years since quitting: 0.7   Smokeless tobacco: Never  Substance and Sexual Activity   Alcohol use: No    Alcohol/week: 0.0 standard drinks   Drug use: No   Sexual activity: Yes  Other Topics Concern   Not on file  Social History Narrative   Not on file   Social Determinants of Health   Financial Resource Strain: Not on file  Food Insecurity: Not on file  Transportation Needs: Not on file  Physical Activity: Not on file  Stress: Not on file  Social Connections: Not on file  Intimate Partner Violence: Not on file   Family History:  Family History  Problem Relation Age of Onset   Hyperlipidemia Mother    Heart disease Mother    Hypertension Mother    Diabetes Mother    Alcohol abuse Father    Heart disease Father    Hypertension Father    Diabetes Sister    Cancer Neg Hx    Stroke Neg Hx     Review of Systems: Constitutional: Doesn't report fevers, chills or abnormal weight loss Eyes: Doesn't report blurriness of vision Ears, nose, mouth, throat, and face: Doesn't report sore throat Respiratory: Doesn't report cough, dyspnea or wheezes Cardiovascular: Doesn't report palpitation, chest discomfort  Gastrointestinal:  Doesn't report nausea, constipation, diarrhea GU: Doesn't report incontinence Skin: Doesn't report skin rashes Neurological: Per HPI Musculoskeletal: Doesn't report joint pain Behavioral/Psych:  Doesn't report anxiety  Physical Exam: Vitals:   03/04/21 1006  BP: 133/90  Pulse: (!) 102  Resp: 19  Temp: (!) 95 F (35 C)  SpO2: 95%   KPS: 60. General: Alert, cooperative, pleasant, in no acute distress Head: Craniectomy defect EENT: No conjunctival injection or scleral icterus.  Lungs: Resp effort normal Cardiac: Regular rate Abdomen: Non-distended abdomen Skin: No rashes cyanosis or petechiae. Extremities: No clubbing or edema  Neurologic Exam: Mental Status: Awake, alert, attentive to examiner. Oriented to self and environment. Language is fluent with intact comprehension.  Cranial Nerves: Visual acuity is grossly normal. Visual fields are full. Extra-ocular movements intact. No ptosis. Face is symmetric Motor: Tone and bulk are normal. Left arm is 4/5 proximally, 2/5 distally in hand, left leg is 4/5. Reflexes are spastic, no pathologic reflexes present.  Sensory: Intact to light  touch Gait: Deferred   Labs: I have reviewed the data as listed    Component Value Date/Time   NA 139 03/04/2021 0928   NA 137 07/14/2013 1031   K 3.1 (L) 03/04/2021 0928   K 3.8 07/14/2013 1031   CL 101 03/04/2021 0928   CL 107 07/14/2013 1031   CO2 24 03/04/2021 0928   CO2 25 07/14/2013 1031   GLUCOSE 140 (H) 03/04/2021 0928   GLUCOSE 125 (H) 07/14/2013 1031   BUN 20 03/04/2021 0928   BUN 9 07/14/2013 1031   CREATININE 0.87 03/04/2021 0928   CREATININE 0.98 07/14/2013 1031   CALCIUM 8.7 (L) 03/04/2021 0928   CALCIUM 8.7 07/14/2013 1031   PROT 6.6 03/04/2021 0928   ALBUMIN 3.6 03/04/2021 0928   AST 23 03/04/2021 0928   ALT 33 03/04/2021 0928   ALKPHOS 34 (L) 03/04/2021 0928   BILITOT 0.6 03/04/2021 0928   GFRNONAA >60 03/04/2021 0928   GFRNONAA >60 07/14/2013 1031   GFRAA >60 07/14/2013 1031   Lab Results  Component Value Date   WBC 11.9 (H) 03/04/2021   NEUTROABS 9.0 (H) 03/04/2021   HGB 13.5 03/04/2021   HCT 39.5 03/04/2021   MCV 95.0 03/04/2021   PLT 210  03/04/2021   Imaging:  [place] Clinician Interpretation: I have personally reviewed the CNS images as listed.  My interpretation, in the context of the patient's clinical presentation, is treatment effect vs true progression  MR BRAIN W WO CONTRAST  Result Date: 03/03/2021 CLINICAL DATA:  Brain/CNS neoplasm, assess treatment response EXAM: MRI HEAD WITHOUT AND WITH CONTRAST TECHNIQUE: Multiplanar, multiecho pulse sequences of the brain and surrounding structures were obtained without and with intravenous contrast. CONTRAST:  6mL GADAVIST GADOBUTROL 1 MMOL/ML IV SOLN COMPARISON:  MRI 01/05/2021. FINDINGS: Brain: Redemonstrated resection cavity in the right frontal and parietal lobes with no substantial change in extensive surrounding T2 hyperintensity. Similar herniation of brain through the craniectomy defect. Similar wallerian degeneration involving the right midbrain. Similar sequela of prior hemorrhage. No midline shift. There are multiple areas of enhancement surrounding the resection cavity, some of which have increased in size/bulk. For example along the posterior aspect of the resection cavity there is an area of masslike enhancement that measures 3.7 x 2.3 cm on series 18, image 104 (previously 3.1 x 2.1 cm). Additional area of masslike enhancement along the superomedial aspect of the resection cavity measures 2.1 x 1.4 cm on series 18, image 124 (previously 1.6 x 1.0 cm). Along the anterior/inferior aspect of the resection cavity there is new small area of enhancement (series 18, image 98) with adjacent nodular area of enhancement that is increased in size (measuring 1.1 cm on series 18, image 102; previously 0.6 cm). No evidence of acute infarct, acute hemorrhage, hydrocephalus. Partially empty sella. Vascular: Major arterial flow voids are maintained at the skull base. Skull and upper cervical spine: Normal marrow signal. Sinuses/Orbits: Mild paranasal sinus mucosal thickening. No acute orbital  findings. Other: No sizable mastoid effusions. IMPRESSION: Interval increase in size/bulk of multiple areas of enhancing tumor surrounding the resection cavity with new small area of enhancement anteriorly, detailed above and concerning for tumor progression. Surrounding T2 hyperintense signal is similar. Electronically Signed   By: Margaretha Sheffield M.D.   On: 03/03/2021 12:40    Assessment/Plan Glioblastoma multiforme of brain ([place]) [C71.9]  JAVAR ESHBACH is clinically stable today, now having completed cycle #2 of adjuvant Temozolomide.  MRI brain demonstrates progression of disease; etiology is either tumor progression  or pseudo-progression, given timing since completion of radiation therapy.  Positive response to decadron is encouraging.    We recommended continuing treatment with an additional cycle (#3) Temozolomide 218m/m2, on for five days and off for twenty three days in twenty eight day cycles. The patient will have a complete blood count performed on days 21 and 28 of each cycle, and a comprehensive metabolic panel performed on day 28 of each cycle. Labs may need to be performed more often. Zofran will prescribed for home use for nausea/vomiting.   Chemotherapy should be held for the following:  ANC less than 1,000  Platelets less than 100,000  LFT or creatinine greater than 2x ULN  If clinical concerns/contraindications develop  Will repeat MRI brain in 1 month to better characterize progression vs pseudoprogression.  Decadron can be decreased to 384mor 46m51mnce chemotherapy dosing is completed this month.  Should continue Keppra 1500m85mD, Eliquis 5mg 28m.  We ask that Todd HARMESrn to clinic in 1 months prior to cycle #4 with MRI brain evaluation, or sooner as needed.  All questions were answered. The patient knows to call the clinic with any problems, questions or concerns. No barriers to learning were detected.  The total time spent in the encounter  was 40 minutes and more than 50% was on counseling and review of test results   ZachaVentura SellersMedical Director of Neuro-Oncology [facility] Sugarland[facility]leFlorence3/22 10:48 AM

## 2021-03-07 ENCOUNTER — Other Ambulatory Visit: Payer: Self-pay | Admitting: Radiation Therapy

## 2021-03-07 ENCOUNTER — Inpatient Hospital Stay: Payer: Commercial Managed Care - PPO | Attending: Internal Medicine

## 2021-03-08 ENCOUNTER — Encounter: Payer: Self-pay | Admitting: Internal Medicine

## 2021-03-08 ENCOUNTER — Other Ambulatory Visit: Payer: Self-pay

## 2021-03-08 ENCOUNTER — Other Ambulatory Visit: Payer: BC Managed Care – PPO | Admitting: Student

## 2021-03-08 DIAGNOSIS — R5383 Other fatigue: Secondary | ICD-10-CM

## 2021-03-08 DIAGNOSIS — R630 Anorexia: Secondary | ICD-10-CM

## 2021-03-08 DIAGNOSIS — R531 Weakness: Secondary | ICD-10-CM

## 2021-03-08 DIAGNOSIS — C719 Malignant neoplasm of brain, unspecified: Secondary | ICD-10-CM

## 2021-03-08 DIAGNOSIS — Z515 Encounter for palliative care: Secondary | ICD-10-CM

## 2021-03-08 NOTE — Progress Notes (Signed)
[facility] Consult Note Telephone: [phone]  Fax: [phone]   Date of encounter: 03/08/21 9:16 AM PATIENT NAME: Todd Villa [address] [place] [address]   [phone] (home) [phone] (work) DOB: 05-23-1971 MRN: [id number] PRIMARY CARE PROVIDER:    Donnamarie Rossetti, PA-C,  [place] [address] [phone]  REFERRING PROVIDER:   Altha Harm, NP  RESPONSIBLE PARTY:    Contact Information     Name Relation Home Work Mobile   Ermon, Sagan [phone]  [phone]        I met face to face with patient and family in the home. Palliative Care was asked to follow this patient by consultation request of  Altha Harm, NP to address advance care planning and complex medical decision making. This is the initial visit.                                     ASSESSMENT AND PLAN / RECOMMENDATIONS:   Advance Care Planning/Goals of Care: Goals include to maximize quality of life and symptom management. Patient/health care surrogate gave his/her permission to discuss.Our advance care planning conversation included a discussion about:    The value and importance of advance care planning  Experiences with loved ones who have been seriously ill or have died  Exploration of personal, cultural or spiritual beliefs that might influence medical decisions  Exploration of goals of care in the event of a sudden injury or illness  Identification and preparation of a healthcare agent  MOST form completed today. Uploaded to [facility]. CODE STATUS: Full Code  Education provided on Palliative Medicine in the community. Will continue to provide support, symptom management. Discussed /offered support for patient, family and his children ([place]).  Symptom Management/Plan:  Glioblastoma-continue current treatment plan as scheduled. Follow up with oncology as scheduled.    Fatigue-continue dexamethasone 4 mg daily.  Appetite- encourage patient to eat foods he enjoys, nutritional supplement BID. Follow up with RD as needed.   Generalized weakness- left sided hemiparesis. Continue HH PT/OT. Encourage use of cane for ambulation.   Follow up Palliative Care Visit: Palliative care will continue to follow for complex medical decision making, advance care planning, and clarification of goals. Return in 4 weeks or prn.  I spent 45 minutes providing this consultation. More than 50% of the time in this consultation was spent in counseling and care coordination.   PPS: 50%  HOSPICE ELIGIBILITY/DIAGNOSIS: TBD  Chief Complaint: Palliative initial consult.  HISTORY OF PRESENT ILLNESS:  Todd TRICKEY is a 50 y.o. year old male  with glioblastoma multiforme of brain; patient is status postcraniotomy, resection of right temporal mass. Radiation therapy 5/16 through 12/03/20.  Currently receiving chemotherapy; Temodar every 28 days.  Upcoming cycle of chemotherapy next week; he has follow-up MRI scheduled in 1 month. Diagnoses also include hypertension, obesity, CVA, seizures.  Patient resides home with wife and children; ages 11,19, 63. Patient just returned from beach yesterday. Reports fatigue is improving; currently taking Decadron 96m daily. He denies, pain, shortness of breath. Endorses occasional nausea and constipation' takes ondansetron and stool softener with effectiveness. Appetite is poor to fair.  He is currently receiving OT/PT with [facility]. Uses cane most of the time for ambulation. Last fall in the past week; slipped getting out of shower. He does require assistance with adl's; left sided weakness. No sleep  difficulty.  History obtained from review of EMR, discussion with primary team, and interview with family, facility staff/caregiver and/or Mr. Anselmi.  I reviewed available labs, medications, imaging, studies and related documents from the EMR.   Records reviewed and summarized above.   ROS  General: NAD EYES: denies vision changes ENMT: denies dysphagia Cardiovascular: denies chest pain Pulmonary: denies cough, denies increased SOB Abdomen: occasional constipation, endorses continence of bowel GU: denies dysuria, endorses continence of urine MSK: weakness Skin: denies rashes or wounds Neurological: denies pain, denies insomnia Psych: Endorses stable mood Heme/lymph/immuno: denies bruises, abnormal bleeding  Physical Exam: Pulse 64, resp 16, sats 97% on room air Constitutional: NAD General: frail appearing, obese  EYES: anicteric sclera, lids intact, no discharge  ENMT: intact hearing, oral mucous membranes moist CV: S1S2, RRR, no LE edema Pulmonary: LCTA, no increased work of breathing, no cough, room air Abdomen: normo-active BS + 4 quadrants, soft and non tender GU: deferred MSK: left sided hemiparesis, ambulatory Skin: warm and dry, no rashes or wounds on visible skin Neuro: generalized weakness, A & O x 3 Psych: non-anxious affect; pleasant  Hem/lymph/immuno: no widespread bruising CURRENT PROBLEM LIST:  Patient Active Problem List   Diagnosis Date Noted   Wound drainage 09/15/2020   Slow transit constipation    Sleep disturbance    Hyponatremia    Focal seizures (HCC)    Acute deep vein thrombosis (DVT) of popliteal vein of left lower extremity (HCC)    Vascular headache    Sleep disorder    Left hemiparesis (HCC)    Glioblastoma multiforme of brain ([place]) 08/27/2020   PAST MEDICAL HISTORY:  Active Ambulatory Problems    Diagnosis Date Noted   Glioblastoma multiforme of brain ([place]) 08/27/2020   Acute deep vein thrombosis (DVT) of popliteal vein of left lower extremity (HCC)    Vascular headache    Sleep disorder    Left hemiparesis (HCC)    Sleep disturbance    Hyponatremia    Focal seizures (HCC)    Slow transit constipation    Wound drainage 09/15/2020   Resolved Ambulatory Problems     Diagnosis Date Noted   No Resolved Ambulatory Problems   Past Medical History:  Diagnosis Date   High blood pressure    Obesity    Seizures (HCC)    Stroke (cerebrum) ([place]) 06/2020   SOCIAL HX:  Social History   Tobacco Use   Smoking status: Former    Packs/day: 0.66    Years: 30.00    Pack years: 19.80    Types: Cigarettes    Quit date: 06/18/2020    Years since quitting: 0.7   Smokeless tobacco: Never  Substance Use Topics   Alcohol use: No    Alcohol/week: 0.0 standard drinks   FAMILY HX:  Family History  Problem Relation Age of Onset   Hyperlipidemia Mother    Heart disease Mother    Hypertension Mother    Diabetes Mother    Alcohol abuse Father    Heart disease Father    Hypertension Father    Diabetes Sister    Cancer Neg Hx    Stroke Neg Hx       ALLERGIES:  Allergies  Allergen Reactions   Nuedexta [Dextromethorphan-Quinidine]     Psychosis    Penicillins Other (See Comments)    Childhood reaction - patient is unsure of what happened.     PERTINENT MEDICATIONS:  Outpatient Encounter Medications as of 03/08/2021  Medication Sig   Acetaminophen  500 MG capsule Take 1,000 mg by mouth 2 (two) times daily.   apixaban (ELIQUIS) 5 MG TABS tablet Take 1 tablet (5 mg total) by mouth 2 (two) times daily.   dexamethasone (DECADRON) 1 MG tablet TAKE 3 TABLETS(3 MG) BY MOUTH DAILY   FLUoxetine (PROZAC) 20 MG capsule Take 20 mg by mouth daily.   levETIRAcetam (KEPPRA) 750 MG tablet Take 2 tablets (1,500 mg total) by mouth every 12 (twelve) hours.   Multiple Vitamins-Minerals (MULTIVITAMIN ADULT, MINERALS, PO)    omeprazole (PRILOSEC) 40 MG capsule    ondansetron (ZOFRAN) 8 MG tablet Take 1 tablet (8 mg total) by mouth 2 (two) times daily as needed (nausea and vomiting). May take 30-60 minutes prior to Temodar administration if nausea/vomiting occurs.   temozolomide (TEMODAR) 180 MG capsule Take 1 capsule (180 mg total) by mouth daily. May take on an empty stomach to  decrease nausea & vomiting. (Patient not taking: No sig reported)   temozolomide (TEMODAR) 180 MG capsule Take 1 capsule (180 mg total) by mouth daily. May take on an empty stomach to decrease nausea & vomiting.   temozolomide (TEMODAR) 250 MG capsule Take 1 capsule (250 mg total) by mouth daily. May take on an empty stomach to decrease nausea & vomiting. (Patient not taking: No sig reported)   temozolomide (TEMODAR) 250 MG capsule Take 1 capsule (250 mg total) by mouth daily. May take on an empty stomach to decrease nausea & vomiting.   No facility-administered encounter medications on file as of 03/08/2021.   Thank you for the opportunity to participate in the care of Mr. Riddles.  The palliative care team will continue to follow. Please call our office at [phone] if we can be of additional assistance.   Ezekiel Slocumb, NP   COVID-19 PATIENT SCREENING TOOL Asked and negative response unless otherwise noted:  Have you had symptoms of covid, tested positive or been in contact with someone with symptoms/positive test in the past 5-10 days? No

## 2021-03-14 ENCOUNTER — Inpatient Hospital Stay: Payer: BC Managed Care – PPO | Attending: Hospice and Palliative Medicine | Admitting: Hospice and Palliative Medicine

## 2021-03-14 DIAGNOSIS — Z515 Encounter for palliative care: Secondary | ICD-10-CM

## 2021-03-14 DIAGNOSIS — C712 Malignant neoplasm of temporal lobe: Secondary | ICD-10-CM | POA: Insufficient documentation

## 2021-03-14 DIAGNOSIS — Z79899 Other long term (current) drug therapy: Secondary | ICD-10-CM | POA: Insufficient documentation

## 2021-03-14 NOTE — Progress Notes (Signed)
Was unable to reach patient.  Voicemail left.  Will reschedule.

## 2021-03-15 ENCOUNTER — Other Ambulatory Visit: Payer: Self-pay | Admitting: Internal Medicine

## 2021-03-15 ENCOUNTER — Encounter: Payer: Self-pay | Admitting: Internal Medicine

## 2021-03-15 MED ORDER — LEVETIRACETAM 750 MG PO TABS: 1500.0000 mg | ORAL_TABLET | Freq: Two times a day (BID) | ORAL | 3 refills | Status: DC

## 2021-03-15 MED ORDER — DEXAMETHASONE 1 MG PO TABS: ORAL_TABLET | ORAL | 1 refills | Status: DC

## 2021-03-19 ENCOUNTER — Other Ambulatory Visit: Payer: Self-pay | Admitting: Internal Medicine

## 2021-03-20 ENCOUNTER — Encounter: Payer: Self-pay | Admitting: Internal Medicine

## 2021-03-21 ENCOUNTER — Encounter: Payer: Self-pay | Admitting: Internal Medicine

## 2021-03-24 ENCOUNTER — Other Ambulatory Visit: Payer: Self-pay | Admitting: Internal Medicine

## 2021-03-24 DIAGNOSIS — C719 Malignant neoplasm of brain, unspecified: Secondary | ICD-10-CM

## 2021-03-24 NOTE — Telephone Encounter (Signed)
Patient has to have labs and md visit prior to consideration for refill of temozolomide.

## 2021-03-28 ENCOUNTER — Ambulatory Visit: Payer: Commercial Managed Care - PPO

## 2021-03-29 ENCOUNTER — Ambulatory Visit
Admission: RE | Admit: 2021-03-29 | Discharge: 2021-03-29 | Disposition: A | Discharge status: Home / Self Care | Payer: BC Managed Care – PPO | Source: Ambulatory Visit | Attending: Internal Medicine | Admitting: Internal Medicine

## 2021-03-29 ENCOUNTER — Other Ambulatory Visit: Payer: Self-pay

## 2021-03-29 DIAGNOSIS — C719 Malignant neoplasm of brain, unspecified: Secondary | ICD-10-CM | POA: Insufficient documentation

## 2021-03-29 MED ORDER — GADOBUTROL 1 MMOL/ML IV SOLN
10.0000 mL | Freq: Once | INTRAVENOUS | Status: AC | PRN
  Administered 2021-03-29: 10 mL via INTRAVENOUS

## 2021-03-30 ENCOUNTER — Telehealth: Payer: Self-pay | Admitting: Student

## 2021-03-30 NOTE — Telephone Encounter (Signed)
Attempted to contact patient's wife Joelene Millin, to see if we could reschedule the 04/07/21 Palliative f/u visit to 03/31/21 @ 10:30 - 11 AM, no answer left message requesting a return call.  2:50 PM:  Rec'd a return call from wife and she stated that would be fine to reschedule f/u visit for 03/31/21.

## 2021-03-31 ENCOUNTER — Other Ambulatory Visit: Payer: Self-pay

## 2021-03-31 ENCOUNTER — Other Ambulatory Visit: Payer: BC Managed Care – PPO | Admitting: Student

## 2021-03-31 DIAGNOSIS — B348 Other viral infections of unspecified site: Secondary | ICD-10-CM

## 2021-03-31 DIAGNOSIS — Z515 Encounter for palliative care: Secondary | ICD-10-CM

## 2021-03-31 DIAGNOSIS — R5383 Other fatigue: Secondary | ICD-10-CM

## 2021-03-31 DIAGNOSIS — R531 Weakness: Secondary | ICD-10-CM

## 2021-03-31 DIAGNOSIS — C719 Malignant neoplasm of brain, unspecified: Secondary | ICD-10-CM

## 2021-03-31 NOTE — Progress Notes (Signed)
[facility] Consult Note Telephone: [phone]  Fax: [phone]    Date of encounter: 03/31/21 11:07 AM PATIENT NAME: Todd Villa [address] [place] [address]   [phone] (home) [phone] (work) DOB: 02/27/1971 MRN: [id number] PRIMARY CARE PROVIDER:    Donnamarie Rossetti, PA-C,  [place] [address] [phone]  REFERRING PROVIDER:   Donnamarie Rossetti, PA-C [address] [place],  [place] [address] [phone]  RESPONSIBLE PARTY:    Contact Information     Name Relation Home Work [place] [phone]        I met face to face with patient and family in  home/facility. Palliative Care was asked to follow this patient by consultation request of  Venetia Maxon, Rolanda Jay,* to address advance care planning and complex medical decision making. This is a follow up visit.                                   ASSESSMENT AND PLAN / RECOMMENDATIONS:   Advance Care Planning/Goals of Care: Goals include to maximize quality of life and symptom management.   CODE STATUS: Full Code  Symptom Management/Plan:  Glioblastoma-patient with follow-up appointment tomorrow with oncology, will discuss most recent MRI results and plan going forward.    Fatigue-patient reports fatigue has improved, continue dexamethasone as directed daily.    Generalized weakness- left sided hemiparesis. Continue HH PT/OT. Encourage use of cane for ambulation.   Rhinovirus- symptoms consistent with cold x past 3 days. Wife to perform home COVID test to rule out. Recommend Mucinex 600 mg twice daily albuterol nebulizers every 6 hours as needed for shortness of breath wheezing, cetirizine 10 mg daily.  Follow up Palliative Care Visit: Palliative care will continue to follow for complex medical decision making, advance care planning, and clarification  of goals. Return 4-6 weeks or prn.  I spent 40 minutes providing this consultation. More than 50% of the time in this consultation was spent in counseling and care coordination.    PPS: 50%  HOSPICE ELIGIBILITY/DIAGNOSIS: TBD  Chief Complaint: Palliative Medicine follow up visit.   HISTORY OF PRESENT ILLNESS:  Todd Villa is a 50 y.o. year old male with glioblastoma multiforme of brain; patient is status postcraniotomy, resection of right temporal mass. Radiation therapy 5/16 through 12/03/20. Temodar every 28 days. Diagnoses also include hypertension, obesity, CVA, seizures.   Patient reports having increased nasal drainage in the past two days, congestion. Low grade fever 99.0; no chills. No sore throat. Endorses wheezing, cough worse at night. Stopped smoking in January. Unaware of being around anyone sick recently, although he did go to [place] this past week. Fatigue better, appetite good. Drinking adequate fluids. Has occasional episodes of dizziness when he gets up. No seizure activity. Continues to receive [facility]. Last fall a week ago in bathroom. Right foot swells > left. A 10-point review of systems is negative, except for the pertinent positives and negatives detailed in the HPI.    History obtained from review of EMR, discussion with primary team, and interview with family, facility staff/caregiver and/or Todd Villa.  I reviewed available labs, medications, imaging, studies and related documents from the EMR.  Records reviewed and summarized above.    Physical Exam: Pulse 68, resp 20, b/p 130/78, sats 97% on room air Constitutional: NAD General: frail appearing EYES:  anicteric sclera, lids intact, no discharge  ENMT: intact hearing, oral mucous membranes moist, dentition intact CV: S1S2, RRR, 1+ LE edema Pulmonary: scattered rhonchi on right, otherwise lungs clear, no increased work of breathing, no cough, room air Abdomen: normo-active BS + 4 quadrants, soft and  non tender GU: deferred MSK: left sided hemiparesis, ambulatory Skin: warm and dry, no rashes or wounds on visible skin Neuro generalized weakness, A & O x 3 Psych: non-anxious affect, pleasant Hem/lymph/immuno: no widespread bruising   Thank you for the opportunity to participate in the care of Todd Villa.  The palliative care team will continue to follow. Please call our office at [phone] if we can be of additional assistance.   Ezekiel Slocumb, NP   COVID-19 PATIENT SCREENING TOOL Asked and negative response unless otherwise noted:   Have you had symptoms of covid, tested positive or been in contact with someone with symptoms/positive test in the past 5-10 days?

## 2021-04-01 ENCOUNTER — Encounter: Payer: Self-pay | Admitting: Internal Medicine

## 2021-04-01 ENCOUNTER — Inpatient Hospital Stay: Payer: BC Managed Care – PPO

## 2021-04-01 ENCOUNTER — Other Ambulatory Visit: Payer: Self-pay

## 2021-04-01 ENCOUNTER — Inpatient Hospital Stay: Payer: BC Managed Care – PPO | Admitting: Hospice and Palliative Medicine

## 2021-04-01 ENCOUNTER — Inpatient Hospital Stay (HOSPITAL_BASED_OUTPATIENT_CLINIC_OR_DEPARTMENT_OTHER): Payer: BC Managed Care – PPO | Admitting: Internal Medicine

## 2021-04-01 VITALS — BP 138/91 | HR 88 | Wt 303.0 lb

## 2021-04-01 DIAGNOSIS — C719 Malignant neoplasm of brain, unspecified: Secondary | ICD-10-CM

## 2021-04-01 DIAGNOSIS — C712 Malignant neoplasm of temporal lobe: Secondary | ICD-10-CM | POA: Diagnosis present

## 2021-04-01 DIAGNOSIS — Z79899 Other long term (current) drug therapy: Secondary | ICD-10-CM | POA: Diagnosis not present

## 2021-04-01 DIAGNOSIS — R569 Unspecified convulsions: Secondary | ICD-10-CM

## 2021-04-01 LAB — CBC WITH DIFFERENTIAL/PLATELET
Abs Immature Granulocytes: 0.11 10*3/uL — ABNORMAL HIGH (ref 0.00–0.07)
Basophils Absolute: 0 10*3/uL (ref 0.0–0.1)
Basophils Relative: 0 %
Eosinophils Absolute: 0 10*3/uL (ref 0.0–0.5)
Eosinophils Relative: 0 %
HCT: 37.6 % — ABNORMAL LOW (ref 39.0–52.0)
Hemoglobin: 12.7 g/dL — ABNORMAL LOW (ref 13.0–17.0)
Immature Granulocytes: 2 %
Lymphocytes Relative: 17 %
Lymphs Abs: 1.2 10*3/uL (ref 0.7–4.0)
MCH: 33.8 pg (ref 26.0–34.0)
MCHC: 33.8 g/dL (ref 30.0–36.0)
MCV: 100 fL (ref 80.0–100.0)
Monocytes Absolute: 0.7 10*3/uL (ref 0.1–1.0)
Monocytes Relative: 10 %
Neutro Abs: 5.1 10*3/uL (ref 1.7–7.7)
Neutrophils Relative %: 71 %
Platelets: 216 10*3/uL (ref 150–400)
RBC: 3.76 MIL/uL — ABNORMAL LOW (ref 4.22–5.81)
RDW: 15.9 % — ABNORMAL HIGH (ref 11.5–15.5)
WBC: 7.1 10*3/uL (ref 4.0–10.5)
nRBC: 0 % (ref 0.0–0.2)

## 2021-04-01 LAB — COMPREHENSIVE METABOLIC PANEL
ALT: 26 U/L (ref 0–44)
AST: 18 U/L (ref 15–41)
Albumin: 3.3 g/dL — ABNORMAL LOW (ref 3.5–5.0)
Alkaline Phosphatase: 42 U/L (ref 38–126)
Anion gap: 10 (ref 5–15)
BUN: 11 mg/dL (ref 6–20)
CO2: 24 mmol/L (ref 22–32)
Calcium: 8.5 mg/dL — ABNORMAL LOW (ref 8.9–10.3)
Chloride: 103 mmol/L (ref 98–111)
Creatinine, Ser: 0.78 mg/dL (ref 0.61–1.24)
GFR, Estimated: >60 mL/min (ref >60)
Glucose, Bld: 99 mg/dL (ref 70–99)
Potassium: 3.5 mmol/L (ref 3.5–5.1)
Sodium: 137 mmol/L (ref 135–145)
Total Bilirubin: 0.7 mg/dL (ref 0.3–1.2)
Total Protein: 6.4 g/dL — ABNORMAL LOW (ref 6.5–8.1)

## 2021-04-01 NOTE — Progress Notes (Signed)
[facility] [place], [place] [address] [phone]   Interval Evaluation  Date of Service: 04/01/21 Patient Name: Todd Villa Patient MRN: [id number] Patient DOB: 07/16/1970 Provider: Ventura Sellers, MD  Identifying Statement:  Todd Villa is a 50 y.o. 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 Lacinda Axon; path demonstrates Glioblastoma IDH-1 wild type   09/16/2020 Surgery   Wound flap washout with Dr. Lacinda Axon; collection speciates proteus.  PICC line placed for cefepime therapy.   10/25/2020 - 12/03/2020 Radiation Therapy   IMRT and concurrent Temodar 50m/m2   01/07/2021 -  Chemotherapy    Patient is on Treatment Plan: BRAIN GLIOBLASTOMA CONSOLIDATION TEMOZOLOMIDE DAYS 1-5 Q28 DAYS          Biomarkers:  MGMT Unknown.  IDH 1/2 Wild type.  EGFR Unknown  TERT Unknown   Interval History:  Todd RASNICpresents today for follow up after 1 month interval MRI brain, now having completed cycle #3 of (dose escalated) 5-day Temodar.  Tolerated treatment well again without nausea.  He does describe some lightheadedness with more vigorous activity.  He is still walking around the home with cane assist.  Complains today of stuffiness, head cold, low grade fever last night. Continues to experience dense left sided weakness with the arm.    H+P (09/24/20) Patient presents to clinic today for clinical review and treatment planning following repeat craniotomy last week.  During rehabilitation stay [facility] developed soft tissue infection[facility] and washout.  He was initially placed on IV antibiotics (cefepime), but was transitioned to ciprofloxacin upon discharge, which he is taking currently.  He continues to complain of  dense left sided weakness, modestly improved from prior.  He has been back working (from home) mostly using telephone and computer.  Does complain of mild fatigue and has been napping daily.      Decadron 12/17/20: 4107m  Medications: Current Outpatient Medications on File Prior to Visit  Medication Sig Dispense Refill   Acetaminophen 500 MG capsule Take 1,000 mg by mouth 2 (two) times daily.     dexamethasone (DECADRON) 1 MG tablet TAKE 3 TABLETS(3 MG) BY MOUTH DAILY 90 tablet 1   ELIQUIS 5 MG TABS tablet TAKE 1 TABLET BY MOUTH  TWICE DAILY 60 tablet 11   FLUoxetine (PROZAC) 20 MG capsule Take 20 mg by mouth daily.     levETIRAcetam (KEPPRA) 750 MG tablet Take 2 tablets (1,500 mg total) by mouth every 12 (twelve) hours. 120 tablet 3   Multiple Vitamins-Minerals (MULTIVITAMIN ADULT, MINERALS, PO)      omeprazole (PRILOSEC) 40 MG capsule      ondansetron (ZOFRAN) 8 MG tablet Take 1 tablet (8 mg total) by mouth 2 (two) times daily as needed (nausea and vomiting). May take 30-60 minutes prior to Temodar administration if nausea/vomiting occurs. 30 tablet 1   temozolomide (TEMODAR) 180 MG capsule Take 1 capsule (180 mg total) by mouth daily. May take on an empty stomach to decrease nausea & vomiting. 5 capsule 0   temozolomide (TEMODAR) 180 MG capsule Take 1 capsule (180 mg total) by mouth daily. May take on an empty stomach to decrease nausea & vomiting. 5 capsule 0   temozolomide (TEMODAR) 250 MG capsule Take 1 capsule (250 mg  total) by mouth daily. May take on an empty stomach to decrease nausea & vomiting. (Patient not taking: No sig reported) 5 capsule 0   temozolomide (TEMODAR) 250 MG capsule Take 1 capsule (250 mg total) by mouth daily. May take on an empty stomach to decrease nausea & vomiting. 5 capsule 0   No current facility-administered medications on file prior to visit.    Allergies:  Allergies  Allergen Reactions   Nuedexta [Dextromethorphan-Quinidine]     Psychosis     Penicillins Other (See Comments)    Childhood reaction - patient is unsure of what happened.   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   Past Surgical History:  Past Surgical History:  Procedure Laterality Date   IR IVC FILTER PLMT / S&I /IMG GUID/MOD SED  08/29/2020   ROTATOR CUFF REPAIR Left 2015   Social History:  Social History   Socioeconomic History   Marital status: Married    Spouse name: Not on file   Number of children: Not on file   Years of education: Not on file   Highest education level: Not on file  Occupational History   Not on file  Tobacco Use   Smoking status: Former    Packs/day: 0.66    Years: 30.00    Pack years: 19.80    Types: Cigarettes    Quit date: 06/18/2020    Years since quitting: 0.7   Smokeless tobacco: Never  Substance and Sexual Activity   Alcohol use: No    Alcohol/week: 0.0 standard drinks   Drug use: No   Sexual activity: Yes  Other Topics Concern   Not on file  Social History Narrative   Not on file   Social Determinants of Health   Financial Resource Strain: Not on file  Food Insecurity: Not on file  Transportation Needs: Not on file  Physical Activity: Not on file  Stress: Not on file  Social Connections: Not on file  Intimate Partner Violence: Not on file   Family History:  Family History  Problem Relation Age of Onset   Hyperlipidemia Mother    Heart disease Mother    Hypertension Mother    Diabetes Mother    Alcohol abuse Father    Heart disease Father    Hypertension Father    Diabetes Sister    Cancer Neg Hx    Stroke Neg Hx     Review of Systems: Constitutional: Doesn't report fevers, chills or abnormal weight loss Eyes: Doesn't report blurriness of vision Ears, nose, mouth, throat, and face: Doesn't report sore throat Respiratory: Doesn't report cough, dyspnea or wheezes Cardiovascular:  Doesn't report palpitation, chest discomfort  Gastrointestinal:  Doesn't report nausea, constipation, diarrhea GU: Doesn't report incontinence Skin: Doesn't report skin rashes Neurological: Per HPI Musculoskeletal: Doesn't report joint pain Behavioral/Psych: Doesn't report anxiety  Physical Exam: Vitals:   04/01/21 0932  BP: (!) 138/91  Pulse: 88   KPS: 60. General: Alert, cooperative, pleasant, in no acute distress Head: Craniectomy defect EENT: No conjunctival injection or scleral icterus.  Lungs: Resp effort normal Cardiac: Regular rate Abdomen: Non-distended abdomen Skin: No rashes cyanosis or petechiae. Extremities: No clubbing or edema  Neurologic Exam: Mental Status: Awake, alert, attentive to examiner. Oriented to self and environment. Language is fluent with intact comprehension.  Cranial Nerves: Visual acuity is grossly normal. Visual fields are  full. Extra-ocular movements intact. No ptosis. Face is symmetric Motor: Tone and bulk are normal. Left arm is 4/5 proximally, 2/5 distally in hand, left leg is 4/5. Reflexes are spastic, no pathologic reflexes present.  Sensory: Intact to light touch Gait: Deferred   Labs: I have reviewed the data as listed    Component Value Date/Time   NA 139 03/04/2021 0928   NA 137 07/14/2013 1031   K 3.1 (L) 03/04/2021 0928   K 3.8 07/14/2013 1031   CL 101 03/04/2021 0928   CL 107 07/14/2013 1031   CO2 24 03/04/2021 0928   CO2 25 07/14/2013 1031   GLUCOSE 140 (H) 03/04/2021 0928   GLUCOSE 125 (H) 07/14/2013 1031   BUN 20 03/04/2021 0928   BUN 9 07/14/2013 1031   CREATININE 0.87 03/04/2021 0928   CREATININE 0.98 07/14/2013 1031   CALCIUM 8.7 (L) 03/04/2021 0928   CALCIUM 8.7 07/14/2013 1031   PROT 6.6 03/04/2021 0928   ALBUMIN 3.6 03/04/2021 0928   AST 23 03/04/2021 0928   ALT 33 03/04/2021 0928   ALKPHOS 34 (L) 03/04/2021 0928   BILITOT 0.6 03/04/2021 0928   GFRNONAA >60 03/04/2021 0928   GFRNONAA >60 07/14/2013 1031    GFRAA >60 07/14/2013 1031   Lab Results  Component Value Date   WBC 7.1 04/01/2021   NEUTROABS 5.1 04/01/2021   HGB 12.7 (L) 04/01/2021   HCT 37.6 (L) 04/01/2021   MCV 100.0 04/01/2021   PLT 216 04/01/2021   Imaging:  [place] Clinician Interpretation: I have personally reviewed the CNS images as listed.  My interpretation, in the context of the patient's clinical presentation, is stable disease  MR Brain W Wo Contrast  Result Date: 04/01/2021 CLINICAL DATA:  Brain/CNS neoplasm, surveillance. Follow up glioblastoma. History of craniotomy. No known injury EXAM: MRI HEAD WITHOUT AND WITH CONTRAST TECHNIQUE: Multiplanar, multiecho pulse sequences of the brain and surrounding structures were obtained without and with intravenous contrast. CONTRAST:  21m GADAVIST GADOBUTROL 1 MMOL/ML IV SOLN COMPARISON:  MR head 03/02/2021. FINDINGS: Brain: Multiple heterogeneously enhancing masses about a right frontoparietal resection cavity are unchanged from the prior study with the largest measuring 3.6 x 2.6 cm (series 18, image 116). Extensive nonenhancing T2 hyperintensity in the surrounding white matter is unchanged, and there is unchanged brain herniation through the overlying craniectomy defect. Chronic blood products are again noted along the resection cavity. No new abnormal enhancement or midline shift is evident. Wallerian degeneration is again noted in the brainstem. A small chronic right cerebellar infarct is unchanged. There is mild cerebral atrophy. No acute infarct or significant extra-axial fluid collection is present. 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 Vascular: Major intracranial vascular flow voids are preserved. Skull and upper cervical spine: No suspicious marrow lesion. Sinuses/Orbits: Unremarkable orbits. Small mucous retention cysts in the maxillary sinuses. Minimal bilateral mastoid fluid. Other: None. IMPRESSION: Unchanged enhancing masses about the right frontoparietal resection  cavity. Unchanged surrounding T2 hyperintensity. Electronically Signed   By: ALogan BoresM.D.   On: 04/01/2021 08:54   MR BRAIN W WO CONTRAST  Result Date: 03/03/2021 CLINICAL DATA:  Brain/CNS neoplasm, assess treatment response EXAM: MRI HEAD WITHOUT AND WITH CONTRAST TECHNIQUE: Multiplanar, multiecho pulse sequences of the brain and surrounding structures were obtained without and with intravenous contrast. CONTRAST:  155mGADAVIST GADOBUTROL 1 MMOL/ML IV SOLN COMPARISON:  MRI 01/05/2021. FINDINGS: Brain: Redemonstrated resection cavity in the right frontal and parietal lobes with no substantial change in extensive surrounding T2 hyperintensity. Similar  herniation of brain through the craniectomy defect. Similar wallerian degeneration involving the right midbrain. Similar sequela of prior hemorrhage. No midline shift. There are multiple areas of enhancement surrounding the resection cavity, some of which have increased in size/bulk. For example along the posterior aspect of the resection cavity there is an area of masslike enhancement that measures 3.7 x 2.3 cm on series 18, image 104 (previously 3.1 x 2.1 cm). Additional area of masslike enhancement along the superomedial aspect of the resection cavity measures 2.1 x 1.4 cm on series 18, image 124 (previously 1.6 x 1.0 cm). Along the anterior/inferior aspect of the resection cavity there is new small area of enhancement (series 18, image 98) with adjacent nodular area of enhancement that is increased in size (measuring 1.1 cm on series 18, image 102; previously 0.6 cm). No evidence of acute infarct, acute hemorrhage, hydrocephalus. Partially empty sella. Vascular: Major arterial flow voids are maintained at the skull base. Skull and upper cervical spine: Normal marrow signal. Sinuses/Orbits: Mild paranasal sinus mucosal thickening. No acute orbital findings. Other: No sizable mastoid effusions. IMPRESSION: Interval increase in size/bulk of multiple areas of  enhancing tumor surrounding the resection cavity with new small area of enhancement anteriorly, detailed above and concerning for tumor progression. Surrounding T2 hyperintense signal is similar. Electronically Signed   By: Margaretha Sheffield M.D.   On: 03/03/2021 12:40     Assessment/Plan Glioblastoma multiforme of brain ([place]) [C71.9]  Todd Villa is clinically stable today, now having completed cycle #3 of adjuvant Temozolomide.  MRI brain demonstrates stability of enhancing and T2 signal volumes over the past month, hopefully consistent with radiation treatment effect.  After one week delay due to current URI, we recommended continuing treatment with an additional cycle #4 Temozolomide 236m/m2, on for five days and off for twenty three days in twenty eight day cycles. The patient will have a complete blood count performed on days 21 and 28 of each cycle, and a comprehensive metabolic panel performed on day 28 of each cycle. Labs may need to be performed more often. Zofran will prescribed for home use for nausea/vomiting.   Chemotherapy should be held for the following:  ANC less than 1,000  Platelets less than 100,000  LFT or creatinine greater than 2x ULN  If clinical concerns/contraindications develop  Decadron can be decreased to 134monce he is over his virus and chemotherapy dosing is completed this month.  Should continue Keppra 150049mID, Eliquis 5mg63mD.  We ask that Todd CORRALESurn to clinic in 1 months prior to cycle #5 with labs for evaluation, or sooner as needed.  All questions were answered. The patient knows to call the clinic with any problems, questions or concerns. No barriers to learning were detected.  The total time spent in the encounter was 30 minutes and more than 50% was on counseling and review of test results   ZachVentura Sellers Medical Director of Neuro-Oncology [facility] Surgery CenterWeslAskov21/22 9:25 AM

## 2021-04-01 NOTE — Progress Notes (Signed)
Nutrition Follow-up:  Patient with glioblastoma.  Patient on temodar.  Met with patient and wife for nutrition follow-up.  Patient reports that his appetite is better.  Drinking one boost plus per day.  Eating oatmeal and fruit for breakfast or breakfast sandwich.  Drinks a boost usually in the morning as well.  Lunch yesterday was potato salad. Later in the afternoon ate some chocolate and cheez-its.  Supper was salisbury steak, mashed potatoes, green beans.    Denies nausea, constipation or diarrhea    Medications: decadron  Labs: reviewed  Anthropometrics:   Weight 303 lb today  300 lb 14.4 oz on 9/23 301 lb on 9/13 327 lb on 3/8   NUTRITION DIAGNOSIS: Inadequate oral intake improving   INTERVENTION:  Continue boost plus 1 time per day Continue with good sources of protein at every meal/snack    MONITORING, EVALUATION, GOAL: weight trends, intake   NEXT VISIT: Friday, Dec 2 after MD visit  Kama Cammarano B. Zenia Resides, [place], [place] Registered Dietitian [phone] (mobile)

## 2021-04-04 ENCOUNTER — Inpatient Hospital Stay: Payer: BC Managed Care – PPO | Attending: Internal Medicine

## 2021-04-07 ENCOUNTER — Other Ambulatory Visit: Payer: Self-pay | Admitting: Internal Medicine

## 2021-04-07 DIAGNOSIS — C719 Malignant neoplasm of brain, unspecified: Secondary | ICD-10-CM

## 2021-04-11 ENCOUNTER — Telehealth: Payer: Self-pay | Admitting: *Deleted

## 2021-04-11 ENCOUNTER — Other Ambulatory Visit: Payer: Self-pay | Admitting: Internal Medicine

## 2021-04-11 DIAGNOSIS — C719 Malignant neoplasm of brain, unspecified: Secondary | ICD-10-CM

## 2021-04-11 NOTE — Telephone Encounter (Signed)
Patient is in need of refill of his Temodar 250 mg and 180 mg request sent 04/07/21

## 2021-04-13 ENCOUNTER — Telehealth: Payer: Self-pay | Admitting: *Deleted

## 2021-04-13 ENCOUNTER — Encounter: Payer: Self-pay | Admitting: Internal Medicine

## 2021-04-13 MED ORDER — TEMOZOLOMIDE 180 MG PO CAPS: 180.0000 mg | ORAL_CAPSULE | Freq: Every day | ORAL | 0 refills | Status: AC

## 2021-04-13 MED ORDER — TEMOZOLOMIDE 250 MG PO CAPS: 250.0000 mg | ORAL_CAPSULE | Freq: Every day | ORAL | 0 refills | Status: AC

## 2021-04-13 NOTE — Telephone Encounter (Signed)
Wife states Todd Villa needs a refill of Temodar 180 mg and Temodar 250 mg

## 2021-04-14 ENCOUNTER — Telehealth: Payer: Self-pay | Admitting: *Deleted

## 2021-04-14 ENCOUNTER — Telehealth: Payer: Self-pay

## 2021-04-14 NOTE — Telephone Encounter (Signed)
Prior authorization request received via fax from [facility] for Temozolomide 250mg .  Form was completed and faxed back to [facility] with last Dr. Mickeal Skinner office visit note.

## 2021-04-14 NOTE — Telephone Encounter (Signed)
Received call from [place] with [facility] [phone] with Occupational Therapy.  Needing orders twice week for 4 weeks (2 week 4), once a week for 4 weeks (1week4) for occupational therapy.

## 2021-04-15 ENCOUNTER — Encounter: Payer: Self-pay | Admitting: Internal Medicine

## 2021-04-15 NOTE — Telephone Encounter (Signed)
Called [facility] [phone]) to check status of PA.  Temozolomide has been approved as of 04/15/2021.

## 2021-04-26 ENCOUNTER — Encounter: Payer: Self-pay | Admitting: Internal Medicine

## 2021-04-29 ENCOUNTER — Encounter: Payer: Self-pay | Admitting: Internal Medicine

## 2021-05-06 ENCOUNTER — Other Ambulatory Visit: Payer: Self-pay | Admitting: Internal Medicine

## 2021-05-06 DIAGNOSIS — C719 Malignant neoplasm of brain, unspecified: Secondary | ICD-10-CM

## 2021-05-09 ENCOUNTER — Encounter: Payer: Self-pay | Admitting: Internal Medicine

## 2021-05-13 ENCOUNTER — Inpatient Hospital Stay: Payer: BC Managed Care – PPO | Attending: Internal Medicine

## 2021-05-13 ENCOUNTER — Encounter: Payer: Self-pay | Admitting: Radiation Oncology

## 2021-05-13 ENCOUNTER — Ambulatory Visit
Admission: RE | Admit: 2021-05-13 | Discharge: 2021-05-13 | Disposition: A | Discharge status: Home / Self Care | Payer: BC Managed Care – PPO | Source: Ambulatory Visit | Attending: Radiation Oncology | Admitting: Radiation Oncology

## 2021-05-13 ENCOUNTER — Inpatient Hospital Stay: Payer: BC Managed Care – PPO

## 2021-05-13 ENCOUNTER — Other Ambulatory Visit: Payer: Self-pay

## 2021-05-13 ENCOUNTER — Inpatient Hospital Stay (HOSPITAL_BASED_OUTPATIENT_CLINIC_OR_DEPARTMENT_OTHER): Payer: BC Managed Care – PPO | Admitting: Internal Medicine

## 2021-05-13 ENCOUNTER — Other Ambulatory Visit: Payer: Self-pay | Admitting: Internal Medicine

## 2021-05-13 VITALS — BP 141/113 | HR 115 | Temp 99.8°F | Resp 18 | Wt 310.0 lb

## 2021-05-13 VITALS — BP 136/105 | HR 109 | Temp 99.6°F

## 2021-05-13 DIAGNOSIS — Z79899 Other long term (current) drug therapy: Secondary | ICD-10-CM | POA: Diagnosis not present

## 2021-05-13 DIAGNOSIS — Z923 Personal history of irradiation: Secondary | ICD-10-CM | POA: Diagnosis not present

## 2021-05-13 DIAGNOSIS — C712 Malignant neoplasm of temporal lobe: Secondary | ICD-10-CM | POA: Insufficient documentation

## 2021-05-13 DIAGNOSIS — C719 Malignant neoplasm of brain, unspecified: Secondary | ICD-10-CM

## 2021-05-13 DIAGNOSIS — R569 Unspecified convulsions: Secondary | ICD-10-CM | POA: Diagnosis not present

## 2021-05-13 LAB — CBC WITH DIFFERENTIAL/PLATELET
Abs Immature Granulocytes: 0.06 10*3/uL (ref 0.00–0.07)
Basophils Absolute: 0 10*3/uL (ref 0.0–0.1)
Basophils Relative: 0 %
Eosinophils Absolute: 0.1 10*3/uL (ref 0.0–0.5)
Eosinophils Relative: 1 %
HCT: 37.8 % — ABNORMAL LOW (ref 39.0–52.0)
Hemoglobin: 12.7 g/dL — ABNORMAL LOW (ref 13.0–17.0)
Immature Granulocytes: 1 %
Lymphocytes Relative: 15 %
Lymphs Abs: 1.2 10*3/uL (ref 0.7–4.0)
MCH: 34.4 pg — ABNORMAL HIGH (ref 26.0–34.0)
MCHC: 33.6 g/dL (ref 30.0–36.0)
MCV: 102.4 fL — ABNORMAL HIGH (ref 80.0–100.0)
Monocytes Absolute: 0.8 10*3/uL (ref 0.1–1.0)
Monocytes Relative: 10 %
Neutro Abs: 5.7 10*3/uL (ref 1.7–7.7)
Neutrophils Relative %: 73 %
Platelets: 243 10*3/uL (ref 150–400)
RBC: 3.69 MIL/uL — ABNORMAL LOW (ref 4.22–5.81)
RDW: 13.2 % (ref 11.5–15.5)
WBC: 7.9 10*3/uL (ref 4.0–10.5)
nRBC: 0 % (ref 0.0–0.2)

## 2021-05-13 LAB — COMPREHENSIVE METABOLIC PANEL
ALT: 26 U/L (ref 0–44)
AST: 17 U/L (ref 15–41)
Albumin: 3.4 g/dL — ABNORMAL LOW (ref 3.5–5.0)
Alkaline Phosphatase: 59 U/L (ref 38–126)
Anion gap: 12 (ref 5–15)
BUN: 8 mg/dL (ref 6–20)
CO2: 27 mmol/L (ref 22–32)
Calcium: 8.8 mg/dL — ABNORMAL LOW (ref 8.9–10.3)
Chloride: 101 mmol/L (ref 98–111)
Creatinine, Ser: 0.89 mg/dL (ref 0.61–1.24)
GFR, Estimated: >60 mL/min (ref >60)
Glucose, Bld: 104 mg/dL — ABNORMAL HIGH (ref 70–99)
Potassium: 3.4 mmol/L — ABNORMAL LOW (ref 3.5–5.1)
Sodium: 140 mmol/L (ref 135–145)
Total Bilirubin: 0.8 mg/dL (ref 0.3–1.2)
Total Protein: 6.6 g/dL (ref 6.5–8.1)

## 2021-05-13 MED ORDER — TEMOZOLOMIDE 250 MG PO CAPS: 250.0000 mg | ORAL_CAPSULE | Freq: Every day | ORAL | 0 refills | Status: AC

## 2021-05-13 MED ORDER — LAMOTRIGINE 100 MG PO TABS: 100.0000 mg | ORAL_TABLET | Freq: Two times a day (BID) | ORAL | 3 refills | Status: DC

## 2021-05-13 MED ORDER — TEMOZOLOMIDE 180 MG PO CAPS
180.0000 mg | ORAL_CAPSULE | Freq: Every day | ORAL | 0 refills | Status: AC
Start: 2021-05-13 — End: ?

## 2021-05-13 MED ORDER — LAMOTRIGINE 25 MG PO TABS: 50.0000 mg | ORAL_TABLET | Freq: Every day | ORAL | 0 refills | Status: DC

## 2021-05-13 MED ORDER — FUROSEMIDE 20 MG PO TABS: 20.0000 mg | ORAL_TABLET | Freq: Two times a day (BID) | ORAL | 1 refills | Status: DC | PRN

## 2021-05-13 NOTE — Telephone Encounter (Signed)
Refilled on 12/2. Gardiner Rhyme, RN

## 2021-05-13 NOTE — Progress Notes (Signed)
[facility] [place], [place] [address] [phone]   Interval Evaluation  Date of Service: 05/13/21 Patient Name: Todd Villa Patient MRN: [id number] Patient DOB: 03/06/1971 Provider: Ventura Sellers, MD  Identifying Statement:  Todd Villa is a 50 y.o. male with right temporal glioblastoma   Oncologic History: Oncology History  Glioblastoma multiforme of brain [facility])  08/19/2020 Surgery   Craniotomy, resection of right temporal mass with Dr. Lacinda Axon; path demonstrates Glioblastoma IDH-1 wild type   09/16/2020 Surgery   Wound flap washout with Dr. Lacinda Axon; collection speciates proteus.  PICC line placed for cefepime therapy.   10/25/2020 - 12/03/2020 Radiation Therapy   IMRT and concurrent Temodar 62m/m2   01/07/2021 -  Chemotherapy    Patient is on Treatment Plan: BRAIN GLIOBLASTOMA CONSOLIDATION TEMOZOLOMIDE DAYS 1-5 Q28 DAYS          Biomarkers:  MGMT Unknown.  IDH 1/2 Wild type.  EGFR Unknown  TERT Unknown   Interval History:  Todd MARINGpresents today for follow up after 1 month interval MRI brain, now having completed cycle #4 of (dose escalated) 5-day Temodar.  Tolerated treatment well again without nausea.  He broke his left arm last week after a fall in the arm, it is currently in a sling.  Wife describes increasing behavioral decline, such as urinating inappropriately, becoming very irritable and moody. He is still walking around the home with cane assist.  Decadron has been discontinued. Continues to experience dense left sided weakness with the arm.    H+P (09/24/20) Patient presents to clinic today for clinical review and treatment planning following repeat craniotomy last week.  During rehabilitation stay he developed soft tissue infection, [facility]who performed craniotomy and washout.  He was initially placed on IV antibiotics (cefepime), but was  transitioned to ciprofloxacin upon discharge, which he is taking currently.  He continues to complain of dense left sided weakness, modestly improved from prior.  He has been back working (from home) mostly using telephone and computer.  Does complain of mild fatigue and has been napping daily.      Decadron 12/17/20: 47m  Medications: Current Outpatient Medications on File Prior to Visit  Medication Sig Dispense Refill   Acetaminophen 500 MG capsule Take 1,000 mg by mouth 2 (two) times daily.     dexamethasone (DECADRON) 1 MG tablet TAKE 3 TABLETS(3 MG) BY MOUTH DAILY 90 tablet 1   ELIQUIS 5 MG TABS tablet TAKE 1 TABLET BY MOUTH  TWICE DAILY 60 tablet 11   FLUoxetine (PROZAC) 20 MG capsule Take 20 mg by mouth daily.     levETIRAcetam (KEPPRA) 750 MG tablet Take 2 tablets (1,500 mg total) by mouth every 12 (twelve) hours. 120 tablet 3   Multiple Vitamins-Minerals (MULTIVITAMIN ADULT, MINERALS, PO)      omeprazole (PRILOSEC) 40 MG capsule      ondansetron (ZOFRAN) 8 MG tablet Take 1 tablet (8 mg total) by mouth 2 (two) times daily as needed (nausea and vomiting). May take 30-60 minutes prior to Temodar administration if nausea/vomiting occurs. 30 tablet 1   temozolomide (TEMODAR) 180 MG capsule Take 1 capsule (180 mg total) by mouth daily. May take on an empty stomach to decrease nausea & vomiting. 5 capsule 0   temozolomide (TEMODAR) 250 MG capsule Take 1 capsule (250 mg total) by mouth daily. May take on an empty stomach to decrease nausea &  vomiting. 5 capsule 0   No current facility-administered medications on file prior to visit.    Allergies:  Allergies  Allergen Reactions   Nuedexta [Dextromethorphan-Quinidine]     Psychosis    Penicillins Other (See Comments)    Childhood reaction - patient is unsure of what happened.   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    Past Surgical History:  Past Surgical History:  Procedure Laterality Date   IR IVC FILTER PLMT / S&I /IMG GUID/MOD SED  08/29/2020   ROTATOR CUFF REPAIR Left 2015   Social History:  Social History   Socioeconomic History   Marital status: Married    Spouse name: Not on file   Number of children: Not on file   Years of education: Not on file   Highest education level: Not on file  Occupational History   Not on file  Tobacco Use   Smoking status: Former    Packs/day: 0.66    Years: 30.00    Pack years: 19.80    Types: Cigarettes    Quit date: 06/18/2020    Years since quitting: 0.9   Smokeless tobacco: Never  Substance and Sexual Activity   Alcohol use: No    Alcohol/week: 0.0 standard drinks   Drug use: No   Sexual activity: Yes  Other Topics Concern   Not on file  Social History Narrative   Not on file   Social Determinants of Health   Financial Resource Strain: Not on file  Food Insecurity: Not on file  Transportation Needs: Not on file  Physical Activity: Not on file  Stress: Not on file  Social Connections: Not on file  Intimate Partner Violence: Not on file   Family History:  Family History  Problem Relation Age of Onset   Hyperlipidemia Mother    Heart disease Mother    Hypertension Mother    Diabetes Mother    Alcohol abuse Father    Heart disease Father    Hypertension Father    Diabetes Sister    Cancer Neg Hx    Stroke Neg Hx     Review of Systems: Constitutional: Doesn't report fevers, chills or abnormal weight loss Eyes: Doesn't report blurriness of vision Ears, nose, mouth, throat, and face: Doesn't report sore throat Respiratory: Doesn't report cough, dyspnea or wheezes Cardiovascular: Doesn't report palpitation, chest discomfort  Gastrointestinal:  Doesn't report nausea, constipation, diarrhea GU: Doesn't report incontinence Skin: Doesn't report skin rashes Neurological: Per HPI Musculoskeletal: Doesn't report joint  pain Behavioral/Psych: Doesn't report anxiety  Physical Exam: Vitals:   05/13/21 1146  BP: (!) 141/113  Pulse: (!) 115  Resp: 18  Temp: 99.8 F (37.7 C)  SpO2: 97%    KPS: 60. General: Alert, cooperative, pleasant, in no acute distress Head: Craniectomy defect EENT: No conjunctival injection or scleral icterus.  Lungs: Resp effort normal Cardiac: Regular rate Abdomen: Non-distended abdomen Skin: No rashes cyanosis or petechiae. Extremities: ++edema LLE.  Left arm in a cast  Neurologic Exam: Mental Status: Awake, alert, attentive to examiner. Oriented to self and environment. Language is fluent with intact comprehension.  Cranial Nerves: Visual acuity is grossly normal. Visual fields are full. Extra-ocular movements intact. No ptosis. Face is symmetric Motor: Tone and bulk are normal. Left arm is 4/5 proximally, 2/5 distally in hand, left leg is 4/5. Reflexes are spastic, no  pathologic reflexes present.  Sensory: Intact to light touch Gait: Deferred   Labs: I have reviewed the data as listed    Component Value Date/Time   NA 137 04/01/2021 0900   NA 137 07/14/2013 1031   K 3.5 04/01/2021 0900   K 3.8 07/14/2013 1031   CL 103 04/01/2021 0900   CL 107 07/14/2013 1031   CO2 24 04/01/2021 0900   CO2 25 07/14/2013 1031   GLUCOSE 99 04/01/2021 0900   GLUCOSE 125 (H) 07/14/2013 1031   BUN 11 04/01/2021 0900   BUN 9 07/14/2013 1031   CREATININE 0.78 04/01/2021 0900   CREATININE 0.98 07/14/2013 1031   CALCIUM 8.5 (L) 04/01/2021 0900   CALCIUM 8.7 07/14/2013 1031   PROT 6.4 (L) 04/01/2021 0900   ALBUMIN 3.3 (L) 04/01/2021 0900   AST 18 04/01/2021 0900   ALT 26 04/01/2021 0900   ALKPHOS 42 04/01/2021 0900   BILITOT 0.7 04/01/2021 0900   GFRNONAA >60 04/01/2021 0900   GFRNONAA >60 07/14/2013 1031   GFRAA >60 07/14/2013 1031   Lab Results  Component Value Date   WBC 7.1 04/01/2021   NEUTROABS 5.1 04/01/2021   HGB 12.7 (L) 04/01/2021   HCT 37.6 (L) 04/01/2021    MCV 100.0 04/01/2021   PLT 216 04/01/2021     Assessment/Plan Glioblastoma multiforme of brain ([place]) [C71.9]  Todd Villa is clinically stable today, now having completed cycle #4 of adjuvant Temozolomide.    We recommended continuing treatment with an cycle #5 Temozolomide 242m/m2, on for five days and off for twenty three days in twenty eight day cycles. The patient will have a complete blood count performed on days 21 and 28 of each cycle, and a comprehensive metabolic panel performed on day 28 of each cycle. Labs may need to be performed more often. Zofran will prescribed for home use for nausea/vomiting.   Chemotherapy should be held for the following:  ANC less than 1,000  Platelets less than 100,000  LFT or creatinine greater than 2x ULN  If clinical concerns/contraindications develop  For mood symptoms, he is agreeable with transition from KLurayto Lamictal as follows: -Lamictal 557mdaily and Keppra 75036mID x1 week -Then Lamictal 100m33mily and Keppra 750mg92m x1 week -Then increase lamictal to 100mg 62mand stop keppra  Will con't Eliquis 5mg BI43m For leg edema 2/2 underactivity, ok with lasix 20mg BI76mN.    We ask that Todd EARLEY GROBEto clinic in 1 months prior to cycle #6 with MRI brain for evaluation, or sooner as needed.  All questions were answered. The patient knows to call the clinic with any problems, questions or concerns. No barriers to learning were detected.  The total time spent in the encounter was 40 minutes and more than 50% was on counseling and review of test results   Arlie Posch Ventura Sellersical Director of Neuro-Oncology [facility] [place] 11:34 AM

## 2021-05-13 NOTE — Progress Notes (Signed)
Nutrition Follow-up:  Patient with glioblastoma.  Patient on temozolomide.    Met with patient and wife following MD appointment.  Wife reports that appetite has been decreased recently and patient eating about 1 meal a day.  Wife says that patient is not drinking protein shakes.  Patient reports increase in nausea/queasy feeling.  Also increase fatigue  Patient with recent fall and left proximal humerus fracture, wearing sling.      Medications: reviewed  Labs: reviewed  Anthropometrics:   Weight 310 lb today (swelling)  303 lb on 10/21 300 lb on 9/23 327 lb on 3/8   NUTRITION DIAGNOSIS: Inadequate oral intake continues   INTERVENTION:  Encouraged taking nausea medication to help control symptoms of nausea Encouraged good sources of protein Encouraged if not eating much solid food to increase intake of protein shakes.   Encouraged setting reminder/alarm to eat more frequently.    MONITORING, EVALUATION, GOAL: weight trends, intake   NEXT VISIT: Jan 6 after MD visit  Todd Villa, [place], [place] Registered Dietitian [phone] (mobile)

## 2021-05-13 NOTE — Progress Notes (Signed)
Radiation Oncology Follow up Note  Name: Todd Villa   Date:   05/13/2021 MRN:  [id number] DOB: July 22, 1970    This 50 y.o. male presents to the clinic today for 38-month follow-up status post external beam radiation therapy for right temporal GBM status postresection.  REFERRING PROVIDER: Donnamarie Rossetti,*  HPI: Patient is a 50 year old male now out 6 months having completed external beam IMRT radiation therapy for a right temporal GBM status postresection..  Patient is also received concurrent Temodar and is currently on consolidation Temozolomide.  He is not on steroids at this time.  MRI scan in October which I reviewed shows unchanged enhancing masses about the right frontal parietal resection cavity unchanged surrounding T2 hyperintensity.  He is clinically stable although reports that he is having some headaches.  He states his left-sided weakness is not improved may have slightly worsened.  He is under close care and treatment with Dr. Mickeal Skinner  COMPLICATIONS OF TREATMENT: none  FOLLOW UP COMPLIANCE: keeps appointments   PHYSICAL EXAM:  BP (!) 136/105   Pulse (!) 109   Temp 99.6 F (37.6 C) (Tympanic)  Left-sided weakness is appears unchanged.  Crude visual fields within normal range.  He is wheelchair-bound.  Well-developed well-nourished patient in NAD. HEENT reveals PERLA, EOMI, discs not visualized.  Oral cavity is clear. No oral mucosal lesions are identified. Neck is clear without evidence of cervical or supraclavicular adenopathy. Lungs are clear to A&P. Cardiac examination is essentially unremarkable with regular rate and rhythm without murmur rub or thrill. Abdomen is benign with no organomegaly or masses noted. Motor sensory and DTR levels are equal and symmetric in the upper and lower extremities. Cranial nerves II through XII are grossly intact. Proprioception is intact. No peripheral adenopathy or edema is identified. No motor or sensory levels are noted. Crude  visual fields are within normal range.  RADIOLOGY RESULTS: MRI scans reviewed compatible with above-stated findings  PLAN: Present time patient appears clinically stable he continues onTemozolomide under Dr. Renda Rolls direction.  He will be seeing him today.  At this time I will continue to observe the patient with asked to see him back in 6 months for follow-up.  Be happy to reevaluate the patient in time should further radiation oncology consultation be indicated.  I would like to take this opportunity to thank you for allowing me to participate in the care of your patient.Noreene Filbert, MD

## 2021-05-13 NOTE — Progress Notes (Signed)
Pt here for follow-up prior to next cycle of chemotherapy. Pt reports that he fell a week ago in his yard and broke his humerus. He did not have surgery. Is currently in an immobilizer. He is also concerned that he still needs steroids. Has been off since 11/20. He also reports intermittent headaches, occasional nausea, and a cough at night.

## 2021-05-16 ENCOUNTER — Other Ambulatory Visit: Payer: Self-pay | Admitting: Internal Medicine

## 2021-05-17 ENCOUNTER — Other Ambulatory Visit: Payer: Self-pay | Admitting: Radiation Therapy

## 2021-05-18 ENCOUNTER — Other Ambulatory Visit: Payer: Self-pay

## 2021-05-18 ENCOUNTER — Encounter: Payer: Self-pay | Admitting: Internal Medicine

## 2021-05-18 ENCOUNTER — Other Ambulatory Visit: Payer: BC Managed Care – PPO | Admitting: Student

## 2021-05-18 DIAGNOSIS — S42202D Unspecified fracture of upper end of left humerus, subsequent encounter for fracture with routine healing: Secondary | ICD-10-CM

## 2021-05-18 DIAGNOSIS — C719 Malignant neoplasm of brain, unspecified: Secondary | ICD-10-CM

## 2021-05-18 DIAGNOSIS — Z515 Encounter for palliative care: Secondary | ICD-10-CM

## 2021-05-18 DIAGNOSIS — I10 Essential (primary) hypertension: Secondary | ICD-10-CM

## 2021-05-18 DIAGNOSIS — R5383 Other fatigue: Secondary | ICD-10-CM

## 2021-05-18 DIAGNOSIS — R600 Localized edema: Secondary | ICD-10-CM

## 2021-05-18 DIAGNOSIS — R531 Weakness: Secondary | ICD-10-CM

## 2021-05-18 NOTE — Progress Notes (Signed)
[facility] Palliative Care Consult Note Telephone: [phone]  Fax: [phone]    Date of encounter: 05/18/21 9:47 AM PATIENT NAME: Todd Villa [address] [place] [address]   [phone] (home) [phone] (work) DOB: 09/30/1970 MRN: [id number] PRIMARY CARE PROVIDER:    Donnamarie Rossetti, PA-C,  [place] [address] [phone]  REFERRING PROVIDER:   Donnamarie Rossetti, PA-C [address] [place],  [place] [address] [phone]  RESPONSIBLE PARTY:    Contact Information     Name Relation Home Work [place] [phone]        I met face to face with patient in the home. Wife via telephone. Palliative Care was asked to follow this patient by consultation request of  Venetia Maxon, Rolanda Jay,* to address advance care planning and complex medical decision making. This is a follow up visit.                                   ASSESSMENT AND PLAN / RECOMMENDATIONS:   Advance Care Planning/Goals of Care: Goals include to maximize quality of life and symptom management. Our advance care planning conversation included a discussion about:    The value and importance of advance care planning  Experiences with loved ones who have been seriously ill or have died  Exploration of personal, cultural or spiritual beliefs that might influence medical decisions  Exploration of goals of care in the event of a sudden injury or illness  Decision not to resuscitate or to de-escalate disease focused treatments due to poor prognosis. CODE STATUS: Full Code  Symptom Management/Plan:  Glioblastoma-continue Temodar as directed per Oncologist plan of care. Follow up with oncology as scheduled.   Left humerus fracture-continue to wear immobilizer, follow up with orthopedics as scheduled. Pain-continue acetaminophen routinely, oxycodone PRN pain.    Fatigue-improved since starting back on dexamethasone; continue as directed.  Generalized weakness- left sided hemiparesis. Continue HH PT/OT. Assistive device for ambulation.   Hypertension-blood pressure elevated today; patient and wife states it has been running higher since recent fracture. Discussed his pain management; medications are given sparingly due to fall risk. Encouraged to give routinely. Patient to take furosemide, wife to check blood pressure and follow up with PCP on if blood pressure medication needs to be restarted.   LE edema-continue furosemide 33m BID PRN. Encourage use of urinal or condom catheter.   Follow up Palliative Care Visit: Palliative care will continue to follow for complex medical decision making, advance care planning, and clarification of goals. Return in 6-8 weeks or prn.  I spent 45 minutes providing this consultation. More than 50% of the time in this consultation was spent in counseling and care coordination.   PPS: 50%  HOSPICE ELIGIBILITY/DIAGNOSIS: TBD  Chief Complaint: Palliative Medicine follow up visit.   HISTORY OF PRESENT ILLNESS:  Todd MCCAMISHis a 50y.o. year old male  with  glioblastoma multiforme of brain; patient is status post craniotomy, resection of right temporal mass. Diagnoses also include hypertension, obesity, CVA, seizures.   Patient states he is doing okay. He did have a fall about 2 weeks ago, resulting in left humerus fracture. He is wearing an immobilizer. He is receiving PT, OT currently on hold. He was recently switched from keppra to Lamictal due to mood/behavioral changes. He reports occasional dull headaches; had vision checked  recently. He is using oxycodone PRN for pain; he is also taking acetaminophen routinely. He does have edema to LLE. Wife does not give his furosemide during the day due to his impaired mobility. A 10-point review of systems is negative, except for the pertinent positives and negatives  detailed in the HPI.    History obtained from review of EMR, discussion with primary team, and interview with family, facility staff/caregiver and/or Todd Villa.  I reviewed available labs, medications, imaging, studies and related documents from the EMR.  Records reviewed and summarized above.    Physical Exam:  Pulse 78, resp 16, b/p 160/100, sats 96% on room air Constitutional: NAD General: frail appearing, obese  EYES: anicteric sclera, lids intact, no discharge  ENMT: intact hearing, oral mucous membranes moist, dentition intact CV: S1S2, RRR, 2+LLE edema Pulmonary: LCTA, no increased work of breathing, no cough, room air Abdomen:  normo-active BS + 4 quadrants, soft and non tender, no ascites GU: deferred MSK:  moves all extremities, ambulatory Skin: warm and dry, no rashes or wounds on visible skin Neuro: generalized weakness,  A & O x 3, forgetful Psych: non-anxious affect, pleasant Hem/lymph/immuno: no widespread bruising   Thank you for the opportunity to participate in the care of Todd Villa.  The palliative care team will continue to follow. Please call our office at [phone] if we can be of additional assistance.   Ezekiel Slocumb, NP   COVID-19 PATIENT SCREENING TOOL Asked and negative response unless otherwise noted:   Have you had symptoms of covid, tested positive or been in contact with someone with symptoms/positive test in the past 5-10 days? No

## 2021-05-25 ENCOUNTER — Encounter: Payer: Self-pay | Admitting: Hospice and Palliative Medicine

## 2021-05-26 ENCOUNTER — Telehealth: Payer: Self-pay | Admitting: Student

## 2021-05-26 NOTE — Telephone Encounter (Signed)
Palliative NP returned call to patient's wife regarding his blood pressure. He is to see his PCP on Tuesday. He is only taking his furosemide as needed. She is instructed to give daily and he will f/u with PCP as scheduled. She will continue to check BP.

## 2021-05-31 ENCOUNTER — Other Ambulatory Visit
Admission: RE | Admit: 2021-05-31 | Discharge: 2021-05-31 | Disposition: A | Discharge status: Home / Self Care | Payer: BC Managed Care – PPO | Source: Ambulatory Visit | Attending: Family Medicine | Admitting: Family Medicine

## 2021-05-31 DIAGNOSIS — R600 Localized edema: Secondary | ICD-10-CM | POA: Insufficient documentation

## 2021-05-31 LAB — BRAIN NATRIURETIC PEPTIDE: B Natriuretic Peptide: 43.8 pg/mL (ref 0.0–100.0)

## 2021-06-02 ENCOUNTER — Other Ambulatory Visit: Payer: Self-pay | Admitting: Internal Medicine

## 2021-06-02 DIAGNOSIS — C719 Malignant neoplasm of brain, unspecified: Secondary | ICD-10-CM

## 2021-06-02 NOTE — Telephone Encounter (Signed)
Patient has to have Labs and see provider before refilling.  Patient is already scheduled for 06/17/2021

## 2021-06-05 ENCOUNTER — Encounter: Payer: Self-pay | Admitting: Internal Medicine

## 2021-06-07 ENCOUNTER — Other Ambulatory Visit: Payer: Self-pay | Admitting: Internal Medicine

## 2021-06-07 MED ORDER — LAMOTRIGINE 100 MG PO TABS: ORAL_TABLET | ORAL | 3 refills | Status: DC

## 2021-06-09 ENCOUNTER — Ambulatory Visit
Admission: RE | Admit: 2021-06-09 | Discharge: 2021-06-09 | Disposition: A | Discharge status: Home / Self Care | Payer: BC Managed Care – PPO | Source: Ambulatory Visit | Attending: Internal Medicine | Admitting: Internal Medicine

## 2021-06-09 ENCOUNTER — Other Ambulatory Visit: Payer: Self-pay

## 2021-06-09 DIAGNOSIS — C719 Malignant neoplasm of brain, unspecified: Secondary | ICD-10-CM | POA: Diagnosis not present

## 2021-06-09 MED ORDER — GADOBUTROL 1 MMOL/ML IV SOLN
10.0000 mL | Freq: Once | INTRAVENOUS | Status: AC | PRN
  Administered 2021-06-09: 16:00:00 10 mL via INTRAVENOUS

## 2021-06-15 ENCOUNTER — Telehealth: Payer: Self-pay

## 2021-06-15 NOTE — Telephone Encounter (Signed)
-----   Message from Ventura Sellers, MD sent at 06/15/2021  2:28 PM EST ----- Regarding: RE: OT Ok ----- Message ----- From: Rondel Baton, LPN Sent: 0/11/2374   2:28 PM EST To: Ventura Sellers, MD Subject: OT                                             T/C from [facility] with [facility] requesting a verbal order to continue OT once a week for 5 weeks.

## 2021-06-15 NOTE — Telephone Encounter (Signed)
LM with Marlowe Kays for Verbal order

## 2021-06-17 ENCOUNTER — Encounter: Payer: Self-pay | Admitting: Internal Medicine

## 2021-06-17 ENCOUNTER — Inpatient Hospital Stay: Payer: BC Managed Care – PPO | Attending: Internal Medicine

## 2021-06-17 ENCOUNTER — Inpatient Hospital Stay (HOSPITAL_BASED_OUTPATIENT_CLINIC_OR_DEPARTMENT_OTHER): Payer: BC Managed Care – PPO | Admitting: Internal Medicine

## 2021-06-17 ENCOUNTER — Other Ambulatory Visit: Payer: Self-pay

## 2021-06-17 ENCOUNTER — Inpatient Hospital Stay: Payer: BC Managed Care – PPO

## 2021-06-17 VITALS — BP 148/99 | HR 86 | Resp 16 | Wt 331.2 lb

## 2021-06-17 DIAGNOSIS — R569 Unspecified convulsions: Secondary | ICD-10-CM

## 2021-06-17 DIAGNOSIS — Z7901 Long term (current) use of anticoagulants: Secondary | ICD-10-CM | POA: Diagnosis not present

## 2021-06-17 DIAGNOSIS — C719 Malignant neoplasm of brain, unspecified: Secondary | ICD-10-CM | POA: Diagnosis not present

## 2021-06-17 DIAGNOSIS — Z811 Family history of alcohol abuse and dependence: Secondary | ICD-10-CM | POA: Diagnosis not present

## 2021-06-17 DIAGNOSIS — R531 Weakness: Secondary | ICD-10-CM | POA: Diagnosis not present

## 2021-06-17 DIAGNOSIS — Z8673 Personal history of transient ischemic attack (TIA), and cerebral infarction without residual deficits: Secondary | ICD-10-CM | POA: Insufficient documentation

## 2021-06-17 DIAGNOSIS — Z79899 Other long term (current) drug therapy: Secondary | ICD-10-CM | POA: Diagnosis not present

## 2021-06-17 DIAGNOSIS — C712 Malignant neoplasm of temporal lobe: Secondary | ICD-10-CM | POA: Insufficient documentation

## 2021-06-17 DIAGNOSIS — Z8249 Family history of ischemic heart disease and other diseases of the circulatory system: Secondary | ICD-10-CM | POA: Diagnosis not present

## 2021-06-17 DIAGNOSIS — Z880 Allergy status to penicillin: Secondary | ICD-10-CM | POA: Diagnosis not present

## 2021-06-17 DIAGNOSIS — R5383 Other fatigue: Secondary | ICD-10-CM | POA: Insufficient documentation

## 2021-06-17 DIAGNOSIS — Z833 Family history of diabetes mellitus: Secondary | ICD-10-CM | POA: Diagnosis not present

## 2021-06-17 DIAGNOSIS — Z79630 Long term (current) use of alkylating agent: Secondary | ICD-10-CM | POA: Insufficient documentation

## 2021-06-17 DIAGNOSIS — Z8349 Family history of other endocrine, nutritional and metabolic diseases: Secondary | ICD-10-CM | POA: Diagnosis not present

## 2021-06-17 LAB — COMPREHENSIVE METABOLIC PANEL
ALT: 22 U/L (ref 0–44)
AST: 15 U/L (ref 15–41)
Albumin: 3.6 g/dL (ref 3.5–5.0)
Alkaline Phosphatase: 43 U/L (ref 38–126)
Anion gap: 9 (ref 5–15)
BUN: 13 mg/dL (ref 6–20)
CO2: 26 mmol/L (ref 22–32)
Calcium: 8.8 mg/dL — ABNORMAL LOW (ref 8.9–10.3)
Chloride: 102 mmol/L (ref 98–111)
Creatinine, Ser: 0.93 mg/dL (ref 0.61–1.24)
GFR, Estimated: >60 mL/min (ref >60)
Glucose, Bld: 103 mg/dL — ABNORMAL HIGH (ref 70–99)
Potassium: 3.4 mmol/L — ABNORMAL LOW (ref 3.5–5.1)
Sodium: 137 mmol/L (ref 135–145)
Total Bilirubin: 0.5 mg/dL (ref 0.3–1.2)
Total Protein: 6.8 g/dL (ref 6.5–8.1)

## 2021-06-17 LAB — CBC WITH DIFFERENTIAL/PLATELET
Abs Immature Granulocytes: 0.13 10*3/uL — ABNORMAL HIGH (ref 0.00–0.07)
Basophils Absolute: 0 10*3/uL (ref 0.0–0.1)
Basophils Relative: 0 %
Eosinophils Absolute: 0.1 10*3/uL (ref 0.0–0.5)
Eosinophils Relative: 1 %
HCT: 40.2 % (ref 39.0–52.0)
Hemoglobin: 13.5 g/dL (ref 13.0–17.0)
Immature Granulocytes: 1 %
Lymphocytes Relative: 14 %
Lymphs Abs: 1.6 10*3/uL (ref 0.7–4.0)
MCH: 33.8 pg (ref 26.0–34.0)
MCHC: 33.6 g/dL (ref 30.0–36.0)
MCV: 100.5 fL — ABNORMAL HIGH (ref 80.0–100.0)
Monocytes Absolute: 1.1 10*3/uL — ABNORMAL HIGH (ref 0.1–1.0)
Monocytes Relative: 10 %
Neutro Abs: 8 10*3/uL — ABNORMAL HIGH (ref 1.7–7.7)
Neutrophils Relative %: 74 %
Platelets: 284 10*3/uL (ref 150–400)
RBC: 4 MIL/uL — ABNORMAL LOW (ref 4.22–5.81)
RDW: 12.8 % (ref 11.5–15.5)
WBC: 10.8 10*3/uL — ABNORMAL HIGH (ref 4.0–10.5)
nRBC: 0 % (ref 0.0–0.2)

## 2021-06-17 NOTE — Progress Notes (Signed)
[facility] [place], [place] [address] [phone]   Interval Evaluation  Date of Service: 06/17/21 Patient Name: Todd Villa Patient MRN: [id number] Patient DOB: [birth year] Provider: Ventura Sellers, MD  Identifying Statement:  Todd Villa is a 51 y.o. 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 Lacinda Axon; path demonstrates Glioblastoma IDH-1 wild type   09/16/2020 Surgery   Wound flap washout with Dr. Lacinda Axon; collection speciates proteus.  PICC line placed for cefepime therapy.   10/25/2020 - 12/03/2020 Radiation Therapy   IMRT and concurrent Temodar 765m/m2   01/07/2021 -  Chemotherapy   Patient is on Treatment Plan : BRAIN GLIOBLASTOMA Consolidation Temozolomide Days 1-5 q28 Days        Biomarkers:  MGMT Unknown.  IDH 1/2 Wild type.  EGFR Unknown  TERT Unknown   Interval History:  Todd Villa today for follow up after recent MRI brain, now having completed cycle #5 of (dose escalated) 5-day Temodar.  Tolerated treatment well again without nausea.  Arm still in a sling, due for xray in a couple of weeks.  Behavioral issues have improved with Lamictal, now on 2070m100mg after recent seizure on christmas. He is still walking around the home with cane assist.  Using lasix as needed for leg swelling. Continues to experience dense left sided weakness with the arm.    H+P (09/24/20) Patient presents to clinic today for clinical review and treatment planning following repeat craniotomy last week.  During rehabilitation stay he developed soft tissue infection, [facility] performed craniotomy and washout.  He was initially placed on IV antibiotics (cefepime), but was transitioned to ciprofloxacin upon discharge, which he is  taking currently.  He continues to complain of dense left sided weakness, modestly improved from prior.  He has been back working (from home) mostly using telephone and computer.  Does complain of mild fatigue and has been napping daily.      Decadron 12/17/20: 65m57m Medications: Current Outpatient Medications on File Prior to Visit  Medication Sig Dispense Refill   Acetaminophen 500 MG capsule Take 1,000 mg by mouth 2 (two) times daily.     dexamethasone (DECADRON) 1 MG tablet TAKE 3 TABLETS(3 MG) BY MOUTH DAILY 90 tablet 1   ELIQUIS 5 MG TABS tablet TAKE 1 TABLET BY MOUTH  TWICE DAILY 60 tablet 11   furosemide (LASIX) 20 MG tablet Take 1 tablet (20 mg total) by mouth 2 (two) times daily as needed for edema. 60 tablet 1   lamoTRIgine (LAMICTAL) 100 MG tablet Take 2 tablets (200 mg total) by mouth daily AND 1 tablet (100 mg total) at bedtime. 60 tablet 3   omeprazole (PRILOSEC) 40 MG capsule      temozolomide (TEMODAR) 180 MG capsule Take 1 capsule (180 mg total) by mouth daily. May take on an empty stomach to decrease nausea & vomiting. 5 capsule 0   temozolomide (TEMODAR) 250 MG capsule Take 1 capsule (250 mg total) by mouth daily. May take on an empty stomach to decrease nausea & vomiting. 5 capsule 0   FLUoxetine (PROZAC) 20 MG capsule Take 20 mg by mouth daily.     levETIRAcetam (KEPPRA) 750 MG tablet TAKE 2 TABLETS BY MOUTH  EVERY 12 HOURS 360 tablet 3  Multiple Vitamins-Minerals (MULTIVITAMIN ADULT, MINERALS, PO)      ondansetron (ZOFRAN) 8 MG tablet Take 1 tablet (8 mg total) by mouth 2 (two) times daily as needed (nausea and vomiting). May take 30-60 minutes prior to Temodar administration if nausea/vomiting occurs. 30 tablet 1   temozolomide (TEMODAR) 180 MG capsule Take 1 capsule (180 mg total) by mouth daily. May take on an empty stomach to decrease nausea & vomiting. 5 capsule 0   temozolomide (TEMODAR) 250 MG capsule Take 1 capsule (250 mg total) by mouth daily. May take on an  empty stomach to decrease nausea & vomiting. 5 capsule 0   No current facility-administered medications on file prior to visit.    Allergies:  Allergies  Allergen Reactions   Nuedexta [Dextromethorphan-Quinidine]     Psychosis    Penicillins Other (See Comments)    Childhood reaction - patient is unsure of what happened.   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   Past Surgical History:  Past Surgical History:  Procedure Laterality Date   IR IVC FILTER PLMT / S&I /IMG GUID/MOD SED  08/29/2020   ROTATOR CUFF REPAIR Left 2015   Social History:  Social History   Socioeconomic History   Marital status: Married    Spouse name: Not on file   Number of children: Not on file   Years of education: Not on file   Highest education level: Not on file  Occupational History   Not on file  Tobacco Use   Smoking status: Former    Packs/day: 0.66    Years: 30.00    Pack years: 19.80    Types: Cigarettes    Quit date: 06/18/2020    Years since quitting: 0.9   Smokeless tobacco: Never  Substance and Sexual Activity   Alcohol use: No    Alcohol/week: 0.0 standard drinks   Drug use: No   Sexual activity: Yes  Other Topics Concern   Not on file  Social History Narrative   Not on file   Social Determinants of Health   Financial Resource Strain: Not on file  Food Insecurity: Not on file  Transportation Needs: Not on file  Physical Activity: Not on file  Stress: Not on file  Social Connections: Not on file  Intimate Partner Violence: Not on file   Family History:  Family History  Problem Relation Age of Onset   Hyperlipidemia Mother    Heart disease Mother    Hypertension Mother    Diabetes Mother    Alcohol abuse Father    Heart disease Father    Hypertension Father    Diabetes Sister    Cancer Neg Hx    Stroke Neg Hx     Review of  Systems: Constitutional: Doesn't report fevers, chills or abnormal weight loss Eyes: Doesn't report blurriness of vision Ears, nose, mouth, throat, and face: Doesn't report sore throat Respiratory: Doesn't report cough, dyspnea or wheezes Cardiovascular: Doesn't report palpitation, chest discomfort  Gastrointestinal:  Doesn't report nausea, constipation, diarrhea GU: Doesn't report incontinence Skin: Doesn't report skin rashes Neurological: Per HPI Musculoskeletal: Doesn't report joint pain Behavioral/Psych: Doesn't report anxiety  Physical Exam: Vitals:   06/17/21 1116  BP: (!) 148/99  Pulse: 86  Resp: 16  SpO2: 99%    KPS: 60. General: Alert, cooperative, pleasant, in no acute distress Head: Craniectomy defect EENT:  No conjunctival injection or scleral icterus.  Lungs: Resp effort normal Cardiac: Regular rate Abdomen: Non-distended abdomen Skin: No rashes cyanosis or petechiae. Extremities: ++edema LLE.  Left arm in a cast  Neurologic Exam: Mental Status: Awake, alert, attentive to examiner. Oriented to self and environment. Language is fluent with intact comprehension.  Cranial Nerves: Visual acuity is grossly normal. Visual fields are full. Extra-ocular movements intact. No ptosis. Face is symmetric Motor: Tone and bulk are normal. Left arm is 4/5 proximally, 2/5 distally in hand, left leg is 4/5. Reflexes are spastic, no pathologic reflexes present.  Sensory: Intact to light touch Gait: Deferred   Labs: I have reviewed the data as listed    Component Value Date/Time   NA 140 05/13/2021 1112   NA 137 07/14/2013 1031   K 3.4 (L) 05/13/2021 1112   K 3.8 07/14/2013 1031   CL 101 05/13/2021 1112   CL 107 07/14/2013 1031   CO2 27 05/13/2021 1112   CO2 25 07/14/2013 1031   GLUCOSE 104 (H) 05/13/2021 1112   GLUCOSE 125 (H) 07/14/2013 1031   BUN 8 05/13/2021 1112   BUN 9 07/14/2013 1031   CREATININE 0.89 05/13/2021 1112   CREATININE 0.98 07/14/2013 1031   CALCIUM  8.8 (L) 05/13/2021 1112   CALCIUM 8.7 07/14/2013 1031   PROT 6.6 05/13/2021 1112   ALBUMIN 3.4 (L) 05/13/2021 1112   AST 17 05/13/2021 1112   ALT 26 05/13/2021 1112   ALKPHOS 59 05/13/2021 1112   BILITOT 0.8 05/13/2021 1112   GFRNONAA >60 05/13/2021 1112   GFRNONAA >60 07/14/2013 1031   GFRAA >60 07/14/2013 1031   Lab Results  Component Value Date   WBC 10.8 (H) 06/17/2021   NEUTROABS 8.0 (H) 06/17/2021   HGB 13.5 06/17/2021   HCT 40.2 06/17/2021   MCV 100.5 (H) 06/17/2021   PLT 284 06/17/2021    Imaging:  [facility] Clinician Interpretation: I have personally reviewed the CNS images as listed.  My interpretation, in the context of the patient's clinical presentation, is progressive disease  MR BRAIN W WO CONTRAST  Result Date: 06/10/2021 CLINICAL DATA:  Glioblastoma, assess treatment response EXAM: MRI HEAD WITHOUT AND WITH CONTRAST TECHNIQUE: Multiplanar, multiecho pulse sequences of the brain and surrounding structures were obtained without and with intravenous contrast. CONTRAST:  45m GADAVIST GADOBUTROL 1 MMOL/ML IV SOLN COMPARISON:  03/29/2021, 03/02/2021 FINDINGS: Brain: Status post prior right frontotemporal craniectomy with subjacent resection cavity in the frontoparietal region. Around the resection cavity again noted are multiple heterogeneously enhancing masses, the largest of which is at the posterior aspect of the resection cavity and measures up to 2.8 x 4.3 cm in the axial plane (series 18, image 96), previously 2.7 x 3.5 cm when remeasured similarly. The mass at the superomedial aspect of the resection cavity measures up to 1.4 x 2.5 cm in the axial plane (series 18, image 111), previously 1.3 x 2.2 cm. The mass anterior to the resection cavity measures up to 2.0 x 2.8 cm (series 18, image 109), previously 1.7 x 2.5 cm. A somewhat separate lesion anterior to the resection cavity now measures 1.3 x 1.5 cm (series 18, image 93), previously 1.1 x 1.1 cm. A focus of contrast  enhancement anteromedial to this now appears to be a lesion measuring up to 0.3 cm (series 18, image 95), previously ill-defined contrast. Redemonstrated increased T2 hyperintense signal in the surrounding parenchyma, which appears to extend more superiorly into the vertex right frontal and parietal lobes (series 15, image  48). As well as further into the right temporal lobe (series 15, image 24). There appears to be increased herniation through the craniectomy defect (series 15, image 30, compared to series 15, image 32 from the 03/29/2021 exam and series 15, image 30 from 03/02/2021). No midline shift. No hydrocephalus or extra-axial collection. No restricted diffusion to suggest acute infarct. Vascular: Normal flow voids. Skull and upper cervical spine: Prior right frontoparietal craniectomy. Otherwise normal marrow signal. Sinuses/Orbits: Mucous retention cysts and mucosal thickening in the maxillary sinuses. The orbits are unremarkable. Other: Trace fluid in the right-greater-than-left mastoid air cells. IMPRESSION: 1. Interval increase in the size of multiple heterogeneously enhancing masses about the right frontoparietal resection cavity, with interval increase in surrounding T2 hyperintense signal, concerning for tumor progression. 2. No midline shift, however there appears to be increased herniation through the craniectomy defect. Electronically Signed   By: Merilyn Baba M.D.   On: 06/10/2021 14:22     Assessment/Plan Glioblastoma multiforme of brain ([place]) [C71.9]  Todd Villa is clinically stable today, now having completed cycle #5 of adjuvant Temozolomide.  Unfortunately, brain MRI demonstrates progression of disease, in particular within inferior aspect of resection cavity.  Taken together with prior studies, picture of recurrent disease is clear at this time.  We recommended discontinuing Temodar.  We discussed options moving forward for second line therapy, including CCNU+avastin.   CCNU would be administered orally 57m/m2 every 6 weeks, with avastin 118mkg given every 2 weeks.  We reviewed side effects of both agents, including anemia, nausa/vomiting (CCNU), hypertension and wound healing impairment (avastin).    We also discussed goals of care, and reviewed palliative/hospice as an option.  They have met previously with Dr. BoRegenia Skeeter They are willing to consider hospice rather than confront salvage chemo, which they understand has less than 50% chance of controlling the disease progression.  Ok to con't Lamictal 200/100.  Will con't Eliquis 74m69mID.  For leg edema 2/2 underactivity, ok with lasix 58m8mD PRN.    Todd Villa give us aKoreaall/message next week regarding goals of care, preferred path forward.  We are happy to continue goals of care discussion or seek guidance from Dr. BordRegenia Skeeterpreferred by patient.  All questions were answered. The patient knows to call the clinic with any problems, questions or concerns. No barriers to learning were detected.  The total time spent in the encounter was 40 minutes and more than 50% was on counseling and review of test results   ZachVentura Sellers Medical Director of Neuro-Oncology [facility]06/23 11:23 AM

## 2021-06-17 NOTE — Progress Notes (Signed)
Nutrition  RD planning to see patient following MD appointment today but patient left before RD available.    Notes reviewed from MD and patient and family to decide about continuing treatment or hospice.    RD available as needed.  Denver Harder B. Zenia Resides, [place], [place] Registered Dietitian [phone] (mobile)

## 2021-06-20 ENCOUNTER — Inpatient Hospital Stay: Payer: BC Managed Care – PPO | Attending: Internal Medicine

## 2021-06-24 ENCOUNTER — Encounter: Payer: Self-pay | Admitting: Internal Medicine

## 2021-06-24 NOTE — Telephone Encounter (Signed)
error 

## 2021-06-27 ENCOUNTER — Encounter: Payer: Self-pay | Admitting: Internal Medicine

## 2021-07-18 ENCOUNTER — Other Ambulatory Visit: Payer: Self-pay | Admitting: Internal Medicine

## 2021-07-18 ENCOUNTER — Telehealth: Payer: Self-pay | Admitting: *Deleted

## 2021-07-18 MED ORDER — OXYCODONE HCL 5 MG PO TABS: 5.0000 mg | ORAL_TABLET | ORAL | 0 refills | Status: DC | PRN

## 2021-07-18 MED ORDER — DEXAMETHASONE 4 MG PO TABS: ORAL_TABLET | ORAL | 3 refills | Status: DC

## 2021-07-18 MED ORDER — LAMOTRIGINE 100 MG PO TABS: ORAL_TABLET | ORAL | 2 refills | Status: DC

## 2021-07-18 NOTE — Telephone Encounter (Signed)
Communicated new medications to Mitzy with Hospice.

## 2021-07-18 NOTE — Telephone Encounter (Signed)
Mitzy from [employer] called [phone].  States patient is having more seizures (mainly at nighttime).  Current regimen is Lamictal 200/100.  Their hospice provider added Valium 5 mg to be given after 3 minutes of seizure activity and if not improved can be given again after 10 minutes.    Reports taking Decadron 1 mg TID.  She states that he was holding a cup of coffee when a seizure came on and he burned his abdomen pretty severely.    They are requesting Silvadene burn cream (verbal order ok for this one).  They are requesting additional seizure medication to control seizures (failed Keppra in past due to mood issues).  Wants to know if Decadron should be increased (if it would help with seizure management and headaches).    Wants to know if patient could get Oxycodone 5 mg q 4 hrs PRN for pain/headaches.  Routing to provider to review.

## 2021-08-05 ENCOUNTER — Telehealth: Payer: Self-pay

## 2021-08-05 NOTE — Telephone Encounter (Signed)
April Jones RN from [employer] called to advise that patient has had increased seizure activity. Patient had fall on 08/04/21 while experiencing tingling in hands which he says occurs prior to a seizure. Patient stated to April that he did not have a seizure at time of fall. April has spoken with Billey Chang NP who advised her that he would reach out to Dr. Mickeal Skinner to discuss current medications.  Routed to Provider

## 2021-08-10 ENCOUNTER — Other Ambulatory Visit: Payer: Self-pay | Admitting: Hospice and Palliative Medicine

## 2021-08-10 MED ORDER — LEVETIRACETAM 500 MG PO TABS: 1000.0000 mg | ORAL_TABLET | Freq: Two times a day (BID) | ORAL | 2 refills | Status: DC

## 2021-08-10 NOTE — Progress Notes (Signed)
Received call from April, hospice nurse.  They are concerned that patient is having breakthrough seizures on Lamictal.  Discussed with Dr. Mickeal Skinner and will restart Keppra 1000 mg twice daily in addition to current dose of Lamictal. ?

## 2021-08-15 ENCOUNTER — Other Ambulatory Visit: Payer: Self-pay | Admitting: Hospice and Palliative Medicine

## 2021-08-15 MED ORDER — OXYCODONE HCL 5 MG PO TABS: 5.0000 mg | ORAL_TABLET | ORAL | 0 refills | Status: DC | PRN

## 2021-10-12 ENCOUNTER — Other Ambulatory Visit: Payer: Self-pay | Admitting: Hospice and Palliative Medicine

## 2021-10-12 MED ORDER — OXYCODONE HCL 5 MG PO TABS: 5.0000 mg | ORAL_TABLET | ORAL | 0 refills | Status: DC | PRN

## 2021-10-12 NOTE — Progress Notes (Signed)
Hospice nurse, April, requested refill of oxycodone. Rx sent to pharmacy.  ?

## 2021-10-19 ENCOUNTER — Other Ambulatory Visit: Payer: Self-pay | Admitting: Hospice and Palliative Medicine

## 2021-10-19 MED ORDER — OLANZAPINE 5 MG PO TABS: 5.0000 mg | ORAL_TABLET | Freq: Every day | ORAL | 2 refills | Status: AC

## 2021-10-19 NOTE — Progress Notes (Signed)
Received call from hospice nurse, April. Patient has had some agitation and physical aggression. He ha also had difficulty sleeping. Will start on low dose olanzapine at bedtime.  ?

## 2021-10-24 ENCOUNTER — Other Ambulatory Visit: Payer: Self-pay | Admitting: *Deleted

## 2021-10-24 MED ORDER — LEVETIRACETAM 500 MG PO TABS: 1000.0000 mg | ORAL_TABLET | Freq: Two times a day (BID) | ORAL | 2 refills | Status: DC

## 2021-10-26 ENCOUNTER — Other Ambulatory Visit: Payer: Self-pay

## 2021-11-01 ENCOUNTER — Other Ambulatory Visit: Payer: Self-pay | Admitting: Hospice and Palliative Medicine

## 2021-11-01 ENCOUNTER — Other Ambulatory Visit: Payer: Self-pay | Admitting: Internal Medicine

## 2021-11-01 MED ORDER — APIXABAN 5 MG PO TABS
5.0000 mg | ORAL_TABLET | Freq: Two times a day (BID) | ORAL | 11 refills | Status: AC
Start: 2021-11-01 — End: ?

## 2021-11-08 ENCOUNTER — Other Ambulatory Visit: Payer: Self-pay | Admitting: Internal Medicine

## 2021-11-08 ENCOUNTER — Telehealth: Payer: Self-pay | Admitting: *Deleted

## 2021-11-08 MED ORDER — OXYCODONE HCL 5 MG PO TABS: 5.0000 mg | ORAL_TABLET | ORAL | 0 refills | Status: DC | PRN

## 2021-11-08 NOTE — Telephone Encounter (Signed)
Received call from [facility] with [facility].  Patient needs refill of Oxycodone 5 mg q 4 hrs PRN.  Patient has switched to new pharmacy so please send to new pharmacy.   [facility] in [place].  Routed to provider

## 2021-12-05 ENCOUNTER — Other Ambulatory Visit: Payer: Self-pay | Admitting: Internal Medicine

## 2021-12-12 ENCOUNTER — Other Ambulatory Visit: Payer: Self-pay | Admitting: Internal Medicine

## 2021-12-23 ENCOUNTER — Other Ambulatory Visit: Payer: Self-pay | Admitting: Hospice and Palliative Medicine

## 2021-12-23 MED ORDER — MORPHINE SULFATE (CONCENTRATE) 10 MG /0.5 ML PO SOLN: 5.0000 mg | ORAL | 0 refills | Status: AC | PRN

## 2021-12-23 NOTE — Progress Notes (Signed)
I spoke with hospice nurse, April. Patient is declining with poor performance status, confusion, and shortness of breath.  Hospice nurse feels like patient is likely nearing end-of-life.  She requested morphine elixir.  Rx sent to pharmacy.

## 2021-12-26 ENCOUNTER — Other Ambulatory Visit: Payer: Self-pay | Admitting: *Deleted

## 2021-12-26 ENCOUNTER — Other Ambulatory Visit: Payer: Self-pay | Admitting: Internal Medicine

## 2021-12-26 MED ORDER — POLYETHYLENE GLYCOL 3350 17 GM/SCOOP PO POWD: 1.0000 | Freq: Once | ORAL | 0 refills | Status: AC

## 2022-01-02 ENCOUNTER — Other Ambulatory Visit: Payer: Self-pay | Admitting: *Deleted

## 2022-01-02 MED ORDER — LEVETIRACETAM 500 MG PO TABS
1000.0000 mg | ORAL_TABLET | Freq: Two times a day (BID) | ORAL | 2 refills | Status: AC
Start: 2022-01-02 — End: ?

## 2022-01-16 ENCOUNTER — Other Ambulatory Visit: Payer: Self-pay | Admitting: Internal Medicine

## 2022-01-19 ENCOUNTER — Other Ambulatory Visit: Payer: Self-pay | Admitting: Hospice and Palliative Medicine

## 2022-01-19 MED ORDER — OXYCODONE HCL 5 MG PO TABS
5.0000 mg | ORAL_TABLET | ORAL | 0 refills | Status: AC | PRN
Start: 2022-01-19 — End: ?

## 2022-01-19 NOTE — Progress Notes (Signed)
Hospice nurse, April, requested refill of oxycodone. This appears to have been printed Dr. Mickeal Skinner on 8/7 but wife reports that pharmacy did not receive it.

## 2022-01-25 ENCOUNTER — Other Ambulatory Visit: Payer: Self-pay | Admitting: *Deleted

## 2022-01-25 MED ORDER — POLYETHYLENE GLYCOL 3350 17 GM/SCOOP PO POWD: 1.0000 | Freq: Every day | ORAL | 1 refills | Status: AC

## 2022-02-03 ENCOUNTER — Other Ambulatory Visit: Payer: Self-pay | Admitting: *Deleted

## 2022-02-03 MED ORDER — FUROSEMIDE 40 MG PO TABS
40.0000 mg | ORAL_TABLET | Freq: Two times a day (BID) | ORAL | 4 refills | Status: AC
Start: 2022-02-03 — End: ?

## 2022-03-05 IMAGING — MR MR HEAD WO/W CM
15 series · 47 of 48 positions shown · IV contrast (gadavist)
Comparison: 03/29/2021, 03/02/2021

CLINICAL DATA: Glioblastoma, assess treatment response

EXAM:
MRI HEAD WITHOUT AND WITH CONTRAST
TECHNIQUE: Multiplanar, multiecho pulse sequences of the brain and surrounding
structures were obtained without and with intravenous contrast.
CONTRAST:  10mL GADAVIST GADOBUTROL 1 MMOL/ML IV SOLN

[Series 5: ax dwi_tracew · axial · 3.0mm · 0.65mm/px · z∈[-72,+78]mm · 4 of 48 slices shown]
[im 1/48]
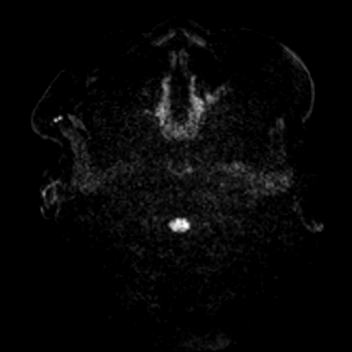
[im 16/48]
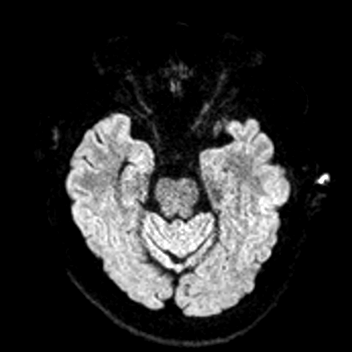
[im 32/48]
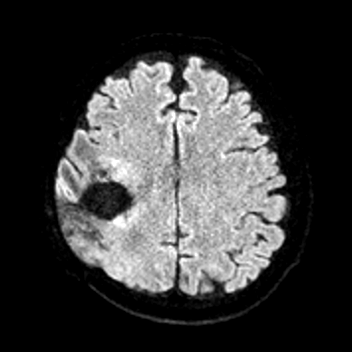
[im 48/48]
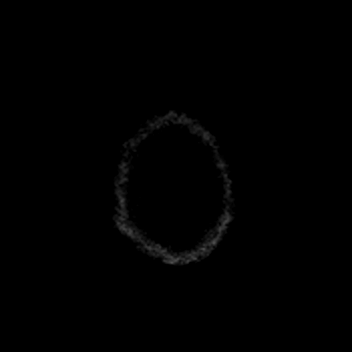

[Series 6: ax dwi_adc · axial · 3.0mm · 0.65mm/px · z∈[-72,+78]mm · 3 of 48 slices shown]
[im 1/48]
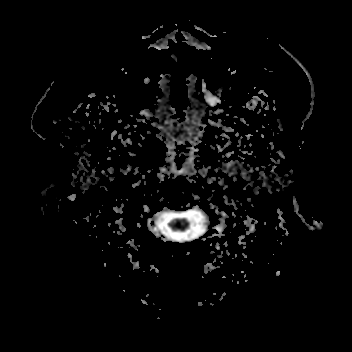
[im 24/48]
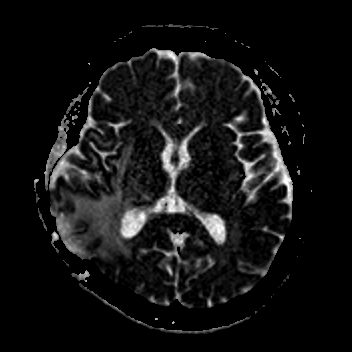
[im 48/48]
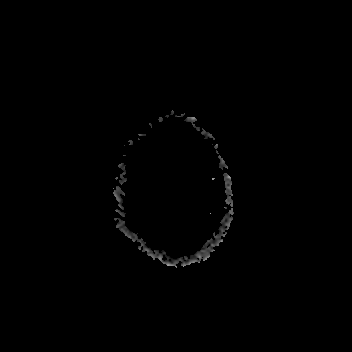

[Series 7: cor dwi_tracew · coronal · 5.0mm · 0.65mm/px · 2 of 40 slices shown]
[im 1/40]
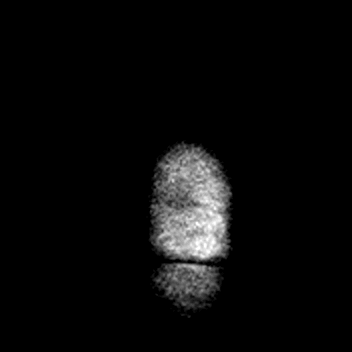
[im 40/40]
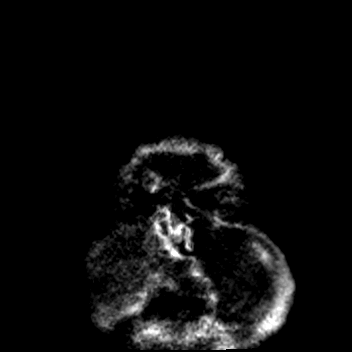

[Series 8: cor dwi_adc · coronal · 5.0mm · 0.65mm/px · 2 of 40 slices shown]
[im 1/40]
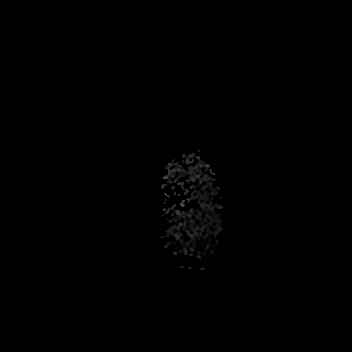
[im 40/40]
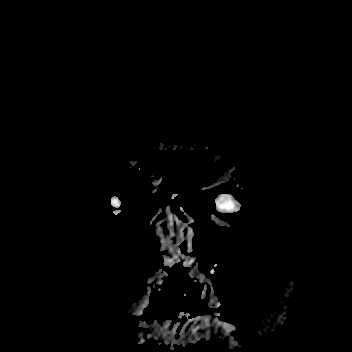

[Series 9: T1 · sagittal · 5.0mm · 0.62mm/px · 1 of 25 slices shown (1 of 2)]
[im 1/25]
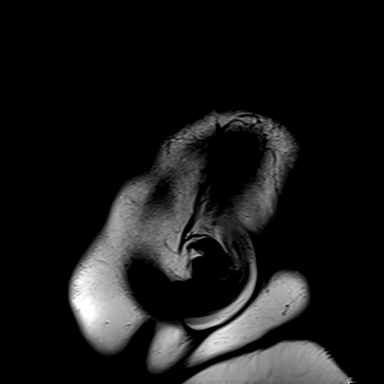

[Series 10: T2 · axial · 5.0mm · 0.53mm/px · 1 of 25 slices shown]
[im 1/25]
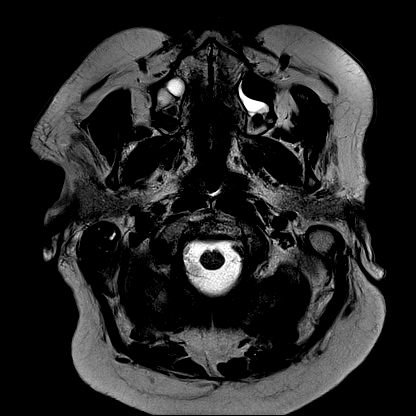

[Series 11: mag_images · axial · 3.0mm · 0.90mm/px · z∈[-84,+88]mm · 3 of 60 slices shown]
[im 1/60]
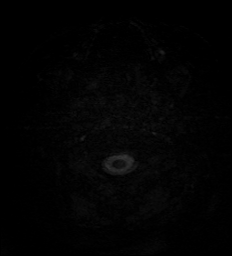
[im 30/60]
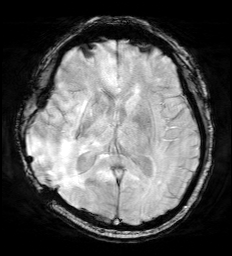
[im 60/60]
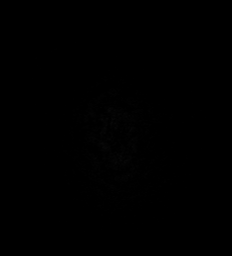

[Series 12: pha_images · axial · 3.0mm · 0.90mm/px · z∈[-84,+88]mm · 3 of 60 slices shown]
[im 1/60]
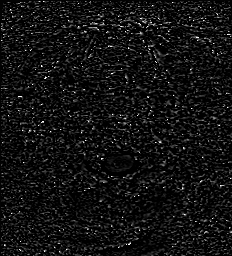
[im 30/60]
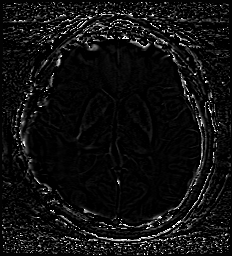
[im 60/60]
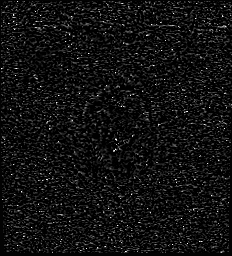

[Series 13: swi_images · axial · 3.0mm · 0.90mm/px · z∈[-84,+88]mm · 3 of 60 slices shown]
[im 1/60]
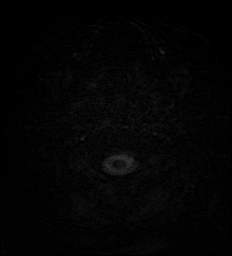
[im 30/60]
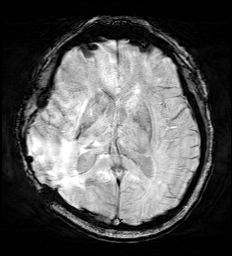
[im 60/60]
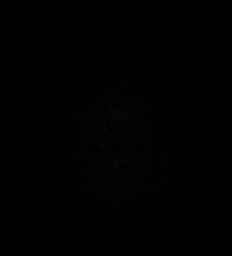

[Series 15: FLAIR · axial · 3.0mm · 0.53mm/px · z∈[-77,+80]mm · 3 of 55 slices shown]
[im 1/55]
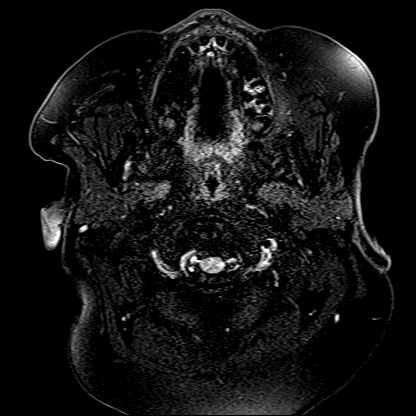
[im 28/55]
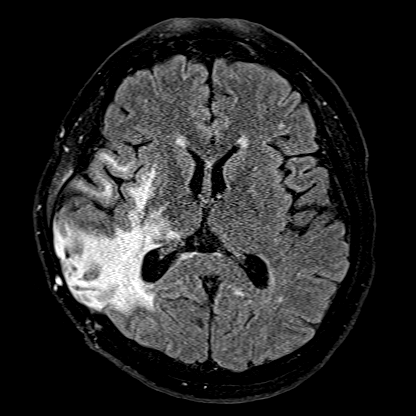
[im 55/55]
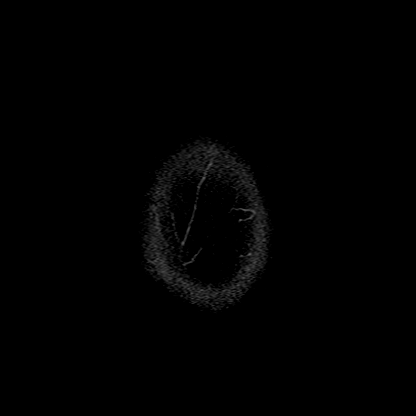

[Series 16: T1 · axial · 1.0mm · 0.98mm/px · z∈[-72,+82]mm · 8 of 160 slices shown (2 of 2)]
[im 1/160]
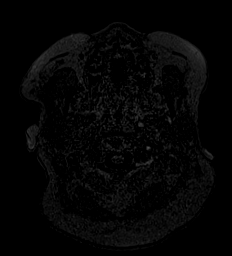
[im 20/160]
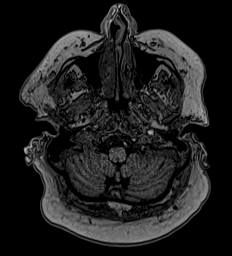
[im 40/160]
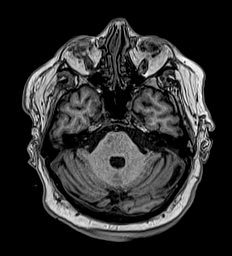
[im 60/160]
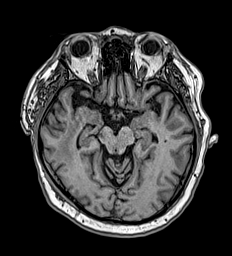
[im 100/160]
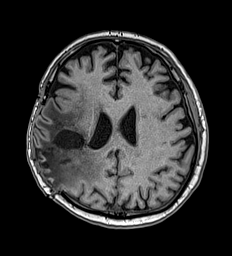
[im 120/160]
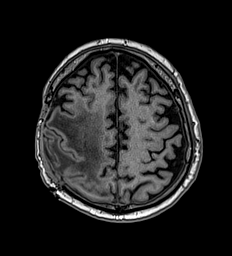
[im 140/160]
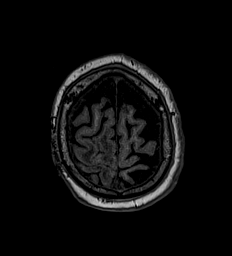
[im 160/160]
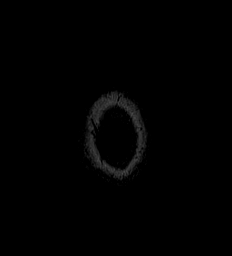

[Series 17: T2 post-contrast · coronal · 5.0mm · 0.57mm/px · 2 of 29 slices shown]
[im 1/29]
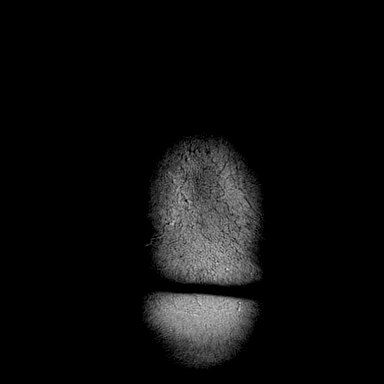
[im 29/29]
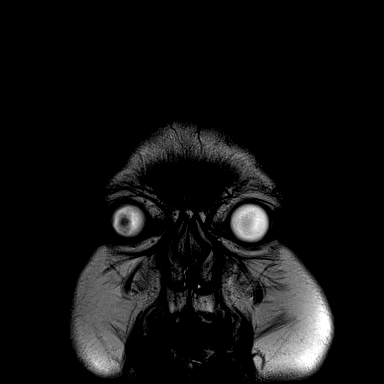

[Series 18: T1 post-contrast · axial · 1.0mm · 0.98mm/px · z∈[-72,+82]mm · 9 of 160 slices shown (1 of 3)]
[im 1/160]
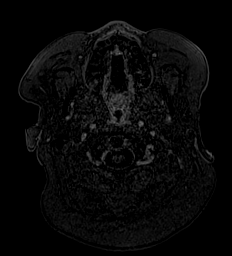
[im 20/160]
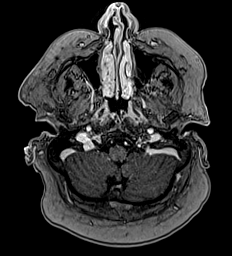
[im 40/160]
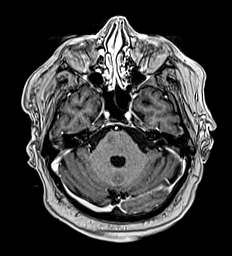
[im 60/160]
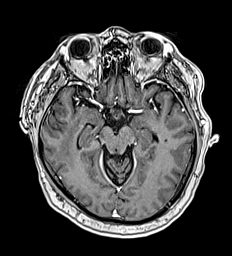
[im 80/160]
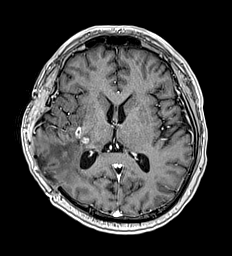
[im 100/160]
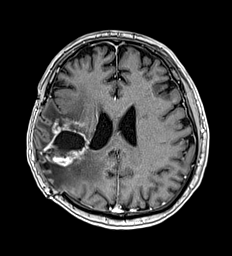
[im 120/160]
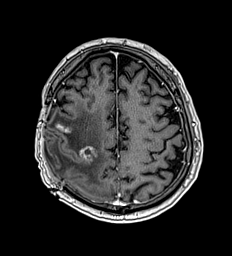
[im 140/160]
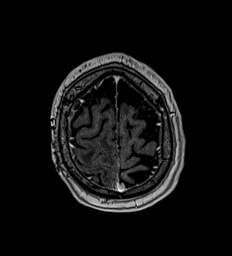
[im 160/160]
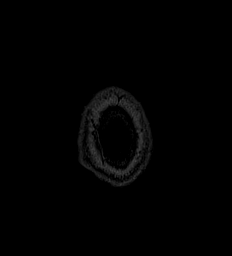

[Series 19: T1 post-contrast · coronal · 5.0mm · 0.57mm/px · 2 of 29 slices shown (2 of 3)]
[im 1/29]
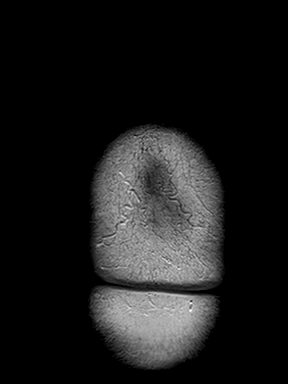
[im 29/29]
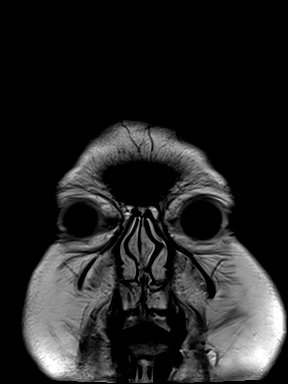

[Series 20: T1 post-contrast · sagittal · 5.0mm · 0.62mm/px · 1 of 25 slices shown (3 of 3)]
[im 1/25]
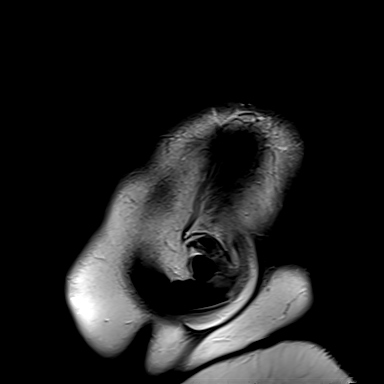

[47 of 48 positions shown; findings below may reference images not displayed]

FINDINGS: Brain: Status post prior right frontotemporal craniectomy with
subjacent resection cavity in the frontoparietal region. Around the
resection cavity again noted are multiple heterogeneously enhancing
masses, the largest of which is at the posterior aspect of the
resection cavity and measures up to 2.8 x 4.3 cm in the axial plane
(series 18, image 96), previously 2.7 x 3.5 cm when remeasured
similarly. The mass at the superomedial aspect of the resection
cavity measures up to 1.4 x 2.5 cm in the axial plane (series 18,
image 111), previously 1.3 x 2.2 cm. The mass anterior to the
resection cavity measures up to 2.0 x 2.8 cm (series 18, image 109),
previously 1.7 x 2.5 cm. A somewhat separate lesion anterior to the
resection cavity now measures 1.3 x 1.5 cm (series 18, image 93),
previously 1.1 x 1.1 cm. A focus of contrast enhancement
anteromedial to this now appears to be a lesion measuring up to
cm (series 18, image 95), previously ill-defined contrast.

Redemonstrated increased T2 hyperintense signal in the surrounding
parenchyma, which appears to extend more superiorly into the vertex
right frontal and parietal lobes (series 15, image 48). As well as
further into the right temporal lobe (series 15, image 24). There
appears to be increased herniation through the craniectomy defect
(series 15, image 30, compared to series 15, image 32 from the
03/29/2021 exam and series 15, image 30 from 03/02/2021).

No midline shift. No hydrocephalus or extra-axial collection. No
restricted diffusion to suggest acute infarct.

Vascular: Normal flow voids.

Skull and upper cervical spine: Prior right frontoparietal
craniectomy. Otherwise normal marrow signal.

Sinuses/Orbits: Mucous retention cysts and mucosal thickening in the
maxillary sinuses. The orbits are unremarkable.

Other: Trace fluid in the right-greater-than-left mastoid air cells.
IMPRESSION: 1. Interval increase in the size of multiple heterogeneously
enhancing masses about the right frontoparietal resection cavity,
with interval increase in surrounding T2 hyperintense signal,
concerning for tumor progression.
2. No midline shift, however there appears to be increased
herniation through the craniectomy defect.

## 2022-03-12 DEATH — deceased
